# Patient Record
Sex: Female | Born: 1955 | Race: Black or African American | Hispanic: No | Marital: Single | Smoking: Never smoker
Health system: Southern US, Community
[De-identification: ages and names within clinical notes are randomized; demographics above are authoritative.]

## PROBLEM LIST (undated history)

## (undated) DIAGNOSIS — I219 Acute myocardial infarction, unspecified: Secondary | ICD-10-CM

## (undated) DIAGNOSIS — J8410 Pulmonary fibrosis, unspecified: Secondary | ICD-10-CM

## (undated) DIAGNOSIS — E785 Hyperlipidemia, unspecified: Secondary | ICD-10-CM

## (undated) DIAGNOSIS — E119 Type 2 diabetes mellitus without complications: Secondary | ICD-10-CM

## (undated) DIAGNOSIS — I272 Pulmonary hypertension, unspecified: Secondary | ICD-10-CM

## (undated) DIAGNOSIS — I2510 Atherosclerotic heart disease of native coronary artery without angina pectoris: Secondary | ICD-10-CM

## (undated) DIAGNOSIS — E669 Obesity, unspecified: Secondary | ICD-10-CM

## (undated) DIAGNOSIS — M3320 Polymyositis, organ involvement unspecified: Secondary | ICD-10-CM

## (undated) DIAGNOSIS — J449 Chronic obstructive pulmonary disease, unspecified: Secondary | ICD-10-CM

## (undated) DIAGNOSIS — I509 Heart failure, unspecified: Secondary | ICD-10-CM

## (undated) DIAGNOSIS — I10 Essential (primary) hypertension: Secondary | ICD-10-CM

## (undated) HISTORY — PX: ABDOMINAL SURGERY: SHX537

## (undated) HISTORY — PX: TRACHEOSTOMY: SUR1362

---

## 2016-06-09 DIAGNOSIS — I219 Acute myocardial infarction, unspecified: Secondary | ICD-10-CM

## 2016-06-09 HISTORY — DX: Acute myocardial infarction, unspecified: I21.9

## 2016-12-19 ENCOUNTER — Encounter: Payer: Self-pay | Admitting: Emergency Medicine

## 2016-12-19 ENCOUNTER — Emergency Department: Payer: Medicare HMO

## 2016-12-19 ENCOUNTER — Emergency Department
Admission: EM | Admit: 2016-12-19 | Discharge: 2016-12-19 | Disposition: A | Discharge status: Home / Self Care | Payer: Medicare HMO | Attending: Emergency Medicine | Admitting: Emergency Medicine

## 2016-12-19 DIAGNOSIS — R0600 Dyspnea, unspecified: Secondary | ICD-10-CM | POA: Insufficient documentation

## 2016-12-19 DIAGNOSIS — I509 Heart failure, unspecified: Secondary | ICD-10-CM | POA: Diagnosis not present

## 2016-12-19 DIAGNOSIS — E119 Type 2 diabetes mellitus without complications: Secondary | ICD-10-CM | POA: Diagnosis not present

## 2016-12-19 DIAGNOSIS — J449 Chronic obstructive pulmonary disease, unspecified: Secondary | ICD-10-CM | POA: Diagnosis not present

## 2016-12-19 DIAGNOSIS — R0602 Shortness of breath: Secondary | ICD-10-CM | POA: Diagnosis present

## 2016-12-19 DIAGNOSIS — I110 Hypertensive heart disease with heart failure: Secondary | ICD-10-CM | POA: Insufficient documentation

## 2016-12-19 HISTORY — DX: Heart failure, unspecified: I50.9

## 2016-12-19 HISTORY — DX: Acute myocardial infarction, unspecified: I21.9

## 2016-12-19 HISTORY — DX: Type 2 diabetes mellitus without complications: E11.9

## 2016-12-19 HISTORY — DX: Essential (primary) hypertension: I10

## 2016-12-19 HISTORY — DX: Chronic obstructive pulmonary disease, unspecified: J44.9

## 2016-12-19 LAB — COMPREHENSIVE METABOLIC PANEL
ALBUMIN: 3.5 g/dL (ref 3.5–5.0)
ALT: 112 U/L — ABNORMAL HIGH (ref 14–54)
AST: 163 U/L — ABNORMAL HIGH (ref 15–41)
Alkaline Phosphatase: 73 U/L (ref 38–126)
Anion gap: 10 (ref 5–15)
BUN: 19 mg/dL (ref 6–20)
CO2: 23 mmol/L (ref 22–32)
Calcium: 9.1 mg/dL (ref 8.9–10.3)
Chloride: 108 mmol/L (ref 101–111)
Creatinine, Ser: 1.16 mg/dL — ABNORMAL HIGH (ref 0.44–1.00)
GFR calc Af Amer: 59 mL/min — ABNORMAL LOW (ref >60)
GFR calc non Af Amer: 50 mL/min — ABNORMAL LOW (ref >60)
GLUCOSE: 131 mg/dL — AB (ref 65–99)
POTASSIUM: 3.2 mmol/L — AB (ref 3.5–5.1)
SODIUM: 141 mmol/L (ref 135–145)
Total Bilirubin: 0.8 mg/dL (ref 0.3–1.2)
Total Protein: 7.1 g/dL (ref 6.5–8.1)

## 2016-12-19 LAB — URINALYSIS, COMPLETE (UACMP) WITH MICROSCOPIC
BILIRUBIN URINE: NEGATIVE
Glucose, UA: NEGATIVE mg/dL
Hgb urine dipstick: NEGATIVE
KETONES UR: NEGATIVE mg/dL
Leukocytes, UA: NEGATIVE
Nitrite: NEGATIVE
Protein, ur: 30 mg/dL — AB
Specific Gravity, Urine: 1.019 (ref 1.005–1.030)
pH: 6 (ref 5.0–8.0)

## 2016-12-19 LAB — TROPONIN I: Troponin I: <0.03 ng/mL (ref <0.03)

## 2016-12-19 LAB — CBC WITH DIFFERENTIAL/PLATELET
BASOS ABS: 0.1 10*3/uL (ref 0–0.1)
Basophils Relative: 1 %
EOS ABS: 0.3 10*3/uL (ref 0–0.7)
Eosinophils Relative: 2 %
HCT: 32 % — ABNORMAL LOW (ref 35.0–47.0)
HEMOGLOBIN: 10.4 g/dL — AB (ref 12.0–16.0)
LYMPHS ABS: 0.7 10*3/uL — AB (ref 1.0–3.6)
LYMPHS PCT: 5 %
MCH: 27.8 pg (ref 26.0–34.0)
MCHC: 32.7 g/dL (ref 32.0–36.0)
MCV: 85 fL (ref 80.0–100.0)
Monocytes Absolute: 0.6 10*3/uL (ref 0.2–0.9)
Monocytes Relative: 4 %
Neutro Abs: 12.8 10*3/uL — ABNORMAL HIGH (ref 1.4–6.5)
Neutrophils Relative %: 88 %
Platelets: 414 10*3/uL (ref 150–440)
RBC: 3.76 MIL/uL — AB (ref 3.80–5.20)
RDW: 19.5 % — ABNORMAL HIGH (ref 11.5–14.5)
WBC: 14.5 10*3/uL — ABNORMAL HIGH (ref 3.6–11.0)

## 2016-12-19 LAB — BRAIN NATRIURETIC PEPTIDE: B Natriuretic Peptide: 65 pg/mL (ref 0.0–100.0)

## 2016-12-19 LAB — FIBRIN DERIVATIVES D-DIMER (ARMC ONLY): FIBRIN DERIVATIVES D-DIMER (ARMC): 1318.79 — AB (ref 0.00–499.00)

## 2016-12-19 MED ORDER — IOPAMIDOL (ISOVUE-370) INJECTION 76%
75.0000 mL | Freq: Once | INTRAVENOUS | Status: AC | PRN
  Administered 2016-12-19: 75 mL via INTRAVENOUS

## 2016-12-19 MED ORDER — IPRATROPIUM-ALBUTEROL 0.5-2.5 (3) MG/3ML IN SOLN
3.0000 mL | Freq: Once | RESPIRATORY_TRACT | Status: AC
  Administered 2016-12-19: 3 mL via RESPIRATORY_TRACT

## 2016-12-19 MED ORDER — IPRATROPIUM-ALBUTEROL 0.5-2.5 (3) MG/3ML IN SOLN
RESPIRATORY_TRACT | Status: AC
  Administered 2016-12-19: 3 mL via RESPIRATORY_TRACT
  Filled 2016-12-19: qty 3

## 2016-12-19 MED ORDER — PREDNISONE 50 MG PO TABS: 50.0000 mg | ORAL_TABLET | Freq: Every day | ORAL | 0 refills | Status: DC

## 2016-12-19 NOTE — ED Triage Notes (Addendum)
Pt to ED via EMS from home, pt is on 3L [place] chronically for COPD, pt on 4L at 88% currently. Pt has productive cough and noted weakness. Pt A&OX4, MD at bedside. Pt from [place] visiting family.

## 2016-12-19 NOTE — ED Provider Notes (Signed)
I was asked to follow-up on patient's CT scan, no PE question of interstitial lung disease, patient has outpatient pulmonary follow-up arranged. She reports she feels significantly better and wants to go home. I offered admission but she says she feels better than she has in a while, she would prefer to go home and if she feels worse she states she will come right back. I feel this is reasonable, family also agrees with this plan   Jene Every, MD 12/19/16 1820

## 2016-12-19 NOTE — ED Provider Notes (Signed)
[facility] Emergency Department Provider Note   ____________________________________________   First MD Initiated Contact with Patient 12/19/16 1420     (approximate)  I have reviewed the triage vital signs and the nursing notes.   HISTORY  Chief Complaint Shortness of Breath    HPI Elizabeth Montes is a 61 y.o. female patient reports she is on 3  liters of oxygen normally for COPD. She came up from [place] to visit family yesterday and became short of breath today. She has a productive cough and feels weak. Here in the emergency room her O2 sats are 88% on 4 L. She feels weak and ill. Patient is coughing a much sure if there is anything coming up though. Patient is very tired and isn't speaking very well or loudly  Past Medical History:  Diagnosis Date  . CHF (congestive heart failure) (HCC)   . COPD (chronic obstructive pulmonary disease) (HCC)   . Diabetes mellitus without complication (HCC)   . Heart attack (HCC)   . Hypertension     There are no active problems to display for this patient.   Past Surgical History:  Procedure Laterality Date  . ABDOMINAL SURGERY    . TRACHEOSTOMY      Prior to Admission medications   Not on File    Allergies Patient has no known allergies.  No family history on file.  Social History Social History  Substance Use Topics  . Smoking status: Never Smoker  . Smokeless tobacco: Never Used  . Alcohol use No    Review of Systems  Constitutional: No fever/chills Eyes: No visual changes. ENT: No sore throat. Cardiovascular: Denies chest pain. Respiratory:  shortness of breath. Gastrointestinal: No abdominal pain.  No nausea, no vomiting.  No diarrhea.  No constipation. Genitourinary: Negative for dysuria. Musculoskeletal: Negative for back pain. Skin: Negative for rash. Neurological: Negative for headaches, focal weakness or numbness.   ____________________________________________   PHYSICAL  EXAM:  VITAL SIGNS: ED Triage Vitals  Enc Vitals Group     BP 12/19/16 1415 116/61     Pulse Rate 12/19/16 1415 94     Resp 12/19/16 1415 (!) 26     Temp 12/19/16 1416 98.3 F (36.8 C)     Temp Source 12/19/16 1416 Oral     SpO2 12/19/16 1415 90 %     Weight --      Height --      Head Circumference --      Peak Flow --      Pain Score --      Pain Loc --      Pain Edu? --      Excl. in GC? --     Constitutional: Alert and oriented.Looks tired and ill distress. Eyes: Conjunctivae are normal.  Head: Atraumatic. Nose: No congestion/rhinnorhea. Mouth/Throat: Mucous membranes are moist.  Oropharynx non-erythematous. Neck: No stridor. Cardiovascular: Normal rate, regular rhythm. Grossly normal heart sounds.  Good peripheral circulation. Respiratory: Somewhat increased and more rapid respiratory effort.  No retractions. Lungs CTAB. Gastrointestinal: Soft and nontender. No distention. No abdominal bruits. No CVA tenderness. Musculoskeletal: No lower extremity tenderness nor edema.  No joint effusions. Neurologic:  Normal speech and language. No gross focal neurologic deficits are appreciated. No gait instability. Skin:  Skin is warm, dry and intact. No rash noted.   ____________________________________________   LABS (all labs ordered are listed, but only abnormal results are displayed)  Labs Reviewed  COMPREHENSIVE METABOLIC PANEL - Abnormal; Notable  for the following:       Result Value   Potassium 3.2 (*)    Glucose, Bld 131 (*)    Creatinine, Ser 1.16 (*)    AST 163 (*)    ALT 112 (*)    GFR calc non Af Amer 50 (*)    GFR calc Af Amer 59 (*)    All other components within normal limits  CBC WITH DIFFERENTIAL/PLATELET - Abnormal; Notable for the following:    WBC 14.5 (*)    RBC 3.76 (*)    Hemoglobin 10.4 (*)    HCT 32.0 (*)    RDW 19.5 (*)    Neutro Abs 12.8 (*)    Lymphs Abs 0.7 (*)    All other components within normal limits  FIBRIN DERIVATIVES D-DIMER  ([facility] ONLY) - Abnormal; Notable for the following:    Fibrin derivatives D-dimer ([facility]) 1,318.79 (*)    All other components within normal limits  TROPONIN I  BRAIN NATRIURETIC PEPTIDE  URINALYSIS, COMPLETE (UACMP) WITH MICROSCOPIC   ____________________________________________  EKG  EKG read and interpreted by me shows normal sinus rhythm rate of 95 normal axis nonspecific ST-T wave changes ____________________________________________  RADIOLOGY  IMPRESSION: Scarring or atelectasis in the bases.  No acute abnormalities.   Electronically Signed   By: Gerome Sam III M.D   On: 12/19/2016 14:40 ____________________________________________   PROCEDURES  Procedure(s) performed:   Procedures  Critical Care performed:   ____________________________________________   INITIAL IMPRESSION / ASSESSMENT AND PLAN / ED COURSE  Pertinent labs & imaging results that were available during my care of the patient were reviewed by me and considered in my medical decision making (see chart for details).   Dr. Cyril Loosen will follow-up the patient's CT scan and dispose appropriati     ____________________________________________   FINAL CLINICAL IMPRESSION(S) / ED DIAGNOSES  Final diagnoses:  Dyspnea  Dyspnea, unspecified type      NEW MEDICATIONS STARTED DURING THIS VISIT:  New Prescriptions   No medications on file     Note:  This document was prepared using Dragon voice recognition software and may include unintentional dictation errors.    Arnaldo Natal, MD 12/19/16 [phone]

## 2016-12-19 NOTE — ED Notes (Signed)
Pt taken to car in w/c

## 2017-07-15 ENCOUNTER — Other Ambulatory Visit: Payer: Self-pay

## 2017-07-15 ENCOUNTER — Emergency Department (HOSPITAL_COMMUNITY): Payer: Medicare HMO

## 2017-07-15 ENCOUNTER — Inpatient Hospital Stay (HOSPITAL_COMMUNITY)
Admission: EM | Admit: 2017-07-15 | Discharge: 2017-07-18 | DRG: 291 | Disposition: A | Discharge status: Home / Self Care | Payer: Medicare HMO | Attending: Internal Medicine | Admitting: Internal Medicine

## 2017-07-15 ENCOUNTER — Encounter (HOSPITAL_COMMUNITY): Payer: Self-pay | Admitting: *Deleted

## 2017-07-15 DIAGNOSIS — Z794 Long term (current) use of insulin: Secondary | ICD-10-CM

## 2017-07-15 DIAGNOSIS — I252 Old myocardial infarction: Secondary | ICD-10-CM

## 2017-07-15 DIAGNOSIS — N182 Chronic kidney disease, stage 2 (mild): Secondary | ICD-10-CM

## 2017-07-15 DIAGNOSIS — R0602 Shortness of breath: Secondary | ICD-10-CM

## 2017-07-15 DIAGNOSIS — N179 Acute kidney failure, unspecified: Secondary | ICD-10-CM | POA: Diagnosis present

## 2017-07-15 DIAGNOSIS — N183 Chronic kidney disease, stage 3 (moderate): Secondary | ICD-10-CM | POA: Diagnosis present

## 2017-07-15 DIAGNOSIS — N189 Chronic kidney disease, unspecified: Secondary | ICD-10-CM

## 2017-07-15 DIAGNOSIS — D649 Anemia, unspecified: Secondary | ICD-10-CM | POA: Diagnosis present

## 2017-07-15 DIAGNOSIS — R739 Hyperglycemia, unspecified: Secondary | ICD-10-CM

## 2017-07-15 DIAGNOSIS — R7989 Other specified abnormal findings of blood chemistry: Secondary | ICD-10-CM

## 2017-07-15 DIAGNOSIS — I130 Hypertensive heart and chronic kidney disease with heart failure and stage 1 through stage 4 chronic kidney disease, or unspecified chronic kidney disease: Principal | ICD-10-CM | POA: Diagnosis present

## 2017-07-15 DIAGNOSIS — Z9981 Dependence on supplemental oxygen: Secondary | ICD-10-CM

## 2017-07-15 DIAGNOSIS — E1165 Type 2 diabetes mellitus with hyperglycemia: Secondary | ICD-10-CM | POA: Diagnosis present

## 2017-07-15 DIAGNOSIS — J449 Chronic obstructive pulmonary disease, unspecified: Secondary | ICD-10-CM | POA: Diagnosis present

## 2017-07-15 DIAGNOSIS — Z79899 Other long term (current) drug therapy: Secondary | ICD-10-CM

## 2017-07-15 DIAGNOSIS — R0902 Hypoxemia: Secondary | ICD-10-CM

## 2017-07-15 DIAGNOSIS — E1122 Type 2 diabetes mellitus with diabetic chronic kidney disease: Secondary | ICD-10-CM | POA: Diagnosis present

## 2017-07-15 DIAGNOSIS — Z888 Allergy status to other drugs, medicaments and biological substances status: Secondary | ICD-10-CM

## 2017-07-15 DIAGNOSIS — IMO0002 Reserved for concepts with insufficient information to code with codable children: Secondary | ICD-10-CM | POA: Diagnosis present

## 2017-07-15 DIAGNOSIS — D72829 Elevated white blood cell count, unspecified: Secondary | ICD-10-CM | POA: Diagnosis present

## 2017-07-15 DIAGNOSIS — I509 Heart failure, unspecified: Secondary | ICD-10-CM | POA: Diagnosis present

## 2017-07-15 DIAGNOSIS — I10 Essential (primary) hypertension: Secondary | ICD-10-CM | POA: Diagnosis present

## 2017-07-15 DIAGNOSIS — Z7952 Long term (current) use of systemic steroids: Secondary | ICD-10-CM

## 2017-07-15 DIAGNOSIS — M3320 Polymyositis, organ involvement unspecified: Secondary | ICD-10-CM | POA: Diagnosis present

## 2017-07-15 DIAGNOSIS — N289 Disorder of kidney and ureter, unspecified: Secondary | ICD-10-CM

## 2017-07-15 DIAGNOSIS — J9621 Acute and chronic respiratory failure with hypoxia: Secondary | ICD-10-CM | POA: Diagnosis present

## 2017-07-15 DIAGNOSIS — Z7982 Long term (current) use of aspirin: Secondary | ICD-10-CM

## 2017-07-15 MED ORDER — FUROSEMIDE 10 MG/ML IJ SOLN
80.0000 mg | Freq: Once | INTRAMUSCULAR | Status: AC
  Administered 2017-07-16: 80 mg via INTRAVENOUS
  Filled 2017-07-15: qty 8

## 2017-07-15 NOTE — ED Provider Notes (Signed)
[facility]ENC[facility] Provider Note   CSN: [id number] Arrival date & time: 07/15/17  2228     History   Chief Complaint Chief Complaint  Patient presents with  . Shortness of Breath    HPI Elizabeth Montes is a 61 y.o. female.  The history is provided by the patient.  She has history of diabetes, CHF, COPD, hypertension, prior MI.  She is visiting from [place] and states that this evening, her oxygen tank stopped working and she developed some shortness of breath.  She has been having a nonproductive cough which is not changed.  For the last several days, she has been having some chest tightness which she cannot clearly related to exertion.  Family member with her states that her breathing seemed fine until the oxygen stopped working [employer].  EMS reported severe hypoxia with O2 saturation of 65% which needed a nonrebreather to come up to normal.  EMS did give her a nebulizer treatment with albuterol and ipratropium and patient states she does feel little bit better.  Past Medical History:  Diagnosis Date  . CHF (congestive heart failure) (HCC)   . COPD (chronic obstructive pulmonary disease) (HCC)   . Diabetes mellitus without complication (HCC)   . Heart attack (HCC)   . Hypertension     There are no active problems to display for this patient.   Past Surgical History:  Procedure Laterality Date  . ABDOMINAL SURGERY    . TRACHEOSTOMY      OB History    No data available       Home Medications    Prior to Admission medications   Medication Sig Start Date End Date Taking? Authorizing Provider  predniSONE (DELTASONE) 50 MG tablet Take 1 tablet (50 mg total) by mouth daily with breakfast. 12/19/16   Jene EveryKinner, Robert, MD    Family History No family history on file.  Social History Social History   Tobacco Use  . Smoking status: Never Smoker  . Smokeless tobacco: Never Used  Substance Use Topics  . Alcohol use: No  . Drug use: No      Allergies   Patient has no known allergies.   Review of Systems Review of Systems  All other systems reviewed and are negative.    Physical Exam Updated Vital Signs BP (!) 136/51   Resp (!) 22   SpO2 94%   Physical Exam  Nursing note and vitals reviewed.  61 year old female, resting comfortably and in no acute distress. Vital signs are significant for tachycardia and tachypnea. Oxygen saturation is 94%, which is normal, but dropped down to 90% when placed back on nasal cannula oxygen. Head is normocephalic and atraumatic. PERRLA, EOMI. Oropharynx is clear. Neck is nontender and supple without adenopathy or JVD. Back is nontender and there is no CVA tenderness. Lungs have bibasilar rales without wheezes or rhonchi. Chest is nontender. Heart has regular rate and rhythm without murmur. Abdomen is soft, flat, nontender without masses or hepatosplenomegaly and peristalsis is normoactive. Extremities have 2+ pretibial edema with 1-2+ presacral edema, full range of motion is present. Skin is warm and dry without rash. Neurologic: Mental status is normal, cranial nerves are intact, there are no motor or sensory deficits.  ED Treatments / Results  Labs (all labs ordered are listed, but only abnormal results are displayed) Labs Reviewed  COMPREHENSIVE METABOLIC PANEL - Abnormal; Notable for the following components:      Result Value   Sodium 130 (*)  Chloride 97 (*)    CO2 21 (*)    Glucose, Bld 833 (*)    BUN 39 (*)    Creatinine, Ser 1.68 (*)    Albumin 3.3 (*)    GFR calc non Af Amer 32 (*)    GFR calc Af Amer 37 (*)    All other components within normal limits  BRAIN NATRIURETIC PEPTIDE - Abnormal; Notable for the following components:   B Natriuretic Peptide 138.1 (*)    All other components within normal limits  CBC WITH DIFFERENTIAL/PLATELET - Abnormal; Notable for the following components:   WBC 16.9 (*)    RBC 3.21 (*)    Hemoglobin 10.1 (*)    HCT 30.1  (*)    RDW 21.4 (*)    Platelets 448 (*)    Neutro Abs 12.8 (*)    Monocytes Absolute 1.2 (*)    All other components within normal limits  I-STAT TROPONIN, ED  I-STAT VENOUS BLOOD GAS, ED    EKG  EKG Interpretation  Date/Time:  Thursday July 15 2017 22:42:10 EST Ventricular Rate:  124 PR Interval:    QRS Duration: 93 QT Interval:  353 QTC Calculation: 507 R Axis:   7 Text Interpretation:  Sinus tachycardia Borderline ST depression, lateral leads Borderline prolonged QT interval When compared with ECG of 01/11/1996, HEART RATE has increased QT has lengthened Confirmed by Dione Booze ([id number]) on 07/15/2017 10:57:10 PM       Radiology Dg Chest 2 View  Result Date: 07/15/2017 CLINICAL DATA:  Shortness of breath and cough EXAM: CHEST  2 VIEW COMPARISON:  CTA chest 12/19/2016 FINDINGS: Diffuse interstitial prominence. No pleural effusion or pneumothorax. Bibasilar atelectasis. Normal cardiomediastinal contours. The tip of the left PICC line is at the cavoatrial junction. IMPRESSION: Chronic findings of COPD without focal airspace consolidation. Electronically Signed   By: Deatra Robinson M.D.   On: 07/15/2017 23:48    Procedures Procedures (including critical care time) CRITICAL CARE Performed by: Dione Booze Total critical care time: 35 minutes Critical care time was exclusive of separately billable procedures and treating other patients. Critical care was necessary to treat or prevent imminent or life-threatening deterioration. Critical care was time spent personally by me on the following activities: development of treatment plan with patient and/or surrogate as well as nursing, discussions with consultants, evaluation of patient's response to treatment, examination of patient, obtaining history from patient or surrogate, ordering and performing treatments and interventions, ordering and review of laboratory studies, ordering and review of radiographic studies, pulse oximetry  and re-evaluation of patient's condition.  Medications Ordered in ED Medications - No data to display   Initial Impression / Assessment and Plan / ED Course  I have reviewed the triage vital signs and the nursing notes.  Pertinent labs & imaging results that were available during my care of the patient were reviewed by me and considered in my medical decision making (see chart for details).  Acute dyspnea which appears to be combination of CHF and COPD.  My exam, CHF is predominant, but EMS did report wheezes and she did have some subjective improvement with nebulizer treatment in route.  Old records are reviewed, and she has 1 prior ED visit with similar presentation.  Will check chest x-ray to rule out pneumonia and also will check BNP.  She is given a dose of intravenous furosemide.  Laboratory workup shows severe hyperglycemia with mild metabolic acidosis and no increased anion gap.  Venous blood gas has been  ordered.  She is started on glucose stabilizer.  BNP is mildly elevated.  Because she is already fluid overloaded, IV fluids are not used as part of treatment of hyperglycemia.  With combination of hypoxia, hyperglycemia, and acute kidney injury, it is felt that she needed inpatient management.  Case is discussed with Dr. Antionette Char of [facility], who agrees to admit the patient.  Final Clinical Impressions(s) / ED Diagnoses   Final diagnoses:  Shortness of breath  Hypoxia  Acute on chronic renal insufficiency  Elevated brain natriuretic peptide (BNP) level  Hyperglycemia    ED Discharge Orders    None       Dione Booze, MD 07/16/17 Earle Gell

## 2017-07-15 NOTE — ED Notes (Signed)
IV team at bedside 

## 2017-07-15 NOTE — ED Triage Notes (Signed)
Pt from home c/o sob after walking upstairs. Initial oxygen sats 68% on room air, increased to 72% on [place], then increased to 96% on NRB; R sided lung sounds diminished, L sided lung sounds clear with wheezing. Hx of CHF, currently taking lasix. EMS gave 10mg  albuterol and 1mg  atrovent given enroute. On arrival. Pt ambulated with NRB in placed, pt significantly labored after ambulating with sats 75%.

## 2017-07-15 NOTE — ED Notes (Signed)
EMS attempted IV x3; port to upper L arm

## 2017-07-15 NOTE — ED Notes (Signed)
Pt requesting IV team   

## 2017-07-16 ENCOUNTER — Other Ambulatory Visit: Payer: Self-pay

## 2017-07-16 ENCOUNTER — Encounter (HOSPITAL_COMMUNITY): Payer: Self-pay | Admitting: Family Medicine

## 2017-07-16 ENCOUNTER — Inpatient Hospital Stay (HOSPITAL_COMMUNITY): Payer: Medicare HMO

## 2017-07-16 DIAGNOSIS — I252 Old myocardial infarction: Secondary | ICD-10-CM | POA: Diagnosis not present

## 2017-07-16 DIAGNOSIS — E1100 Type 2 diabetes mellitus with hyperosmolarity without nonketotic hyperglycemic-hyperosmolar coma (NKHHC): Secondary | ICD-10-CM

## 2017-07-16 DIAGNOSIS — J449 Chronic obstructive pulmonary disease, unspecified: Secondary | ICD-10-CM

## 2017-07-16 DIAGNOSIS — R0600 Dyspnea, unspecified: Secondary | ICD-10-CM | POA: Diagnosis not present

## 2017-07-16 DIAGNOSIS — R739 Hyperglycemia, unspecified: Secondary | ICD-10-CM | POA: Diagnosis not present

## 2017-07-16 DIAGNOSIS — D649 Anemia, unspecified: Secondary | ICD-10-CM | POA: Diagnosis present

## 2017-07-16 DIAGNOSIS — M3320 Polymyositis, organ involvement unspecified: Secondary | ICD-10-CM | POA: Diagnosis present

## 2017-07-16 DIAGNOSIS — I10 Essential (primary) hypertension: Secondary | ICD-10-CM | POA: Diagnosis not present

## 2017-07-16 DIAGNOSIS — Z888 Allergy status to other drugs, medicaments and biological substances status: Secondary | ICD-10-CM | POA: Diagnosis not present

## 2017-07-16 DIAGNOSIS — N289 Disorder of kidney and ureter, unspecified: Secondary | ICD-10-CM | POA: Diagnosis not present

## 2017-07-16 DIAGNOSIS — D72829 Elevated white blood cell count, unspecified: Secondary | ICD-10-CM | POA: Diagnosis present

## 2017-07-16 DIAGNOSIS — Z9981 Dependence on supplemental oxygen: Secondary | ICD-10-CM | POA: Diagnosis not present

## 2017-07-16 DIAGNOSIS — R0602 Shortness of breath: Secondary | ICD-10-CM | POA: Diagnosis not present

## 2017-07-16 DIAGNOSIS — N189 Chronic kidney disease, unspecified: Secondary | ICD-10-CM | POA: Diagnosis not present

## 2017-07-16 DIAGNOSIS — Z794 Long term (current) use of insulin: Secondary | ICD-10-CM | POA: Diagnosis not present

## 2017-07-16 DIAGNOSIS — I509 Heart failure, unspecified: Secondary | ICD-10-CM | POA: Diagnosis present

## 2017-07-16 DIAGNOSIS — I5033 Acute on chronic diastolic (congestive) heart failure: Secondary | ICD-10-CM | POA: Diagnosis not present

## 2017-07-16 DIAGNOSIS — Z79899 Other long term (current) drug therapy: Secondary | ICD-10-CM | POA: Diagnosis not present

## 2017-07-16 DIAGNOSIS — R0902 Hypoxemia: Secondary | ICD-10-CM | POA: Diagnosis not present

## 2017-07-16 DIAGNOSIS — N183 Chronic kidney disease, stage 3 (moderate): Secondary | ICD-10-CM | POA: Diagnosis present

## 2017-07-16 DIAGNOSIS — I130 Hypertensive heart and chronic kidney disease with heart failure and stage 1 through stage 4 chronic kidney disease, or unspecified chronic kidney disease: Secondary | ICD-10-CM | POA: Diagnosis present

## 2017-07-16 DIAGNOSIS — E1122 Type 2 diabetes mellitus with diabetic chronic kidney disease: Secondary | ICD-10-CM | POA: Diagnosis present

## 2017-07-16 DIAGNOSIS — N182 Chronic kidney disease, stage 2 (mild): Secondary | ICD-10-CM | POA: Diagnosis not present

## 2017-07-16 DIAGNOSIS — Z7982 Long term (current) use of aspirin: Secondary | ICD-10-CM | POA: Diagnosis not present

## 2017-07-16 DIAGNOSIS — J9621 Acute and chronic respiratory failure with hypoxia: Secondary | ICD-10-CM

## 2017-07-16 DIAGNOSIS — Z7952 Long term (current) use of systemic steroids: Secondary | ICD-10-CM | POA: Diagnosis not present

## 2017-07-16 DIAGNOSIS — IMO0002 Reserved for concepts with insufficient information to code with codable children: Secondary | ICD-10-CM | POA: Diagnosis present

## 2017-07-16 DIAGNOSIS — E1165 Type 2 diabetes mellitus with hyperglycemia: Secondary | ICD-10-CM | POA: Diagnosis present

## 2017-07-16 DIAGNOSIS — N179 Acute kidney failure, unspecified: Secondary | ICD-10-CM | POA: Diagnosis present

## 2017-07-16 DIAGNOSIS — R7989 Other specified abnormal findings of blood chemistry: Secondary | ICD-10-CM | POA: Diagnosis not present

## 2017-07-16 LAB — BASIC METABOLIC PANEL
ANION GAP: 14 (ref 5–15)
ANION GAP: 12 (ref 5–15)
Anion gap: 11 (ref 5–15)
Anion gap: 13 (ref 5–15)
BUN: 40 mg/dL — ABNORMAL HIGH (ref 6–20)
BUN: 35 mg/dL — ABNORMAL HIGH (ref 6–20)
BUN: 35 mg/dL — ABNORMAL HIGH (ref 6–20)
BUN: 34 mg/dL — ABNORMAL HIGH (ref 6–20)
CALCIUM: 9.4 mg/dL (ref 8.9–10.3)
CALCIUM: 9.4 mg/dL (ref 8.9–10.3)
CHLORIDE: 96 mmol/L — AB (ref 101–111)
CHLORIDE: 100 mmol/L — AB (ref 101–111)
CHLORIDE: 103 mmol/L (ref 101–111)
CHLORIDE: 101 mmol/L (ref 101–111)
CO2: 22 mmol/L (ref 22–32)
CO2: 24 mmol/L (ref 22–32)
CO2: 20 mmol/L — ABNORMAL LOW (ref 22–32)
CO2: 23 mmol/L (ref 22–32)
CREATININE: 1.52 mg/dL — AB (ref 0.44–1.00)
CREATININE: 1.29 mg/dL — AB (ref 0.44–1.00)
CREATININE: 1.33 mg/dL — AB (ref 0.44–1.00)
CREATININE: 1.36 mg/dL — AB (ref 0.44–1.00)
Calcium: 9.1 mg/dL (ref 8.9–10.3)
Calcium: 9.1 mg/dL (ref 8.9–10.3)
GFR calc non Af Amer: 36 mL/min — ABNORMAL LOW (ref >60)
GFR calc non Af Amer: 44 mL/min — ABNORMAL LOW (ref >60)
GFR calc non Af Amer: 43 mL/min — ABNORMAL LOW (ref >60)
GFR calc non Af Amer: 42 mL/min — ABNORMAL LOW (ref >60)
GFR, EST AFRICAN AMERICAN: 42 mL/min — AB (ref >60)
GFR, EST AFRICAN AMERICAN: 51 mL/min — AB (ref >60)
GFR, EST AFRICAN AMERICAN: 50 mL/min — AB (ref >60)
GFR, EST AFRICAN AMERICAN: 48 mL/min — AB (ref >60)
Glucose, Bld: 683 mg/dL (ref 65–99)
Glucose, Bld: 337 mg/dL — ABNORMAL HIGH (ref 65–99)
Glucose, Bld: 252 mg/dL — ABNORMAL HIGH (ref 65–99)
Glucose, Bld: 140 mg/dL — ABNORMAL HIGH (ref 65–99)
Potassium: 3.8 mmol/L (ref 3.5–5.1)
Potassium: 3.6 mmol/L (ref 3.5–5.1)
Potassium: 4.8 mmol/L (ref 3.5–5.1)
Potassium: 4.1 mmol/L (ref 3.5–5.1)
SODIUM: 132 mmol/L — AB (ref 135–145)
SODIUM: 135 mmol/L (ref 135–145)
SODIUM: 136 mmol/L (ref 135–145)
SODIUM: 136 mmol/L (ref 135–145)

## 2017-07-16 LAB — CBG MONITORING, ED
GLUCOSE-CAPILLARY: 501 mg/dL — AB (ref 65–99)
GLUCOSE-CAPILLARY: 341 mg/dL — AB (ref 65–99)
GLUCOSE-CAPILLARY: 193 mg/dL — AB (ref 65–99)
GLUCOSE-CAPILLARY: 390 mg/dL — AB (ref 65–99)
GLUCOSE-CAPILLARY: 330 mg/dL — AB (ref 65–99)
Glucose-Capillary: >600 mg/dL (ref 65–99)
Glucose-Capillary: >600 mg/dL (ref 65–99)
Glucose-Capillary: 501 mg/dL (ref 65–99)
Glucose-Capillary: 412 mg/dL — ABNORMAL HIGH (ref 65–99)
Glucose-Capillary: 249 mg/dL — ABNORMAL HIGH (ref 65–99)
Glucose-Capillary: 195 mg/dL — ABNORMAL HIGH (ref 65–99)

## 2017-07-16 LAB — URINALYSIS, ROUTINE W REFLEX MICROSCOPIC
Bilirubin Urine: NEGATIVE
HGB URINE DIPSTICK: NEGATIVE
Ketones, ur: NEGATIVE mg/dL
Leukocytes, UA: NEGATIVE
Nitrite: NEGATIVE
Protein, ur: NEGATIVE mg/dL
SPECIFIC GRAVITY, URINE: 1.01 (ref 1.005–1.030)
pH: 5 (ref 5.0–8.0)

## 2017-07-16 LAB — I-STAT TROPONIN, ED: TROPONIN I, POC: 0.01 ng/mL (ref 0.00–0.08)

## 2017-07-16 LAB — CBC WITH DIFFERENTIAL/PLATELET
BASOS ABS: 0 10*3/uL (ref 0.0–0.1)
Basophils Relative: 0 %
EOS PCT: 0 %
Eosinophils Absolute: 0 10*3/uL (ref 0.0–0.7)
HEMATOCRIT: 30.1 % — AB (ref 36.0–46.0)
HEMOGLOBIN: 10.1 g/dL — AB (ref 12.0–15.0)
LYMPHS ABS: 2.9 10*3/uL (ref 0.7–4.0)
LYMPHS PCT: 17 %
MCH: 31.5 pg (ref 26.0–34.0)
MCHC: 33.6 g/dL (ref 30.0–36.0)
MCV: 93.8 fL (ref 78.0–100.0)
MONOS PCT: 7 %
Monocytes Absolute: 1.2 10*3/uL — ABNORMAL HIGH (ref 0.1–1.0)
NEUTROS ABS: 12.8 10*3/uL — AB (ref 1.7–7.7)
Neutrophils Relative %: 76 %
Platelets: 448 10*3/uL — ABNORMAL HIGH (ref 150–400)
RBC: 3.21 MIL/uL — ABNORMAL LOW (ref 3.87–5.11)
RDW: 21.4 % — AB (ref 11.5–15.5)
WBC: 16.9 10*3/uL — ABNORMAL HIGH (ref 4.0–10.5)

## 2017-07-16 LAB — COMPREHENSIVE METABOLIC PANEL
ALBUMIN: 3.3 g/dL — AB (ref 3.5–5.0)
ALT: 45 U/L (ref 14–54)
AST: 38 U/L (ref 15–41)
Alkaline Phosphatase: 69 U/L (ref 38–126)
Anion gap: 12 (ref 5–15)
BUN: 39 mg/dL — AB (ref 6–20)
CHLORIDE: 97 mmol/L — AB (ref 101–111)
CO2: 21 mmol/L — AB (ref 22–32)
CREATININE: 1.68 mg/dL — AB (ref 0.44–1.00)
Calcium: 9.2 mg/dL (ref 8.9–10.3)
GFR calc Af Amer: 37 mL/min — ABNORMAL LOW (ref >60)
GFR calc non Af Amer: 32 mL/min — ABNORMAL LOW (ref >60)
GLUCOSE: 833 mg/dL — AB (ref 65–99)
POTASSIUM: 4 mmol/L (ref 3.5–5.1)
SODIUM: 130 mmol/L — AB (ref 135–145)
Total Bilirubin: 0.8 mg/dL (ref 0.3–1.2)
Total Protein: 6.8 g/dL (ref 6.5–8.1)

## 2017-07-16 LAB — I-STAT VENOUS BLOOD GAS, ED
Bicarbonate: 27.1 mmol/L (ref 20.0–28.0)
O2 SAT: 78 %
PCO2 VEN: 52.5 mmHg (ref 44.0–60.0)
PO2 VEN: 47 mmHg — AB (ref 32.0–45.0)
TCO2: 29 mmol/L (ref 22–32)
pH, Ven: 7.321 (ref 7.250–7.430)

## 2017-07-16 LAB — BRAIN NATRIURETIC PEPTIDE: B Natriuretic Peptide: 138.1 pg/mL — ABNORMAL HIGH (ref 0.0–100.0)

## 2017-07-16 LAB — GLUCOSE, CAPILLARY
GLUCOSE-CAPILLARY: 142 mg/dL — AB (ref 65–99)
GLUCOSE-CAPILLARY: 270 mg/dL — AB (ref 65–99)
Glucose-Capillary: 115 mg/dL — ABNORMAL HIGH (ref 65–99)
Glucose-Capillary: 123 mg/dL — ABNORMAL HIGH (ref 65–99)
Glucose-Capillary: 219 mg/dL — ABNORMAL HIGH (ref 65–99)
Glucose-Capillary: 232 mg/dL — ABNORMAL HIGH (ref 65–99)
Glucose-Capillary: 291 mg/dL — ABNORMAL HIGH (ref 65–99)

## 2017-07-16 LAB — ECHOCARDIOGRAM COMPLETE
Height: 63 in
WEIGHTICAEL: 2928 [oz_av]

## 2017-07-16 LAB — URINALYSIS, MICROSCOPIC (REFLEX): RBC / HPF: NONE SEEN RBC/hpf (ref 0–5)

## 2017-07-16 LAB — HIV ANTIBODY (ROUTINE TESTING W REFLEX): HIV Screen 4th Generation wRfx: NONREACTIVE

## 2017-07-16 LAB — MRSA PCR SCREENING: MRSA by PCR: POSITIVE — AB

## 2017-07-16 LAB — MAGNESIUM: MAGNESIUM: 2 mg/dL (ref 1.7–2.4)

## 2017-07-16 MED ORDER — METOPROLOL TARTRATE 25 MG PO TABS
25.0000 mg | ORAL_TABLET | Freq: Two times a day (BID) | ORAL | Status: DC
  Administered 2017-07-16 – 2017-07-18 (×5): 25 mg via ORAL
  Filled 2017-07-16 (×5): qty 1

## 2017-07-16 MED ORDER — SODIUM CHLORIDE 0.9% FLUSH: 10.0000 mL | INTRAVENOUS | Status: DC | PRN

## 2017-07-16 MED ORDER — ONDANSETRON HCL 4 MG/2ML IJ SOLN: 4.0000 mg | Freq: Four times a day (QID) | INTRAMUSCULAR | Status: DC | PRN

## 2017-07-16 MED ORDER — SODIUM CHLORIDE 0.9 % IV SOLN
INTRAVENOUS | Status: DC
  Administered 2017-07-16: 5.4 [IU]/h via INTRAVENOUS
  Administered 2017-07-16: 4.4 [IU]/h via INTRAVENOUS
  Administered 2017-07-16: 2.8 [IU]/h via INTRAVENOUS
  Filled 2017-07-16 (×3): qty 1

## 2017-07-16 MED ORDER — FUROSEMIDE 10 MG/ML IJ SOLN
80.0000 mg | Freq: Two times a day (BID) | INTRAMUSCULAR | Status: DC
  Administered 2017-07-16 – 2017-07-17 (×3): 80 mg via INTRAVENOUS
  Filled 2017-07-16 (×3): qty 8

## 2017-07-16 MED ORDER — IPRATROPIUM-ALBUTEROL 0.5-2.5 (3) MG/3ML IN SOLN
3.0000 mL | Freq: Four times a day (QID) | RESPIRATORY_TRACT | Status: DC
  Administered 2017-07-16 – 2017-07-17 (×5): 3 mL via RESPIRATORY_TRACT
  Filled 2017-07-16 (×5): qty 3

## 2017-07-16 MED ORDER — POTASSIUM CHLORIDE 10 MEQ/100ML IV SOLN
10.0000 meq | INTRAVENOUS | Status: AC
  Administered 2017-07-16 (×2): 10 meq via INTRAVENOUS
  Filled 2017-07-16 (×2): qty 100

## 2017-07-16 MED ORDER — SODIUM CHLORIDE 0.9 % IV SOLN
INTRAVENOUS | Status: DC
  Filled 2017-07-16: qty 1

## 2017-07-16 MED ORDER — AZATHIOPRINE 50 MG PO TABS
25.0000 mg | ORAL_TABLET | Freq: Every day | ORAL | Status: DC
  Administered 2017-07-17 – 2017-07-18 (×2): 25 mg via ORAL
  Filled 2017-07-16 (×2): qty 1

## 2017-07-16 MED ORDER — OXYCODONE-ACETAMINOPHEN 5-325 MG PO TABS
1.0000 | ORAL_TABLET | ORAL | Status: DC | PRN
  Administered 2017-07-16 – 2017-07-18 (×4): 1 via ORAL
  Filled 2017-07-16 (×4): qty 1

## 2017-07-16 MED ORDER — MUPIROCIN 2 % EX OINT
1.0000 "application " | TOPICAL_OINTMENT | Freq: Two times a day (BID) | CUTANEOUS | Status: DC
  Administered 2017-07-16 – 2017-07-18 (×4): 1 via NASAL
  Filled 2017-07-16: qty 22

## 2017-07-16 MED ORDER — SODIUM CHLORIDE 0.9% FLUSH
3.0000 mL | Freq: Two times a day (BID) | INTRAVENOUS | Status: DC
  Administered 2017-07-16: 3 mL via INTRAVENOUS
  Administered 2017-07-17: 09:00:00 via INTRAVENOUS
  Administered 2017-07-17 – 2017-07-18 (×2): 3 mL via INTRAVENOUS

## 2017-07-16 MED ORDER — TAMSULOSIN HCL 0.4 MG PO CAPS
0.4000 mg | ORAL_CAPSULE | Freq: Every day | ORAL | Status: DC
  Administered 2017-07-16 – 2017-07-17 (×2): 0.4 mg via ORAL
  Filled 2017-07-16 (×2): qty 1

## 2017-07-16 MED ORDER — ATORVASTATIN CALCIUM 80 MG PO TABS
80.0000 mg | ORAL_TABLET | Freq: Every day | ORAL | Status: DC
  Administered 2017-07-16 – 2017-07-18 (×3): 80 mg via ORAL
  Filled 2017-07-16 (×5): qty 1

## 2017-07-16 MED ORDER — FENOFIBRATE 160 MG PO TABS
160.0000 mg | ORAL_TABLET | Freq: Every day | ORAL | Status: DC
  Filled 2017-07-16 (×2): qty 1

## 2017-07-16 MED ORDER — AMLODIPINE BESYLATE 10 MG PO TABS
10.0000 mg | ORAL_TABLET | Freq: Every day | ORAL | Status: DC
  Administered 2017-07-16 – 2017-07-18 (×3): 10 mg via ORAL
  Filled 2017-07-16: qty 2
  Filled 2017-07-16 (×2): qty 1

## 2017-07-16 MED ORDER — CHLORHEXIDINE GLUCONATE CLOTH 2 % EX PADS: 6.0000 | MEDICATED_PAD | Freq: Every day | CUTANEOUS | Status: DC

## 2017-07-16 MED ORDER — ACETAMINOPHEN 325 MG PO TABS: 650.0000 mg | ORAL_TABLET | ORAL | Status: DC | PRN

## 2017-07-16 MED ORDER — ASPIRIN EC 81 MG PO TBEC
81.0000 mg | DELAYED_RELEASE_TABLET | Freq: Every day | ORAL | Status: DC
  Administered 2017-07-16 – 2017-07-18 (×3): 81 mg via ORAL
  Filled 2017-07-16 (×3): qty 1

## 2017-07-16 MED ORDER — PREDNISONE 20 MG PO TABS
40.0000 mg | ORAL_TABLET | Freq: Every day | ORAL | Status: DC
  Administered 2017-07-16 – 2017-07-17 (×2): 40 mg via ORAL
  Filled 2017-07-16 (×2): qty 2

## 2017-07-16 MED ORDER — FENOFIBRATE 160 MG PO TABS
160.0000 mg | ORAL_TABLET | Freq: Every day | ORAL | Status: DC
  Administered 2017-07-17 – 2017-07-18 (×2): 160 mg via ORAL
  Filled 2017-07-16 (×2): qty 1

## 2017-07-16 MED ORDER — SODIUM CHLORIDE 0.9 % IV SOLN
INTRAVENOUS | Status: DC
  Administered 2017-07-16: 05:00:00 via INTRAVENOUS

## 2017-07-16 MED ORDER — SODIUM CHLORIDE 0.9 % IV SOLN: 250.0000 mL | INTRAVENOUS | Status: DC | PRN

## 2017-07-16 MED ORDER — SODIUM CHLORIDE 0.9% FLUSH: 3.0000 mL | INTRAVENOUS | Status: DC | PRN

## 2017-07-16 MED ORDER — METOCLOPRAMIDE HCL 5 MG PO TABS
5.0000 mg | ORAL_TABLET | Freq: Three times a day (TID) | ORAL | Status: DC
  Administered 2017-07-16 – 2017-07-18 (×8): 5 mg via ORAL
  Filled 2017-07-16 (×8): qty 1

## 2017-07-16 MED ORDER — DEXTROSE-NACL 5-0.45 % IV SOLN
INTRAVENOUS | Status: DC
  Administered 2017-07-16: 12:00:00 via INTRAVENOUS

## 2017-07-16 MED ORDER — AZATHIOPRINE 50 MG PO TABS
25.0000 mg | ORAL_TABLET | Freq: Every day | ORAL | Status: DC
  Filled 2017-07-16 (×2): qty 1

## 2017-07-16 NOTE — ED Notes (Signed)
Contact daughter: Karen Kays @ [phone] with any concerns.

## 2017-07-16 NOTE — ED Notes (Signed)
Heart Healthy/Carb Modified diet breakfast tray ordered @ 0727.

## 2017-07-16 NOTE — Progress Notes (Signed)
  Echocardiogram 2D Echocardiogram has been performed.  Elizabeth Montes T Elizabeth Montes 07/16/2017, 1:25 PM

## 2017-07-16 NOTE — ED Notes (Signed)
Pt's CBG 193.  Informed Crystal, RN.

## 2017-07-16 NOTE — H&P (Signed)
History and Physical    Elizabeth ChannelLinda F Mood XLK:[id number] DOB: 04/10/1956 DOA: 07/15/2017  PCP: Gabriel RainwaterWendi Lovenvirth, MD [place], [place])   Patient coming from: Home  Chief Complaint: SOB, hypoxia   HPI: Elizabeth Montes is a 61 y.o. 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  [place] from [place] she was admitted to the hospital 2 weeks ago for acute on chronic hypoxic respiratory failure suspected secondary to exacerbation and COPD.  She reports that she was well and was walking up some stairs when she became acutely dyspneic EMS was called, found the patient to be saturating 68%.  This improved to 96% with nonrebreather and she was treated with DuoNeb in route to the hospital.  Patient reports that she was doing well until her oxygen tank stopped working.  She requires 5 L/min of supplemental oxygen around-the-clock at home.  Denies any significant chest pain, increase in her chronic cough, or increased sputum production.  Denies any fevers or chills.  Denies lower extremity swelling or tenderness.  ED Course: Upon arrival to the ED, patient is found to be saturating 90% on nonrebreather, tachypneic with 27 breaths/min, tachycardic in the 120s, and with stable blood pressure.  EKG features a sinus tachycardia with rate 124 and QTc interval of 507 ms.  Chest x-ray is notable for chronic changes of COPD without focal consolidation.  Chemistry panel reveals a glucose of 833, sodium 130, bicarbonate 21, and creatinine 1.68, up from 1.15 two weeks ago.  CBC is notable for leukocytosis to 16,900 and a normocytic anemia with hemoglobin of 10.1, down from 13.5 earlier this month.  Troponin is within the normal limits and BNP is slightly elevated 238.  Patient was started on insulin infusion and treated with 80 mg IV Lasix in  the she remains slightly tachypneic and tachycardic, but seems to be improving and blood pressure remained stable.  She will be admitted to the stepdown unit for ongoing evaluation and management of acute on chronic hypoxic respiratory failure, suspected secondary to acute on chronic CHF, as well as insulin dependent diabetes with HHS.  Review of Systems:  All other systems reviewed and apart from HPI, are negative.  Past Medical History:  Diagnosis Date  . CHF (congestive heart failure) (HCC)   . COPD (chronic obstructive pulmonary disease) (HCC)   . Diabetes mellitus without complication (HCC)   . Heart attack (HCC)   . Hypertension     Past Surgical History:  Procedure Laterality Date  . ABDOMINAL SURGERY    . TRACHEOSTOMY       reports that  has never smoked. she has never used smokeless tobacco. She reports that she does not drink alcohol or use drugs.  Allergies  Allergen Reactions  . Lovenox [Enoxaparin Sodium] Other (See Comments)    "made me bleed"    History reviewed. No pertinent family history.   Prior to Admission medications   Medication Sig Start Date End Date Taking? Authorizing Provider  amLODipine (NORVASC) 10 MG tablet Take 10 mg by mouth daily.   Yes [provider]  aspirin EC 81 MG tablet Take 81 mg by mouth daily.   Yes [provider]  atorvastatin (LIPITOR) 80 MG tablet Take 80 mg by mouth daily.   Yes [provider]  azaTHIOprine (IMURAN) 50 MG tablet Take 25 mg by mouth daily.  Yes [provider]  fenofibrate 160 MG tablet Take 160 mg by mouth daily.   Yes [provider]  furosemide (LASIX) 80 MG tablet Take 80 mg by mouth daily.   Yes [provider]  insulin aspart (NOVOLOG FLEXPEN) 100 UNIT/ML FlexPen Inject 25 Units into the skin 3 (three) times daily with meals.   Yes [provider]  Insulin Glargine (TOUJEO SOLOSTAR) 300 UNIT/ML SOPN Inject 30 Units into the skin every morning.    Yes [provider]  metoCLOPramide (REGLAN) 10 MG tablet Take 5 mg by mouth 3 (three) times daily before meals.   Yes [provider]  metoprolol tartrate (LOPRESSOR) 25 MG tablet Take 25 mg by mouth 2 (two) times daily.   Yes [provider]  oxyCODONE-acetaminophen (PERCOCET/ROXICET) 5-325 MG tablet Take 1 tablet by mouth every 4 (four) hours as needed for severe pain.   Yes [provider]  predniSONE (DELTASONE) 50 MG tablet Take 1 tablet (50 mg total) by mouth daily with breakfast. Patient taking differently: Take 40 mg by mouth daily with breakfast.  12/19/16  Yes Jene Every, MD  tamsulosin (FLOMAX) 0.4 MG CAPS capsule Take 0.4 mg by mouth daily after supper.   Yes [provider]  tiotropium (SPIRIVA HANDIHALER) 18 MCG inhalation capsule Place 18 mcg into inhaler and inhale daily.   Yes [provider]    Physical Exam: Vitals:   07/16/17 0000 07/16/17 0015 07/16/17 0030 07/16/17 0100  BP: (!) 134/49 (!) 118/49 (!) 136/57 124/60  Pulse: (!) 112 (!) 114 (!) 109 (!) 109  Resp: (!) 28 19 (!) 21 (!) 27  SpO2: 100% 94% 100% 100%      Constitutional: Mild tachypnea, dyspnea with speech, calm  Eyes: PERTLA, lids and conjunctivae normal ENMT: Mucous membranes are moist. Posterior pharynx clear of any exudate or lesions.   Neck: normal, supple, no masses, no thyromegaly Respiratory: Breath sounds diminished bilaterally with fine rales on right > left. Mild dyspnea with speech. No accessory muscle use.  Cardiovascular: Rate ~110 and regular. No extremity edema. JVP 9 mmH2O. Abdomen: No distension, no tenderness, no masses palpated. Bowel sounds normal.  Musculoskeletal: no clubbing / cyanosis. No joint deformity upper and lower extremities.  Skin: no significant rashes, lesions, ulcers. Warm, dry, well-perfused. Neurologic: CN 2-12 grossly intact. Sensation intact. Strength 5/5 in all 4 limbs.  Psychiatric: Alert and oriented x  3. Calm, cooperative.     Labs on Admission: I have personally reviewed following labs and imaging studies  CBC: Recent Labs  Lab 07/16/17 0020  WBC 16.9*  NEUTROABS 12.8*  HGB 10.1*  HCT 30.1*  MCV 93.8  PLT 448*   Basic Metabolic Panel: Recent Labs  Lab 07/16/17 0020  NA 130*  K 4.0  CL 97*  CO2 21*  GLUCOSE 833*  BUN 39*  CREATININE 1.68*  CALCIUM 9.2   GFR: CrCl cannot be calculated (Unknown ideal weight.). Liver Function Tests: Recent Labs  Lab 07/16/17 0020  AST 38  ALT 45  ALKPHOS 69  BILITOT 0.8  PROT 6.8  ALBUMIN 3.3*   No results for input(s): LIPASE, AMYLASE in the last 168 hours. No results for input(s): AMMONIA in the last 168 hours. Coagulation Profile: No results for input(s): INR, PROTIME in the last 168 hours. Cardiac Enzymes: No results for input(s): CKTOTAL, CKMB, CKMBINDEX, TROPONINI in the last 168 hours. BNP (last 3 results) No results for input(s): PROBNP in the last 8760 hours. HbA1C: No results for  input(s): HGBA1C in the last 72 hours. CBG: No results for input(s): GLUCAP in the last 168 hours. Lipid Profile: No results for input(s): CHOL, HDL, LDLCALC, TRIG, CHOLHDL, LDLDIRECT in the last 72 hours. Thyroid Function Tests: No results for input(s): TSH, T4TOTAL, FREET4, T3FREE, THYROIDAB in the last 72 hours. Anemia Panel: No results for input(s): VITAMINB12, FOLATE, FERRITIN, TIBC, IRON, RETICCTPCT in the last 72 hours. Urine analysis:    Component Value Date/Time   COLORURINE YELLOW (A) 12/19/2016 1722   APPEARANCEUR HAZY (A) 12/19/2016 1722   LABSPEC 1.019 12/19/2016 1722   PHURINE 6.0 12/19/2016 1722   GLUCOSEU NEGATIVE 12/19/2016 1722   HGBUR NEGATIVE 12/19/2016 1722   BILIRUBINUR NEGATIVE 12/19/2016 1722   KETONESUR NEGATIVE 12/19/2016 1722   PROTEINUR 30 (A) 12/19/2016 1722   NITRITE NEGATIVE 12/19/2016 1722   LEUKOCYTESUR NEGATIVE 12/19/2016 1722   Sepsis Labs: @LABRCNTIP (procalcitonin:4,lacticidven:4) )No  results found for this or any previous visit (from the past 240 hour(s)).   Radiological Exams on Admission: Dg Chest 2 View  Result Date: 07/15/2017 CLINICAL DATA:  Shortness of breath and cough EXAM: CHEST  2 VIEW COMPARISON:  CTA chest 12/19/2016 FINDINGS: Diffuse interstitial prominence. No pleural effusion or pneumothorax. Bibasilar atelectasis. Normal cardiomediastinal contours. The tip of the left PICC line is at the cavoatrial junction. IMPRESSION: Chronic findings of COPD without focal airspace consolidation. Electronically Signed   By: Deatra Robinson M.D.   On: 07/15/2017 23:48    EKG: Independently reviewed. Sinus tachycardia (rate 124), QTc 507 ms.   Assessment/Plan  1. Acute on chronic CHF; acute on chronic hypoxic respiratory failure  - Pt presents with acute distress, acute on chronic hypoxic respiratory failure  - No focal consolidation on CXR, no significant peripheral edema, but JVP elevated and rales appreciated  - She was treated in ED with Lasix 80 mg IV and has begun diuresing  - Continue cardiac monitoring, SLIV, fluid-restrict diet, follow daily wts and I/O's, continue diuresis with Lasix 80 mg IV q12h, continue beta-blocker, monitor renal fxn and lytes   2. COPD  - No wheezing on admission; pt denies increased cough or sputum production   - Continue supplemental O2, nebs    3. Uncontrolled IDDM; HHS  - A1c was 10.7% earlier this month  - Managed at home with Toujeo 30 units qAM, Novolog 25 units TID  - Serum glucose is 833 on admission with normal AG and no urine ketones; no coma  - Continue insulin infusion with frequent CBG's and serial chem panels; hold IVF given acute on chronic respiratory failure suspected secondary to CHF    4. Polymyositis  - Under treatment with azathioprine and prednisone, will continue    5. 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 Hyponatremia - Serum sodium is 130 on admission in setting of marked hyperglycemia  - Anticipate resolution with glycemic-control   - Following serial chem panels   7. Normocytic anemia  - Hgb is 10.1 on admission, down from 13.5 earlier this month  - Denies melena or hematochezia; likely an element of hemodilution in setting of acute CHF  - Repeat CBC in am     DVT prophylaxis: sq heparin  Code Status: Full  Family Communication: Discussed with patient Disposition Plan: Admit to SDU Consults called: None Admission status:  Inpatient    Briscoe Deutscher, MD [facility] Pager [phone]  If 7PM-7AM, please contact night-coverage [email or web address] Password [email or web address]  07/16/2017, 2:27 AM

## 2017-07-16 NOTE — Progress Notes (Signed)
  PROGRESS NOTE    MADDEX FICHTNER  Elizabeth Montes DOB: 1956/04/04 DOA: 07/15/2017 PCP: Patient, No Pcp Per   Chief Complaint  Patient presents with  . Shortness of Breath    Brief Narrative:  HPI on 07/16/2017 by Dr. Marcial Pacas Opyd Elizabeth Montes is a 61 y.o. 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  Patient is visiting from [place] where she was admitted to the hospital 2 weeks ago for acute on chronic hypoxic respiratory failure suspected secondary to exacerbation and COPD.  She reports that she was well and was walking up some stairs when she became acutely dyspneic EMS was called, found the patient to be saturating 68%.  This improved to 96% with nonrebreather and she was treated with DuoNeb in route to the hospital.  Patient reports that she was doing well until her oxygen tank stopped working.  She requires 5 L/min of supplemental oxygen around-the-clock at home.  Denies any significant chest pain, increase in her chronic cough, or increased sputum production.  Denies any fevers or chills.  Denies lower extremity swelling or tenderness.  Assessment & Plan   Admitted earlier today by Dr. Marcial Pacas Opyd. See H&P for details.  Acute on chronic hypoxic respiratory failure -Likely secondary to heart failure exacerbation -Patient uses 3 L of home oxygen -Patient presented with acute distress, and found to have oxygen saturation of 68%. She was then placed on a nonrebreather given a DuoNeb treatment in route to the hospital, O2 saturations been improved. -Chest x-ray unremarkable for infection. COPD findings of COPD.   Acute Heart failure exacerbation -unknown if this systolic or diastolic -no previous echo -BNP 138 -patient admits to drinking more fluid lately as she has felt very thirsty -Echocardiogram pending    -Continue IV Lasix -Monitor intake and output, daily weights  Hyperglycemia, uncontrolled diabetes mellitus, type II -Patient presented with a serum glucose of 833 on admission. Urine negative for ketones, normal anion gap. -Patient placed on insulin drip -Continue to monitor CBGs -Hold Tuojeo, and novolog (home medications)  Leukocytosis -Patient afebrile, chest x-ray and UA unremarkable for infection -Suspect secondary to the above versus prednisone -Continue to monitor CBC  COPD -No wheezing on exam -Continue supplemental oxygen and nebulizer treatments  Polymyositis -Continue azathioprine, prednisone  Acute kidney injury superimposed on chronic kidney disease, stage III -Creatinine earlier this month 1.15, currently 1.68 admission -Currently down to 1.29 -Monitor BMP closely as patient will be diuresing  Hyponatremia -Likely pseudohyponatremia due to hyperglycemia -Continue to monitor BMP  Normocytic anemia -Hemoglobin 10.1, suspect possibly hemodilution on this setting of acute CHF -Continue monitor CBC -Patient denies melena or hematochezia  Essential hypertension -Continue amlodipine, Lasix, metoprolol  DVT Prophylaxis  heparin  Code Status: Full  Family Communication: None at bedside  Disposition Plan: Admitted. Pending improvement. Suspect home within 1-2 days  Consultants None  Procedures  Echocardiorgram  Antibiotics   Anti-infectives (From admission, onward)   None      LOS: 0 days   Time Spent in minutes   30 minutes  Nevaeh Casillas D.O. on 07/16/2017 at 1:37 PM  Between 7am to 7pm - Pager - [phone]  After 7pm go to [email or web address] - password [email or web address]  And look for the night coverage person covering for me after hours  [facility] Office  [phone]

## 2017-07-16 NOTE — ED Notes (Signed)
Admitting provider at bedside.

## 2017-07-16 NOTE — ED Notes (Signed)
Heart healthy carb modified lunch tray ordered at 1225

## 2017-07-17 DIAGNOSIS — R0602 Shortness of breath: Secondary | ICD-10-CM

## 2017-07-17 DIAGNOSIS — R7989 Other specified abnormal findings of blood chemistry: Secondary | ICD-10-CM

## 2017-07-17 DIAGNOSIS — N289 Disorder of kidney and ureter, unspecified: Secondary | ICD-10-CM

## 2017-07-17 DIAGNOSIS — R739 Hyperglycemia, unspecified: Secondary | ICD-10-CM

## 2017-07-17 DIAGNOSIS — R0902 Hypoxemia: Secondary | ICD-10-CM

## 2017-07-17 DIAGNOSIS — N189 Chronic kidney disease, unspecified: Secondary | ICD-10-CM

## 2017-07-17 LAB — CBC
HEMATOCRIT: 31.4 % — AB (ref 36.0–46.0)
HEMOGLOBIN: 10.3 g/dL — AB (ref 12.0–15.0)
MCH: 30.6 pg (ref 26.0–34.0)
MCHC: 32.8 g/dL (ref 30.0–36.0)
MCV: 93.2 fL (ref 78.0–100.0)
Platelets: 357 10*3/uL (ref 150–400)
RBC: 3.37 MIL/uL — AB (ref 3.87–5.11)
RDW: 21.3 % — ABNORMAL HIGH (ref 11.5–15.5)
WBC: 16.3 10*3/uL — AB (ref 4.0–10.5)

## 2017-07-17 LAB — BASIC METABOLIC PANEL
ANION GAP: 11 (ref 5–15)
ANION GAP: 12 (ref 5–15)
BUN: 31 mg/dL — ABNORMAL HIGH (ref 6–20)
BUN: 36 mg/dL — ABNORMAL HIGH (ref 6–20)
CALCIUM: 8.4 mg/dL — AB (ref 8.9–10.3)
CHLORIDE: 95 mmol/L — AB (ref 101–111)
CO2: 26 mmol/L (ref 22–32)
CO2: 23 mmol/L (ref 22–32)
CREATININE: 1.41 mg/dL — AB (ref 0.44–1.00)
Calcium: 8.2 mg/dL — ABNORMAL LOW (ref 8.9–10.3)
Chloride: 91 mmol/L — ABNORMAL LOW (ref 101–111)
Creatinine, Ser: 1.27 mg/dL — ABNORMAL HIGH (ref 0.44–1.00)
GFR calc non Af Amer: 45 mL/min — ABNORMAL LOW (ref >60)
GFR, EST AFRICAN AMERICAN: 52 mL/min — AB (ref >60)
GFR, EST AFRICAN AMERICAN: 46 mL/min — AB (ref >60)
GFR, EST NON AFRICAN AMERICAN: 40 mL/min — AB (ref >60)
GLUCOSE: 583 mg/dL — AB (ref 65–99)
Glucose, Bld: 272 mg/dL — ABNORMAL HIGH (ref 65–99)
Potassium: 3.4 mmol/L — ABNORMAL LOW (ref 3.5–5.1)
Potassium: 4.9 mmol/L (ref 3.5–5.1)
Sodium: 132 mmol/L — ABNORMAL LOW (ref 135–145)
Sodium: 126 mmol/L — ABNORMAL LOW (ref 135–145)

## 2017-07-17 LAB — GLUCOSE, CAPILLARY
GLUCOSE-CAPILLARY: 156 mg/dL — AB (ref 65–99)
GLUCOSE-CAPILLARY: 114 mg/dL — AB (ref 65–99)
GLUCOSE-CAPILLARY: 209 mg/dL — AB (ref 65–99)
GLUCOSE-CAPILLARY: 576 mg/dL — AB (ref 65–99)
GLUCOSE-CAPILLARY: 551 mg/dL — AB (ref 65–99)
Glucose-Capillary: 158 mg/dL — ABNORMAL HIGH (ref 65–99)
Glucose-Capillary: 275 mg/dL — ABNORMAL HIGH (ref 65–99)
Glucose-Capillary: 323 mg/dL — ABNORMAL HIGH (ref 65–99)
Glucose-Capillary: 573 mg/dL (ref 65–99)
Glucose-Capillary: 58 mg/dL — ABNORMAL LOW (ref 65–99)

## 2017-07-17 MED ORDER — INSULIN ASPART 100 UNIT/ML ~~LOC~~ SOLN
26.0000 [IU] | Freq: Once | SUBCUTANEOUS | Status: AC
  Administered 2017-07-17: 26 [IU] via SUBCUTANEOUS

## 2017-07-17 MED ORDER — FUROSEMIDE 80 MG PO TABS
80.0000 mg | ORAL_TABLET | Freq: Every day | ORAL | Status: DC
  Administered 2017-07-18: 80 mg via ORAL
  Filled 2017-07-17: qty 1

## 2017-07-17 MED ORDER — IPRATROPIUM-ALBUTEROL 0.5-2.5 (3) MG/3ML IN SOLN
3.0000 mL | Freq: Two times a day (BID) | RESPIRATORY_TRACT | Status: DC
  Administered 2017-07-17 – 2017-07-18 (×2): 3 mL via RESPIRATORY_TRACT
  Filled 2017-07-17 (×2): qty 3

## 2017-07-17 MED ORDER — INSULIN GLARGINE 100 UNIT/ML ~~LOC~~ SOLN
20.0000 [IU] | Freq: Every day | SUBCUTANEOUS | Status: DC
  Administered 2017-07-17 – 2017-07-18 (×2): 20 [IU] via SUBCUTANEOUS
  Filled 2017-07-17 (×3): qty 0.2

## 2017-07-17 MED ORDER — INSULIN ASPART 100 UNIT/ML ~~LOC~~ SOLN
0.0000 [IU] | SUBCUTANEOUS | Status: DC
  Administered 2017-07-17: 7 [IU] via SUBCUTANEOUS
  Administered 2017-07-17: 20 [IU] via SUBCUTANEOUS
  Administered 2017-07-17: 15 [IU] via SUBCUTANEOUS
  Administered 2017-07-18: 11 [IU] via SUBCUTANEOUS
  Administered 2017-07-18: 3 [IU] via SUBCUTANEOUS
  Administered 2017-07-18: 20 [IU] via SUBCUTANEOUS

## 2017-07-17 MED ORDER — DEXTROSE 50 % IV SOLN
INTRAVENOUS | Status: AC
  Administered 2017-07-17: 17 mL
  Filled 2017-07-17: qty 50

## 2017-07-17 NOTE — Plan of Care (Signed)
  Progressing Health Behavior/Discharge Planning: Ability to manage health-related needs will improve 07/17/2017 2357 - Progressing by Ruel Favors, RN Clinical Measurements: Ability to maintain clinical measurements within normal limits will improve 07/17/2017 2357 - Progressing by Ruel Favors, RN Will remain free from infection 07/17/2017 2357 - Progressing by Ruel Favors, RN Diagnostic test results will improve 07/17/2017 2357 - Progressing by Ruel Favors, RN Respiratory complications will improve 07/17/2017 2357 - Progressing by Ruel Favors, RN Cardiovascular complication will be avoided 07/17/2017 2357 - Progressing by Ruel Favors, RN Activity: Risk for activity intolerance will decrease 07/17/2017 2357 - Progressing by Ruel Favors, RN Nutrition: Adequate nutrition will be maintained 07/17/2017 2357 - Progressing by Ruel Favors, RN Elimination: Will not experience complications related to bowel motility 07/17/2017 2357 - Progressing by Ruel Favors, RN Will not experience complications related to urinary retention 07/17/2017 2357 - Progressing by Ruel Favors, RN Pain Managment: General experience of comfort will improve 07/17/2017 2357 - Progressing by Ruel Favors, RN Safety: Ability to remain free from injury will improve 07/17/2017 2357 - Progressing by Ruel Favors, RN Skin Integrity: Risk for impaired skin integrity will decrease 07/17/2017 2357 - Progressing by Ruel Favors, RN

## 2017-07-17 NOTE — Progress Notes (Signed)
PROGRESS NOTE    Elizabeth Montes  OPF:[id number] DOB: 1956-02-13 DOA: 07/15/2017 PCP: Patient, No Pcp Per   Chief Complaint  Patient presents with  . 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 Patient was recently admitted in [place] 2 weeks ago for COPD exacerbation. Currently admitted for respiratory failure as well as CHF exacerbation, also found to have hyperglycemia.  Assessment & Plan  Acute on chronic hypoxic respiratory failure -Likely secondary to heart failure exacerbation -Patient uses 3 L of home oxygen -Patient presented with acute distress, and found to have oxygen saturation of 68%. She was then placed on a nonrebreather given a DuoNeb treatment in route to the hospital, O2 saturations been improved. -Chest x-ray unremarkable for infection. COPD findings of COPD.   Possible Acute Heart failure exacerbation -unknown if this systolic or diastolic -no previous echo -BNP 138 -patient admits to drinking more fluid lately as she has felt very thirsty -Echocardiogram EF of 55-60%, left ventricular diastolic function parameters were normal. No regional wall motion abnormalities. -Will transition off of IV Lasix and placed on oral, will continue to monitor for additional 0.4 hours -Monitor intake and output, daily weights  Hyperglycemia, uncontrolled diabetes mellitus, type II -Patient presented with a serum glucose of 833 on admission. Urine negative for ketones, normal anion gap. -Patient placed on insulin drip, however now transition to resistance sliding scale. Patient did have 1 hypoglycemic event overnight. -Continue to monitor CBGs -Hold Tuojeo, and novolog (home medications)  Leukocytosis -Patient afebrile, chest x-ray and UA unremarkable for infection -Suspect secondary to the above versus  prednisone -Continue to monitor CBC  COPD -No wheezing on exam -Continue supplemental oxygen and nebulizer treatments  Polymyositis -Continue azathioprine, prednisone  Acute kidney injury superimposed on chronic kidney disease, stage III -Creatinine earlier this month 1.15, currently 1.68 admission -Currently down to 1.27 -Monitor BMP closely as patient will be diuresing  Hyponatremia -Likely pseudohyponatremia due to hyperglycemia -Continue to monitor BMP  Normocytic anemia -Hemoglobin 10.3, suspect possibly hemodilution on this setting of acute CHF -Continue monitor CBC -Patient denies melena or hematochezia  Essential hypertension -Continue amlodipine, Lasix, metoprolol  DVT Prophylaxis  heparin  Code Status: Full  Family Communication: None at bedside  Disposition Plan: Admitted. Pending improvement. Suspect home within 1-2 days  Consultants None  Procedures  Echocardiorgram  Antibiotics   Anti-infectives (From admission, onward)   None      Subjective:   Elizabeth Montes seen and examined today.  Patient feels her breathing has improved. Denies current chest pain, bone pain, nausea vomiting, diarrhea or constipation, dizziness or headache.   Objective:   Vitals:   07/17/17 0510 07/17/17 0700 07/17/17 0832 07/17/17 1300  BP:  (!) 112/58 131/68 126/66  Pulse: 64 72 99 98  Resp: 15 18  19   Temp: (!) 97.4 F (36.3 C) 98 F (36.7 C)  98 F (36.7 C)  TempSrc: Oral Oral  Oral  SpO2:  98%  98%  Weight:      Height:        Intake/Output Summary (Last 24 hours) at 07/17/2017 1320 Last data filed at 07/17/2017 1300 Gross per 24 hour  Intake 1231.49 ml  Output 950 ml  Net 281.49 ml   Filed Weights   07/16/17 0328 07/17/17 0424  Weight: 83 kg (183 lb) 84.1 kg (185 lb 6.4 oz)  Exam  General: Well developed, well nourished, NAD, appears stated age  HEENT: NCAT, mucous membranes moist.   Cardiovascular: S1 S2 auscultated, no rubs,  murmurs or gallops. Regular rate and rhythm.  Respiratory: Diminished breath sounds   Abdomen: Soft, obese, nontender, nondistended, + bowel sounds  Extremities: warm dry without cyanosis clubbing or edema  Neuro: AAOx3, nonfocal  Psych: Normal affect and demeanor with intact judgement and insight   Data Reviewed: I have personally reviewed following labs and imaging studies  CBC: Recent Labs  Lab 07/16/17 0020 07/17/17 0728  WBC 16.9* 16.3*  NEUTROABS 12.8*  --   HGB 10.1* 10.3*  HCT 30.1* 31.4*  MCV 93.8 93.2  PLT 448* 357   Basic Metabolic Panel: Recent Labs  Lab 07/16/17 0444 07/16/17 0848 07/16/17 1636 07/16/17 1842 07/17/17 1030  NA 132* 135 136 136 132*  K 3.8 3.6 4.8 4.1 3.4*  CL 96* 100* 103 101 95*  CO2 22 24 20* 23 26  GLUCOSE 683* 337* 252* 140* 272*  BUN 40* 35* 35* 34* 31*  CREATININE 1.52* 1.29* 1.33* 1.36* 1.27*  CALCIUM 9.1 9.1 9.4 9.4 8.2*  MG 2.0  --   --   --   --    GFR: Estimated Creatinine Clearance: 49 mL/min (A) (by C-G formula based on SCr of 1.27 mg/dL (H)). Liver Function Tests: Recent Labs  Lab 07/16/17 0020  AST 38  ALT 45  ALKPHOS 69  BILITOT 0.8  PROT 6.8  ALBUMIN 3.3*   No results for input(s): LIPASE, AMYLASE in the last 168 hours. No results for input(s): AMMONIA in the last 168 hours. Coagulation Profile: No results for input(s): INR, PROTIME in the last 168 hours. Cardiac Enzymes: No results for input(s): CKTOTAL, CKMB, CKMBINDEX, TROPONINI in the last 168 hours. BNP (last 3 results) No results for input(s): PROBNP in the last 8760 hours. HbA1C: No results for input(s): HGBA1C in the last 72 hours. CBG: Recent Labs  Lab 07/17/17 0203 07/17/17 0307 07/17/17 0328 07/17/17 0640 07/17/17 1146  GLUCAP 114* 58* 158* 209* 323*   Lipid Profile: No results for input(s): CHOL, HDL, LDLCALC, TRIG, CHOLHDL, LDLDIRECT in the last 72 hours. Thyroid Function Tests: No results for input(s): TSH, T4TOTAL, FREET4,  T3FREE, THYROIDAB in the last 72 hours. Anemia Panel: No results for input(s): VITAMINB12, FOLATE, FERRITIN, TIBC, IRON, RETICCTPCT in the last 72 hours. Urine analysis:    Component Value Date/Time   COLORURINE YELLOW 07/16/2017 0526   APPEARANCEUR CLEAR 07/16/2017 0526   LABSPEC 1.010 07/16/2017 0526   PHURINE 5.0 07/16/2017 0526   GLUCOSEU >=500 (A) 07/16/2017 0526   HGBUR NEGATIVE 07/16/2017 0526   BILIRUBINUR NEGATIVE 07/16/2017 0526   KETONESUR NEGATIVE 07/16/2017 0526   PROTEINUR NEGATIVE 07/16/2017 0526   NITRITE NEGATIVE 07/16/2017 0526   LEUKOCYTESUR NEGATIVE 07/16/2017 0526   Sepsis Labs: @LABRCNTIP (procalcitonin:4,lacticidven:4)  ) Recent Results (from the past 240 hour(s))  MRSA PCR Screening     Status: Abnormal   Collection Time: 07/16/17  5:03 PM  Result Value Ref Range Status   MRSA by PCR POSITIVE (A) NEGATIVE Final    Comment:        The GeneXpert MRSA Assay (FDA approved for NASAL specimens only), is one component of a comprehensive MRSA colonization surveillance program. It is not intended to diagnose MRSA infection nor to guide or monitor treatment for MRSA infections. RESULT CALLED TO, READ BACK BY AND VERIFIED WITH: Maryln Manuel RN, AT [phone] 07/16/17 BY D. Leighton Roach  Radiology Studies: Dg Chest 2 View  Result Date: 07/15/2017 CLINICAL DATA:  Shortness of breath and cough EXAM: CHEST  2 VIEW COMPARISON:  CTA chest 12/19/2016 FINDINGS: Diffuse interstitial prominence. No pleural effusion or pneumothorax. Bibasilar atelectasis. Normal cardiomediastinal contours. The tip of the left PICC line is at the cavoatrial junction. IMPRESSION: Chronic findings of COPD without focal airspace consolidation. Electronically Signed   By: Deatra RobinsonKevin  Herman M.D.   On: 07/15/2017 23:48     Scheduled Meds: . amLODipine  10 mg Oral Daily  . aspirin EC  81 mg Oral Daily  . atorvastatin  80 mg Oral Daily  . azaTHIOprine  25 mg Oral Daily  . Chlorhexidine Gluconate  Cloth  6 each Topical Q0600  . fenofibrate  160 mg Oral Daily  . furosemide  80 mg Intravenous BID  . insulin aspart  0-20 Units Subcutaneous Q4H  . ipratropium-albuterol  3 mL Nebulization BID  . metoCLOPramide  5 mg Oral TID AC  . metoprolol tartrate  25 mg Oral BID  . mupirocin ointment  1 application Nasal BID  . predniSONE  40 mg Oral Q breakfast  . sodium chloride flush  3 mL Intravenous Q12H  . tamsulosin  0.4 mg Oral QPC supper   Continuous Infusions: . sodium chloride    . sodium chloride 10 mL/hr at 07/16/17 0441     LOS: 1 day   Time Spent in minutes   30 minutes  Charlotte Brafford D.O. on 07/17/2017 at 1:20 PM  Between 7am to 7pm - Pager - [phone]  After 7pm go to [email or web address] - password [email or web address]  And look for the night coverage person covering for me after hours  [facility] Office  [phone]

## 2017-07-18 DIAGNOSIS — I5033 Acute on chronic diastolic (congestive) heart failure: Secondary | ICD-10-CM

## 2017-07-18 LAB — GLUCOSE, CAPILLARY
GLUCOSE-CAPILLARY: 83 mg/dL (ref 65–99)
GLUCOSE-CAPILLARY: 364 mg/dL — AB (ref 65–99)
Glucose-Capillary: 193 mg/dL — ABNORMAL HIGH (ref 65–99)

## 2017-07-18 MED ORDER — PREDNISONE 20 MG PO TABS
30.0000 mg | ORAL_TABLET | Freq: Every day | ORAL | Status: DC
  Administered 2017-07-18: 09:00:00 30 mg via ORAL
  Filled 2017-07-18: qty 1

## 2017-07-18 NOTE — Progress Notes (Signed)
Pt ambulated in hallway with O2@3L /min [place] and O2 sats monitored.  Oxygen saturations decreased to 86-88% on O2@ 3L/min .  Pt was also winded with ambulation.  After sitting on side of bed for a few minutes.  Pt recovered oxygen sats to above 94%.

## 2017-07-18 NOTE — Progress Notes (Signed)
Discharged via WC to POV for home with family by NT.  No s/s of distress at this time.  Respirations even and unlabored.  Family accompanied patient.  Home O2 on pt.

## 2017-07-18 NOTE — Progress Notes (Signed)
Discharge instructions given and understanding verbalized.  Denies questions.  Respirations even and unlabored.  Pt has portable O2@bedside .

## 2017-07-18 NOTE — Discharge Instructions (Signed)
Blood Glucose Monitoring, Adult °Monitoring your blood sugar (glucose) helps you manage your diabetes. It also helps you and your health care provider determine how well your diabetes management plan is working. Blood glucose monitoring involves checking your blood glucose as often as directed, and keeping a record (log) of your results over time. °Why should I monitor my blood glucose? °Checking your blood glucose regularly can: °· Help you understand how food, exercise, illnesses, and medicines affect your blood glucose. °· Let you know what your blood glucose is at any time. You can quickly tell if you are having low blood glucose (hypoglycemia) or high blood glucose (hyperglycemia). °· Help you and your health care provider adjust your medicines as needed. ° °When should I check my blood glucose? °Follow instructions from your health care provider about how often to check your blood glucose. This may depend on: °· The type of diabetes you have. °· How well-controlled your diabetes is. °· Medicines you are taking. ° °If you have type 1 diabetes: °· Check your blood glucose at least 2 times a day. °· Also check your blood glucose: °? Before every insulin injection. °? Before and after exercise. °? Between meals. °? 2 hours after a meal. °? Occasionally between 2:00 a.m. and 3:00 a.m., as directed. °? Before potentially dangerous tasks, like driving or using heavy machinery. °? At bedtime. °· You may need to check your blood glucose more often, up to 6-10 times a day: °? If you use an insulin pump. °? If you need multiple daily injections (MDI). °? If your diabetes is not well-controlled. °? If you are ill. °? If you have a history of severe hypoglycemia. °? If you have a history of not knowing when your blood glucose is getting low (hypoglycemia unawareness). °If you have type 2 diabetes: °· If you take insulin or other diabetes medicines, check your blood glucose at least 2 times a day. °· If you are on intensive  insulin therapy, check your blood glucose at least 4 times a day. Occasionally, you may also need to check between 2:00 a.m. and 3:00 a.m., as directed. °· Also check your blood glucose: °? Before and after exercise. °? Before potentially dangerous tasks, like driving or using heavy machinery. °· You may need to check your blood glucose more often if: °? Your medicine is being adjusted. °? Your diabetes is not well-controlled. °? You are ill. °What is a blood glucose log? °· A blood glucose log is a record of your blood glucose readings. It helps you and your health care provider: °? Look for patterns in your blood glucose over time. °? Adjust your diabetes management plan as needed. °· Every time you check your blood glucose, write down your result and notes about things that may be affecting your blood glucose, such as your diet and exercise for the day. °· Most glucose meters store a record of glucose readings in the meter. Some meters allow you to download your records to a computer. °How do I check my blood glucose? °Follow these steps to get accurate readings of your blood glucose: °Supplies needed ° °· Blood glucose meter. °· Test strips for your meter. Each meter has its own strips. You must use the strips that come with your meter. °· A needle to prick your finger (lancet). Do not use lancets more than once. °· A device that holds the lancet (lancing device). °· A journal or log book to write down your results. °Procedure °·   Wash your hands with soap and water.  Prick the side of your finger (not the tip) with the lancet. Use a different finger each time.  Gently rub the finger until a small drop of blood appears.  Follow instructions that come with your meter for inserting the test strip, applying blood to the strip, and using your blood glucose meter.  Write down your result and any notes. Alternative testing sites  Some meters allow you to use areas of your body other than your finger  (alternative sites) to test your blood.  If you think you may have hypoglycemia, or if you have hypoglycemia unawareness, do not use alternative sites. Use your finger instead.  Alternative sites may not be as accurate as the fingers, because blood flow is slower in these areas. This means that the result you get may be delayed, and it may be different from the result that you would get from your finger.  The most common alternative sites are: ? Forearm. ? Thigh. ? Palm of the hand. Additional tips  Always keep your supplies with you.  If you have questions or need help, all blood glucose meters have a 24-hour hotline number that you can call. You may also contact your health care provider.  After you use a few boxes of test strips, adjust (calibrate) your blood glucose meter by following instructions that came with your meter. This information is not intended to replace advice given to you by your health care provider. Make sure you discuss any questions you have with your health care provider. Document Released: 07/16/2003 Document Revised: 01/31/2016 Document Reviewed: 12/23/2015 Elsevier Interactive Patient Education  2017 [employer]   Hyperglycemia Hyperglycemia is when the sugar (glucose) level in your blood is too high. It may not cause symptoms. If you do have symptoms, they may include warning signs, such as:  Feeling more thirsty than normal.  Hunger.  Feeling tired.  Needing to pee (urinate) more than normal.  Blurry eyesight (vision).  You may get other symptoms as it gets worse, such as:  Dry mouth.  Not being hungry (loss of appetite).  Fruity-smelling breath.  Weakness.  Weight gain or loss that is not planned. Weight loss may be fast.  A tingling or numb feeling in your hands or feet.  Headache.  Skin that does not bounce back quickly when it is lightly pinched and released (poor skin turgor).  Pain in your belly (abdomen).  Cuts or bruises  that heal slowly.  High blood sugar can happen to people who do or do not have diabetes. High blood sugar can happen slowly or quickly, and it can be an emergency. Follow these instructions at home: General instructions  Take over-the-counter and prescription medicines only as told by your doctor.  Do not use products that contain nicotine or tobacco, such as cigarettes and e-cigarettes. If you need help quitting, ask your doctor.  Limit alcohol intake to no more than 1 drink per day for nonpregnant women and 2 drinks per day for men. One drink equals 12 oz of beer, 5 oz of wine, or 1 oz of hard liquor.  Manage stress. If you need help with this, ask your doctor.  Keep all follow-up visits as told by your doctor. This is important. Eating and drinking  Stay at a healthy weight.  Exercise regularly, as told by your doctor.  Drink enough fluid, especially when you: ? Exercise. ? Get sick. ? Are in hot temperatures.  Eat healthy  foods, such as: ? Low-fat (lean) proteins. ? Complex carbs (complex carbohydrates), such as whole wheat bread or brown rice. ? Fresh fruits and vegetables. ? Low-fat dairy products. ? Healthy fats.  Drink enough fluid to keep your pee (urine) clear or pale yellow. If you have diabetes:  Make sure you know the symptoms of hyperglycemia.  Follow your diabetes management plan, as told by your doctor. Make sure you: ? Take insulin and medicines as told. ? Follow your exercise plan. ? Follow your meal plan. Eat on time. Do not skip meals. ? Check your blood sugar as often as told. Make sure to check before and after exercise. If you exercise longer or in a different way than you normally do, check your blood sugar more often. ? Follow your sick day plan whenever you cannot eat or drink normally. Make this plan ahead of time with your doctor.  Share your diabetes management plan with people in your workplace, school, and household.  Check your urine for  ketones when you are ill and as told by your doctor.  Carry a card or wear jewelry that says that you have diabetes. Contact a doctor if:  Your blood sugar level is higher than 240 mg/dL (69.7 mmol/L) for 2 days in a row.  You have problems keeping your blood sugar in your target range.  High blood sugar happens often for you. Get help right away if:  You have trouble breathing.  You have a change in how you think, feel, or act (mental status).  You feel sick to your stomach (nauseous), and that feeling does not go away.  You cannot stop throwing up (vomiting). These symptoms may be an emergency. Do not wait to see if the symptoms will go away. Get medical help right away. Call your local emergency services (911 in the [place] Do not drive yourself to the hospital. Summary  Hyperglycemia is when the sugar (glucose) level in your blood is too high.  High blood sugar can happen to people who do or do not have diabetes.  Make sure you drink enough fluids, eat healthy foods, and exercise regularly.  Contact your doctor if you have problems keeping your blood sugar in your target range. This information is not intended to replace advice given to you by your health care provider. Make sure you discuss any questions you have with your health care provider. Document Released: 05/10/2009 Document Revised: 03/30/2016 Document Reviewed: 03/30/2016 Elsevier Interactive Patient Education  2017 [employer].

## 2017-07-18 NOTE — Care Management Note (Signed)
Case Management Note  Patient Details  Name: Elizabeth Montes MRN: [id number] Date of Birth: 06-Aug-1955  Subjective/Objective:          Pt from home in [place] with family.  Pt here visiting family and plans to return to [place] in 1 week.  Pt has DME O2 in [place] that she has brought with here to [facility].  Pt states she has scales and follows diet.  She states she has been thristy lately and has exceeded fluid restrictions, but she will try to do better now that her blood sugar is under control.  Pt states she has PMD and no barriers to care or getting medications.  She states she has all equipment she needs.           Action/Plan: No CM needs at this time.  Pt plans to return to [place].  Expected Discharge Date:  07/18/17               Expected Discharge Plan:  Home/Self Care  In-House Referral:  NA  Discharge planning Services  CM Consult  Post Acute Care Choice:  NA Choice offered to:     DME Arranged:    DME Agency:     HH Arranged:    HH Agency:     Status of Service:  Completed, signed off  If discussed at [employer] of Stay Meetings, dates discussed:    Additional Comments:  Verdene Lennert, RN 07/18/2017, 1:59 PM

## 2017-07-20 NOTE — Discharge Summary (Signed)
[facility] Discharge Summary   Patient: Elizabeth Montes:[id number]   PCP: Patient, No Pcp Per DOB: 1956-01-22   Date of admission: 07/15/2017   Date of discharge: 07/18/2017    Discharge Diagnoses:  Principal Problem:   CHF, acute on chronic [facility]) Active Problems:   Uncontrolled type 2 diabetes mellitus with hyperosmolar nonketotic hyperglycemia (HCC)   Acute on chronic respiratory failure with hypoxia (HCC)   COPD (chronic obstructive pulmonary disease) (HCC)   Hypertension   Polymyositis (HCC)   Acute renal failure superimposed on stage 2 chronic kidney disease (HCC)   Admitted From: home Disposition:  home  Recommendations for Outpatient Follow-up:  1. Please follow up with PCP in 1 week   Follow-up Information    PCP. Schedule an appointment as soon as possible for a visit in 1 week(s).          Diet recommendation: CARDIAC DIET  Activity: The patient is advised to gradually reintroduce usual activities.  Discharge Condition: good  Code Status: full code  History of present illness: As per the H and P dictated on admission, "Elizabeth Montes is a 61 y.o. 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  Patient is visiting from [place] where she was admitted to the hospital 2 weeks ago for acute on chronic hypoxic respiratory failure suspected secondary to exacerbation and COPD.  She reports that she was well and was walking up some stairs when she became acutely dyspneic EMS was called, found the patient to be saturating 68%.  This improved to 96% with nonrebreather and she was treated with DuoNeb in route to the hospital.  Patient reports that she was doing well until her oxygen tank stopped working.  She requires 5 L/min of supplemental oxygen around-the-clock at home.  Denies any significant  chest pain, increase in her chronic cough, or increased sputum production.  Denies any fevers or chills.  Denies lower extremity swelling or tenderness.  ED Course: Upon arrival to the ED, patient is found to be saturating 90% on nonrebreather, tachypneic with 27 breaths/min, tachycardic in the 120s, and with stable blood pressure.  EKG features a sinus tachycardia with rate 124 and QTc interval of 507 ms.  Chest x-ray is notable for chronic changes of COPD without focal consolidation.  Chemistry panel reveals a glucose of 833, sodium 130, bicarbonate 21, and creatinine 1.68, up from 1.15 two weeks ago.  CBC is notable for leukocytosis to 16,900 and a normocytic anemia with hemoglobin of 10.1, down from 13.5 earlier this month.  Troponin is within the normal limits and BNP is slightly elevated 238.  Patient was started on insulin infusion and treated with 80 mg IV Lasix in the she remains slightly tachypneic and tachycardic, but seems to be improving and blood pressure remained stable.  She will be admitted to the stepdown unit for ongoing evaluation and management of acute on chronic hypoxic respiratory failure, suspected secondary to acute on chronic CHF, as well as insulin dependent diabetes with [facility]."  Hospital Course:  Summary of her active problems in the hospital is as following. Acute on chronic hypoxic respiratory failure -Likely secondary to heart failure exacerbation -Patient uses 3 L of home oxygen -Patient presented with acute distress, and found to have oxygen saturation of 68%. She was then placed on a nonrebreather given a DuoNeb treatment in route to the  hospital, O2 saturations been improved. -Chest x-ray unremarkable for infection. COPD findings of COPD.   Acute Heart failure exacerbation -unknown if this systolic or diastolic -no previous echo -BNP 138 -patient admits to drinking more fluid lately as she has felt very thirsty -Echocardiogram EF of 55-60%, left ventricular  diastolic function parameters were normal. No regional wall motion abnormalities. -resume home meds.   Hyperglycemia, uncontrolled diabetesmellitus, type II -Patient presented with a serum glucose of 833 on admission.Urine negative for ketones, normal anion gap. -Patient placed on insulin drip,  - hyperglycemia is likely due to less dose of insulin, resuming home dose on discharge.   Leukocytosis -Patient afebrile, chest x-ray and UA unremarkable for infection -Suspect secondary to the above versus prednisone -Continue to monitor CBC  COPD -No wheezing on exam -Continue supplemental oxygen and nebulizer treatments  Polymyositis -Continue azathioprine,prednisone  Acute kidney injury superimposed on chronic kidney disease, stageIII -Creatinine earlier this month 1.15, currently 1.68 admission -Currently down to 1.27  Hyponatremia -pseudohyponatremia due to hyperglycemia  Normocytic anemia -Hemoglobin 10.3, suspect possibly hemodilution on this setting of acute CHF -Continue monitor CBC -Patient denies melena or hematochezia  Essential hypertension -Continue amlodipine, Lasix, metoprolol  All other chronic medical condition were stable during the hospitalization.  Patient was ambulatory without any assistance. On the day of the discharge the patient's vitals were stable, and no other acute medical condition were reported by patient. the patient was felt safe to be discharge at home with family.  Procedures and Results:  Echocardiogram    Consultations:  none  DISCHARGE MEDICATION: Allergies as of 07/18/2017      Reactions   Lovenox [enoxaparin Sodium] Other (See Comments)   "made me bleed"      Medication List    TAKE these medications   amLODipine 10 MG tablet Commonly known as:  NORVASC Take 10 mg by mouth daily.   aspirin EC 81 MG tablet Take 81 mg by mouth daily.   atorvastatin 80 MG tablet Commonly known as:  LIPITOR Take 80 mg by mouth  daily.   azaTHIOprine 50 MG tablet Commonly known as:  IMURAN Take 25 mg by mouth daily.   fenofibrate 160 MG tablet Take 160 mg by mouth daily.   furosemide 80 MG tablet Commonly known as:  LASIX Take 80 mg by mouth daily.   metoCLOPramide 10 MG tablet Commonly known as:  REGLAN Take 5 mg by mouth 3 (three) times daily before meals.   metoprolol tartrate 25 MG tablet Commonly known as:  LOPRESSOR Take 25 mg by mouth 2 (two) times daily.   NOVOLOG FLEXPEN 100 UNIT/ML FlexPen Generic drug:  insulin aspart Inject 25 Units into the skin 3 (three) times daily with meals.   oxyCODONE-acetaminophen 5-325 MG tablet Commonly known as:  PERCOCET/ROXICET Take 1 tablet by mouth every 4 (four) hours as needed for severe pain.   predniSONE 50 MG tablet Commonly known as:  DELTASONE Take 1 tablet (50 mg total) by mouth daily with breakfast. What changed:  how much to take   SPIRIVA HANDIHALER 18 MCG inhalation capsule Generic drug:  tiotropium Place 18 mcg into inhaler and inhale daily.   tamsulosin 0.4 MG Caps capsule Commonly known as:  FLOMAX Take 0.4 mg by mouth daily after supper.   TOUJEO SOLOSTAR 300 UNIT/ML Sopn Generic drug:  Insulin Glargine Inject 30 Units into the skin every morning.      Allergies  Allergen Reactions  . Lovenox [Enoxaparin Sodium] Other (See Comments)    "  made me bleed"   Discharge Instructions    Diet - low sodium heart healthy   Complete by:  As directed    Discharge instructions   Complete by:  As directed    It is important that you read following instructions as well as go over your medication list with RN to help you understand your care after this hospitalization.  Discharge Instructions: Please follow-up with PCP in one week  Please request your primary care physician to go over all Hospital Tests and Procedure/Radiological results at the follow up,  Please get all Hospital records sent to your PCP by signing hospital release  before you go home.   Do not take more than prescribed Pain, Sleep and Anxiety Medications. You were cared for by a hospitalist during your hospital stay. If you have any questions about your discharge medications or the care you received while you were in the hospital after you are discharged, you can call the unit and ask to speak with the hospitalist on call if the hospitalist that took care of you is not available.  Once you are discharged, your primary care physician will handle any further medical issues. Please note that NO REFILLS for any discharge medications will be authorized once you are discharged, as it is imperative that you return to your primary care physician (or establish a relationship with a primary care physician if you do not have one) for your aftercare needs so that they can reassess your need for medications and monitor your lab values. You Must read complete instructions/literature along with all the possible adverse reactions/side effects for all the Medicines you take and that have been prescribed to you. Take any new Medicines after you have completely understood and accept all the possible adverse reactions/side effects. Wear Seat belts while driving. If you have smoked or chewed Tobacco in the last 2 yrs please stop smoking and/or stop any Recreational drug use.   Increase activity slowly   Complete by:  As directed      Discharge Exam: Filed Weights   07/16/17 0328 07/17/17 0424 07/18/17 0417  Weight: 83 kg (183 lb) 84.1 kg (185 lb 6.4 oz) 84.4 kg (186 lb 1.6 oz)   Vitals:   07/18/17 0854 07/18/17 1351  BP: 129/82 (!) 121/59  Pulse: 97 92  Resp: 18 19  Temp: 98.3 F (36.8 C) 98.4 F (36.9 C)  SpO2: 98%    General: Appear in no distress, no Rash; Oral Mucosa moist. Cardiovascular: S1 and S2 Present, no Murmur, no JVD Respiratory: Bilateral Air entry present and Clear to Auscultation, no Crackles, no wheezes Abdomen: Bowel Sound present, Soft and no  tenderness Extremities: no Pedal edema, no calf tenderness Neurology: Grossly no focal neuro deficit.  The results of significant diagnostics from this hospitalization (including imaging, microbiology, ancillary and laboratory) are listed below for reference.    Significant Diagnostic Studies: Dg Chest 2 View  Result Date: 07/15/2017 CLINICAL DATA:  Shortness of breath and cough EXAM: CHEST  2 VIEW COMPARISON:  CTA chest 12/19/2016 FINDINGS: Diffuse interstitial prominence. No pleural effusion or pneumothorax. Bibasilar atelectasis. Normal cardiomediastinal contours. The tip of the left PICC line is at the cavoatrial junction. IMPRESSION: Chronic findings of COPD without focal airspace consolidation. Electronically Signed   By: Deatra Robinson M.D.   On: 07/15/2017 23:48    Microbiology: Recent Results (from the past 240 hour(s))  MRSA PCR Screening     Status: Abnormal   Collection Time: 07/16/17  5:03  PM  Result Value Ref Range Status   MRSA by PCR POSITIVE (A) NEGATIVE Final    Comment:        The GeneXpert MRSA Assay (FDA approved for NASAL specimens only), is one component of a comprehensive MRSA colonization surveillance program. It is not intended to diagnose MRSA infection nor to guide or monitor treatment for MRSA infections. RESULT CALLED TO, READ BACK BY AND VERIFIED WITH: Maryln Manuel. JOHNSON RN, AT [phone] 07/16/17 BY D. VANHOOK      Labs: CBC: Recent Labs  Lab 07/16/17 0020 07/17/17 0728  WBC 16.9* 16.3*  NEUTROABS 12.8*  --   HGB 10.1* 10.3*  HCT 30.1* 31.4*  MCV 93.8 93.2  PLT 448* 357   Basic Metabolic Panel: Recent Labs  Lab 07/16/17 0444 07/16/17 0848 07/16/17 1636 07/16/17 1842 07/17/17 1030 07/17/17 1700  NA 132* 135 136 136 132* 126*  K 3.8 3.6 4.8 4.1 3.4* 4.9  CL 96* 100* 103 101 95* 91*  CO2 22 24 20* 23 26 23   GLUCOSE 683* 337* 252* 140* 272* 583*  BUN 40* 35* 35* 34* 31* 36*  CREATININE 1.52* 1.29* 1.33* 1.36* 1.27* 1.41*  CALCIUM 9.1 9.1 9.4  9.4 8.2* 8.4*  MG 2.0  --   --   --   --   --    Liver Function Tests: Recent Labs  Lab 07/16/17 0020  AST 38  ALT 45  ALKPHOS 69  BILITOT 0.8  PROT 6.8  ALBUMIN 3.3*   No results for input(s): LIPASE, AMYLASE in the last 168 hours. No results for input(s): AMMONIA in the last 168 hours. Cardiac Enzymes: No results for input(s): CKTOTAL, CKMB, CKMBINDEX, TROPONINI in the last 168 hours. BNP (last 3 results) Recent Labs    12/19/16 1421 07/16/17 0020  BNP 65.0 138.1*   CBG: Recent Labs  Lab 07/17/17 2025 07/18/17 0000 07/18/17 0415 07/18/17 0731 07/18/17 1128  GLUCAP 551* 275* 83 193* 364*   Time spent: 35 minutes  Signed:  Lynden OxfordPranav Patel  [facility] 07/18/2017, 3:03 PM

## 2017-08-01 ENCOUNTER — Emergency Department (HOSPITAL_COMMUNITY): Payer: Medicare HMO

## 2017-08-01 ENCOUNTER — Encounter (HOSPITAL_COMMUNITY): Payer: Self-pay | Admitting: Emergency Medicine

## 2017-08-01 ENCOUNTER — Inpatient Hospital Stay (HOSPITAL_COMMUNITY)
Admission: EM | Admit: 2017-08-01 | Discharge: 2017-08-03 | DRG: 177 | Disposition: A | Discharge status: Home / Self Care | Payer: Medicare HMO | Attending: Family Medicine | Admitting: Family Medicine

## 2017-08-01 ENCOUNTER — Other Ambulatory Visit: Payer: Self-pay

## 2017-08-01 DIAGNOSIS — Z79899 Other long term (current) drug therapy: Secondary | ICD-10-CM | POA: Diagnosis not present

## 2017-08-01 DIAGNOSIS — E1122 Type 2 diabetes mellitus with diabetic chronic kidney disease: Secondary | ICD-10-CM | POA: Diagnosis present

## 2017-08-01 DIAGNOSIS — I130 Hypertensive heart and chronic kidney disease with heart failure and stage 1 through stage 4 chronic kidney disease, or unspecified chronic kidney disease: Secondary | ICD-10-CM | POA: Diagnosis present

## 2017-08-01 DIAGNOSIS — E669 Obesity, unspecified: Secondary | ICD-10-CM | POA: Diagnosis present

## 2017-08-01 DIAGNOSIS — J9601 Acute respiratory failure with hypoxia: Secondary | ICD-10-CM | POA: Diagnosis present

## 2017-08-01 DIAGNOSIS — Z79891 Long term (current) use of opiate analgesic: Secondary | ICD-10-CM

## 2017-08-01 DIAGNOSIS — I5033 Acute on chronic diastolic (congestive) heart failure: Secondary | ICD-10-CM

## 2017-08-01 DIAGNOSIS — N182 Chronic kidney disease, stage 2 (mild): Secondary | ICD-10-CM | POA: Diagnosis present

## 2017-08-01 DIAGNOSIS — I509 Heart failure, unspecified: Secondary | ICD-10-CM | POA: Diagnosis present

## 2017-08-01 DIAGNOSIS — J449 Chronic obstructive pulmonary disease, unspecified: Secondary | ICD-10-CM | POA: Diagnosis present

## 2017-08-01 DIAGNOSIS — M3320 Polymyositis, organ involvement unspecified: Secondary | ICD-10-CM | POA: Diagnosis present

## 2017-08-01 DIAGNOSIS — F17210 Nicotine dependence, cigarettes, uncomplicated: Secondary | ICD-10-CM | POA: Diagnosis present

## 2017-08-01 DIAGNOSIS — I252 Old myocardial infarction: Secondary | ICD-10-CM | POA: Diagnosis not present

## 2017-08-01 DIAGNOSIS — Z7952 Long term (current) use of systemic steroids: Secondary | ICD-10-CM | POA: Diagnosis not present

## 2017-08-01 DIAGNOSIS — J440 Chronic obstructive pulmonary disease with acute lower respiratory infection: Secondary | ICD-10-CM | POA: Diagnosis not present

## 2017-08-01 DIAGNOSIS — Z9981 Dependence on supplemental oxygen: Secondary | ICD-10-CM

## 2017-08-01 DIAGNOSIS — J8410 Pulmonary fibrosis, unspecified: Secondary | ICD-10-CM | POA: Diagnosis present

## 2017-08-01 DIAGNOSIS — E876 Hypokalemia: Secondary | ICD-10-CM | POA: Diagnosis present

## 2017-08-01 DIAGNOSIS — Y95 Nosocomial condition: Secondary | ICD-10-CM | POA: Diagnosis present

## 2017-08-01 DIAGNOSIS — J189 Pneumonia, unspecified organism: Secondary | ICD-10-CM | POA: Diagnosis present

## 2017-08-01 DIAGNOSIS — Z6834 Body mass index (BMI) 34.0-34.9, adult: Secondary | ICD-10-CM | POA: Diagnosis not present

## 2017-08-01 DIAGNOSIS — Z7982 Long term (current) use of aspirin: Secondary | ICD-10-CM

## 2017-08-01 DIAGNOSIS — Z794 Long term (current) use of insulin: Secondary | ICD-10-CM | POA: Diagnosis not present

## 2017-08-01 DIAGNOSIS — J690 Pneumonitis due to inhalation of food and vomit: Principal | ICD-10-CM | POA: Diagnosis present

## 2017-08-01 DIAGNOSIS — E119 Type 2 diabetes mellitus without complications: Secondary | ICD-10-CM

## 2017-08-01 DIAGNOSIS — Z888 Allergy status to other drugs, medicaments and biological substances status: Secondary | ICD-10-CM

## 2017-08-01 LAB — I-STAT VENOUS BLOOD GAS, ED
Acid-Base Excess: 1 mmol/L (ref 0.0–2.0)
Bicarbonate: 25.7 mmol/L (ref 20.0–28.0)
O2 SAT: 40 %
PH VEN: 7.434 — AB (ref 7.250–7.430)
TCO2: 27 mmol/L (ref 22–32)
pCO2, Ven: 38.2 mmHg — ABNORMAL LOW (ref 44.0–60.0)
pO2, Ven: 22 mmHg — CL (ref 32.0–45.0)

## 2017-08-01 LAB — CBC WITH DIFFERENTIAL/PLATELET
BASOS ABS: 0 10*3/uL (ref 0.0–0.1)
Basophils Relative: 0 %
EOS ABS: 0 10*3/uL (ref 0.0–0.7)
Eosinophils Relative: 0 %
HCT: 37.8 % (ref 36.0–46.0)
Hemoglobin: 12.1 g/dL (ref 12.0–15.0)
LYMPHS PCT: 8 %
Lymphs Abs: 1.8 10*3/uL (ref 0.7–4.0)
MCH: 31.1 pg (ref 26.0–34.0)
MCHC: 32 g/dL (ref 30.0–36.0)
MCV: 97.2 fL (ref 78.0–100.0)
Monocytes Absolute: 1.8 10*3/uL — ABNORMAL HIGH (ref 0.1–1.0)
Monocytes Relative: 8 %
NEUTROS PCT: 84 %
Neutro Abs: 19.2 10*3/uL — ABNORMAL HIGH (ref 1.7–7.7)
PLATELETS: 434 10*3/uL — AB (ref 150–400)
RBC: 3.89 MIL/uL (ref 3.87–5.11)
RDW: 18.3 % — ABNORMAL HIGH (ref 11.5–15.5)
WBC: 22.8 10*3/uL — AB (ref 4.0–10.5)

## 2017-08-01 LAB — COMPREHENSIVE METABOLIC PANEL
ALBUMIN: 3.2 g/dL — AB (ref 3.5–5.0)
ALT: 36 U/L (ref 14–54)
AST: 45 U/L — AB (ref 15–41)
Alkaline Phosphatase: 72 U/L (ref 38–126)
Anion gap: 16 — ABNORMAL HIGH (ref 5–15)
BUN: 15 mg/dL (ref 6–20)
CHLORIDE: 104 mmol/L (ref 101–111)
CO2: 19 mmol/L — AB (ref 22–32)
Calcium: 8.6 mg/dL — ABNORMAL LOW (ref 8.9–10.3)
Creatinine, Ser: 1.01 mg/dL — ABNORMAL HIGH (ref 0.44–1.00)
GFR calc non Af Amer: 60 mL/min — ABNORMAL LOW (ref >60)
GLUCOSE: 164 mg/dL — AB (ref 65–99)
Potassium: 3.4 mmol/L — ABNORMAL LOW (ref 3.5–5.1)
SODIUM: 139 mmol/L (ref 135–145)
Total Bilirubin: 1.4 mg/dL — ABNORMAL HIGH (ref 0.3–1.2)
Total Protein: 6.5 g/dL (ref 6.5–8.1)

## 2017-08-01 LAB — STREP PNEUMONIAE URINARY ANTIGEN: STREP PNEUMO URINARY ANTIGEN: NEGATIVE

## 2017-08-01 LAB — I-STAT TROPONIN, ED: Troponin i, poc: 0.01 ng/mL (ref 0.00–0.08)

## 2017-08-01 LAB — CBG MONITORING, ED
GLUCOSE-CAPILLARY: 189 mg/dL — AB (ref 65–99)
Glucose-Capillary: 155 mg/dL — ABNORMAL HIGH (ref 65–99)
Glucose-Capillary: 133 mg/dL — ABNORMAL HIGH (ref 65–99)

## 2017-08-01 LAB — GLUCOSE, CAPILLARY: Glucose-Capillary: 470 mg/dL — ABNORMAL HIGH (ref 65–99)

## 2017-08-01 LAB — LACTIC ACID, PLASMA: LACTIC ACID, VENOUS: 1.5 mmol/L (ref 0.5–1.9)

## 2017-08-01 LAB — MRSA PCR SCREENING: MRSA by PCR: NEGATIVE

## 2017-08-01 LAB — BRAIN NATRIURETIC PEPTIDE: B Natriuretic Peptide: 232.6 pg/mL — ABNORMAL HIGH (ref 0.0–100.0)

## 2017-08-01 MED ORDER — DEXTROSE 5 % IV SOLN
2.0000 g | Freq: Once | INTRAVENOUS | Status: AC
  Administered 2017-08-01: 2 g via INTRAVENOUS
  Filled 2017-08-01: qty 2

## 2017-08-01 MED ORDER — AMLODIPINE BESYLATE 10 MG PO TABS
10.0000 mg | ORAL_TABLET | Freq: Every day | ORAL | Status: DC
  Administered 2017-08-01 – 2017-08-03 (×3): 10 mg via ORAL
  Filled 2017-08-01: qty 2
  Filled 2017-08-01 (×2): qty 1

## 2017-08-01 MED ORDER — SODIUM CHLORIDE 0.9% FLUSH
3.0000 mL | Freq: Two times a day (BID) | INTRAVENOUS | Status: DC
  Administered 2017-08-01 – 2017-08-02 (×2): 3 mL via INTRAVENOUS

## 2017-08-01 MED ORDER — INSULIN GLARGINE 100 UNIT/ML ~~LOC~~ SOLN
25.0000 [IU] | Freq: Every morning | SUBCUTANEOUS | Status: DC
  Administered 2017-08-02 – 2017-08-03 (×2): 25 [IU] via SUBCUTANEOUS
  Filled 2017-08-01 (×2): qty 0.25

## 2017-08-01 MED ORDER — INSULIN ASPART 100 UNIT/ML ~~LOC~~ SOLN
20.0000 [IU] | Freq: Three times a day (TID) | SUBCUTANEOUS | Status: DC
  Administered 2017-08-02 – 2017-08-03 (×4): 20 [IU] via SUBCUTANEOUS

## 2017-08-01 MED ORDER — ALBUTEROL SULFATE (2.5 MG/3ML) 0.083% IN NEBU: 2.5000 mg | INHALATION_SOLUTION | RESPIRATORY_TRACT | Status: DC | PRN

## 2017-08-01 MED ORDER — ASPIRIN EC 81 MG PO TBEC
81.0000 mg | DELAYED_RELEASE_TABLET | Freq: Every day | ORAL | Status: DC
  Administered 2017-08-01 – 2017-08-03 (×3): 81 mg via ORAL
  Filled 2017-08-01 (×3): qty 1

## 2017-08-01 MED ORDER — INSULIN ASPART 100 UNIT/ML FLEXPEN: 20.0000 [IU] | PEN_INJECTOR | Freq: Three times a day (TID) | SUBCUTANEOUS | Status: DC

## 2017-08-01 MED ORDER — DEXTROSE 5 % IV SOLN
1.0000 g | Freq: Three times a day (TID) | INTRAVENOUS | Status: DC
  Filled 2017-08-01: qty 1

## 2017-08-01 MED ORDER — POTASSIUM CHLORIDE CRYS ER 20 MEQ PO TBCR
20.0000 meq | EXTENDED_RELEASE_TABLET | Freq: Every day | ORAL | Status: DC
  Administered 2017-08-01 – 2017-08-03 (×3): 20 meq via ORAL
  Filled 2017-08-01 (×3): qty 1

## 2017-08-01 MED ORDER — INSULIN ASPART 100 UNIT/ML ~~LOC~~ SOLN
24.0000 [IU] | Freq: Once | SUBCUTANEOUS | Status: AC
  Administered 2017-08-01: 24 [IU] via SUBCUTANEOUS

## 2017-08-01 MED ORDER — FUROSEMIDE 10 MG/ML IJ SOLN
80.0000 mg | Freq: Once | INTRAMUSCULAR | Status: AC
  Administered 2017-08-01: 80 mg via INTRAVENOUS
  Filled 2017-08-01: qty 8

## 2017-08-01 MED ORDER — LIRAGLUTIDE 18 MG/3ML ~~LOC~~ SOPN
1.2000 mg | PEN_INJECTOR | Freq: Every day | SUBCUTANEOUS | Status: DC
  Administered 2017-08-02: 0.6 mg via SUBCUTANEOUS
  Filled 2017-08-01 (×2): qty 3

## 2017-08-01 MED ORDER — FUROSEMIDE 80 MG PO TABS
80.0000 mg | ORAL_TABLET | Freq: Every day | ORAL | Status: DC
  Administered 2017-08-01 – 2017-08-03 (×3): 80 mg via ORAL
  Filled 2017-08-01: qty 4
  Filled 2017-08-01 (×2): qty 1

## 2017-08-01 MED ORDER — METHYLPREDNISOLONE SODIUM SUCC 40 MG IJ SOLR
40.0000 mg | Freq: Four times a day (QID) | INTRAMUSCULAR | Status: DC
  Administered 2017-08-01 – 2017-08-02 (×4): 40 mg via INTRAVENOUS
  Filled 2017-08-01 (×4): qty 1

## 2017-08-01 MED ORDER — IOPAMIDOL (ISOVUE-370) INJECTION 76%
INTRAVENOUS | Status: AC
  Administered 2017-08-01: 100 mL
  Filled 2017-08-01: qty 100

## 2017-08-01 MED ORDER — SODIUM CHLORIDE 0.9% FLUSH: 3.0000 mL | INTRAVENOUS | Status: DC | PRN

## 2017-08-01 MED ORDER — BISACODYL 5 MG PO TBEC: 5.0000 mg | DELAYED_RELEASE_TABLET | Freq: Every day | ORAL | Status: DC | PRN

## 2017-08-01 MED ORDER — VANCOMYCIN HCL IN DEXTROSE 750-5 MG/150ML-% IV SOLN
750.0000 mg | Freq: Two times a day (BID) | INTRAVENOUS | Status: DC
  Administered 2017-08-01 – 2017-08-02 (×2): 750 mg via INTRAVENOUS
  Filled 2017-08-01 (×2): qty 150

## 2017-08-01 MED ORDER — METOPROLOL TARTRATE 25 MG PO TABS
25.0000 mg | ORAL_TABLET | Freq: Two times a day (BID) | ORAL | Status: DC
  Administered 2017-08-01 – 2017-08-03 (×5): 25 mg via ORAL
  Filled 2017-08-01 (×5): qty 1

## 2017-08-01 MED ORDER — IPRATROPIUM-ALBUTEROL 0.5-2.5 (3) MG/3ML IN SOLN
3.0000 mL | Freq: Four times a day (QID) | RESPIRATORY_TRACT | Status: DC
  Administered 2017-08-01 (×3): 3 mL via RESPIRATORY_TRACT
  Filled 2017-08-01 (×3): qty 3

## 2017-08-01 MED ORDER — VANCOMYCIN HCL 10 G IV SOLR
1500.0000 mg | Freq: Once | INTRAVENOUS | Status: AC
  Administered 2017-08-01: 1500 mg via INTRAVENOUS
  Filled 2017-08-01: qty 1500

## 2017-08-01 MED ORDER — ATORVASTATIN CALCIUM 80 MG PO TABS
80.0000 mg | ORAL_TABLET | Freq: Every evening | ORAL | Status: DC
  Administered 2017-08-01 – 2017-08-02 (×2): 80 mg via ORAL
  Filled 2017-08-01 (×2): qty 1

## 2017-08-01 MED ORDER — POTASSIUM CHLORIDE CRYS ER 20 MEQ PO TBCR
40.0000 meq | EXTENDED_RELEASE_TABLET | Freq: Once | ORAL | Status: AC
  Administered 2017-08-01: 40 meq via ORAL
  Filled 2017-08-01: qty 2

## 2017-08-01 MED ORDER — INSULIN ASPART 100 UNIT/ML ~~LOC~~ SOLN
0.0000 [IU] | Freq: Three times a day (TID) | SUBCUTANEOUS | Status: DC
  Administered 2017-08-01: 4 [IU] via SUBCUTANEOUS
  Administered 2017-08-02: 7 [IU] via SUBCUTANEOUS
  Administered 2017-08-02: 20 [IU] via SUBCUTANEOUS
  Administered 2017-08-02: 7 [IU] via SUBCUTANEOUS
  Administered 2017-08-03: 4 [IU] via SUBCUTANEOUS
  Filled 2017-08-01: qty 1

## 2017-08-01 MED ORDER — OXYCODONE-ACETAMINOPHEN 5-325 MG PO TABS: 1.0000 | ORAL_TABLET | ORAL | Status: DC | PRN

## 2017-08-01 MED ORDER — ORAL CARE MOUTH RINSE
15.0000 mL | Freq: Two times a day (BID) | OROMUCOSAL | Status: DC
  Administered 2017-08-01 – 2017-08-02 (×2): 15 mL via OROMUCOSAL

## 2017-08-01 MED ORDER — SODIUM CHLORIDE 0.9 % IV SOLN
250.0000 mL | INTRAVENOUS | Status: DC | PRN
  Administered 2017-08-01: 250 mL via INTRAVENOUS

## 2017-08-01 MED ORDER — INSULIN GLARGINE 300 UNIT/ML ~~LOC~~ SOPN: 25.0000 [IU] | PEN_INJECTOR | SUBCUTANEOUS | Status: DC

## 2017-08-01 MED ORDER — TAMSULOSIN HCL 0.4 MG PO CAPS
0.4000 mg | ORAL_CAPSULE | Freq: Every day | ORAL | Status: DC
  Administered 2017-08-01 – 2017-08-02 (×2): 0.4 mg via ORAL
  Filled 2017-08-01 (×2): qty 1

## 2017-08-01 MED ORDER — SODIUM CHLORIDE 0.9% FLUSH: 10.0000 mL | INTRAVENOUS | Status: DC | PRN

## 2017-08-01 MED ORDER — PIPERACILLIN-TAZOBACTAM 3.375 G IVPB
3.3750 g | Freq: Three times a day (TID) | INTRAVENOUS | Status: DC
  Administered 2017-08-01 – 2017-08-02 (×3): 3.375 g via INTRAVENOUS
  Filled 2017-08-01 (×4): qty 50

## 2017-08-01 MED ORDER — AZATHIOPRINE 50 MG PO TABS
25.0000 mg | ORAL_TABLET | Freq: Every day | ORAL | Status: DC
  Administered 2017-08-01 – 2017-08-03 (×3): 25 mg via ORAL
  Filled 2017-08-01 (×3): qty 1

## 2017-08-01 MED ORDER — METOCLOPRAMIDE HCL 5 MG PO TABS
5.0000 mg | ORAL_TABLET | Freq: Three times a day (TID) | ORAL | Status: DC
  Administered 2017-08-01 – 2017-08-03 (×6): 5 mg via ORAL
  Filled 2017-08-01 (×6): qty 1

## 2017-08-01 NOTE — Progress Notes (Signed)
Patient continues on 5L [place]. No distress noted at this time.

## 2017-08-01 NOTE — ED Notes (Signed)
ORDERED DIET TRAY FOR PT  

## 2017-08-01 NOTE — Progress Notes (Signed)
Pt arrived to 4e from [facility] ED. Vitals obtained. Telemetry box applied and CCMD notified. Pt oriented to room and staff. Pt denies needs at this time. Will continue current plan of care.  Berdine Dance BSN, RN

## 2017-08-01 NOTE — ED Notes (Signed)
Called CT, pt ready for transport to CT.

## 2017-08-01 NOTE — Progress Notes (Signed)
RT attempted ABG twice and got Venous both times. Blood was run as a Venous Blood Gas. Patient currently 99% on 50%FIO2.

## 2017-08-01 NOTE — Progress Notes (Addendum)
Pharmacy Antibiotic Note  Elizabeth Montes is a 62 y.o. female admitted on 08/01/2017 with pneumonia. Pharmacy has been consulted for cefepime and vancomycin dosing.  SCr 1.01, CrCl ~77ml/min. Weighs ~85 kg  Plan: Give cefepime 2g IV x 1, then start cefepime 1g IV Q8h Give vancomycin 1.5g IV x 1, then start vancomycin 750mg  IV Q12h Monitor clinical picture, renal function, VT prn F/U C&S, abx deescalation / LOT  ADDENDUM: Stop cefepime and start Zosyn 3.375 gm IV q8h (4 hour infusion) Continue vancomycin     No data recorded.  Recent Labs  Lab 08/01/17 0400 08/01/17 0704  WBC 22.8*  --   CREATININE 1.01*  --   LATICACIDVEN  --  1.5    CrCl cannot be calculated (Unknown ideal weight.).    Allergies  Allergen Reactions  . Lovenox [Enoxaparin Sodium] Other (See Comments)    "made me bleed"    Thank you for allowing pharmacy to be a part of this patient's care.  Armandina Stammer 08/01/2017 8:26 AM

## 2017-08-01 NOTE — ED Notes (Signed)
Patient taken off Bi-PAP, tolerating 6L [name] well at this time with SpO2 maintaining above 92%.

## 2017-08-01 NOTE — ED Provider Notes (Signed)
[facility] EMERGENCY DEPARTMENT Provider Note   CSN: [id number] Arrival date & time: 08/01/17  0330     History   Chief Complaint Chief Complaint  Patient presents with  . Shortness of Breath  . Emesis    HPI Elizabeth Montes is a 62 y.o. female.  Patient brought to the emergency department by EMS for evaluation of shortness of breath.  She comes from home.  Patient has a history of CHF and COPD.  She is on nasal cannula oxygen at 3 L continuously normally.  She started having shortness of breath tonight after smoking a cigarette.  She reports that she has not smoked in 2-1/2 years.  She denies any chest pain.  She has not had any significant coughing.  There has not been any fever.  She has noticed increased swelling of her legs.      Past Medical History:  Diagnosis Date  . CHF (congestive heart failure) (HCC)   . COPD (chronic obstructive pulmonary disease) (HCC)   . Diabetes mellitus without complication (HCC)   . Heart attack (HCC)   . Hypertension     Patient Active Problem List   Diagnosis Date Noted  . Acute respiratory failure with hypoxia (HCC) 08/01/2017  . Uncontrolled type 2 diabetes mellitus with hyperosmolar nonketotic hyperglycemia (HCC) 07/16/2017  . CHF, acute on chronic (HCC) 07/16/2017  . Acute on chronic respiratory failure with hypoxia (HCC) 07/16/2017  . COPD (chronic obstructive pulmonary disease) (HCC) 07/16/2017  . Hypertension 07/16/2017  . 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 Acute renal failure superimposed on stage 2 chronic kidney disease (HCC) 07/16/2017    Past Surgical History:  Procedure Laterality Date  . ABDOMINAL SURGERY    . TRACHEOSTOMY      OB History    No data available       Home Medications    Prior to Admission medications   Medication Sig Start Date End Date Taking? Authorizing Provider  amLODipine (NORVASC) 10 MG tablet Take 10 mg by mouth daily.    [provider]  aspirin EC 81 MG  tablet Take 81 mg by mouth daily.    [provider]  atorvastatin (LIPITOR) 80 MG tablet Take 80 mg by mouth daily.    [provider]  azaTHIOprine (IMURAN) 50 MG tablet Take 25 mg by mouth daily.    [provider]  fenofibrate 160 MG tablet Take 160 mg by mouth daily.    [provider]  furosemide (LASIX) 80 MG tablet Take 80 mg by mouth daily.    [provider]  insulin aspart (NOVOLOG FLEXPEN) 100 UNIT/ML FlexPen Inject 25 Units into the skin 3 (three) times daily with meals.    [provider]  Insulin Glargine (TOUJEO SOLOSTAR) 300 UNIT/ML SOPN Inject 30 Units into the skin every morning.    [provider]  metoCLOPramide (REGLAN) 10 MG tablet Take 5 mg by mouth 3 (three) times daily before meals.    [provider]  metoprolol tartrate (LOPRESSOR) 25 MG tablet Take 25 mg by mouth 2 (two) times daily.    [provider]  oxyCODONE-acetaminophen (PERCOCET/ROXICET) 5-325 MG tablet Take 1 tablet by mouth every 4 (four) hours as needed for severe pain.    [provider]  predniSONE (DELTASONE) 50 MG tablet Take 1 tablet (50 mg total) by mouth daily with breakfast. Patient taking differently: Take 40 mg by mouth daily with breakfast.  12/19/16   Jene Every, MD  tamsulosin (FLOMAX) 0.4 MG CAPS capsule Take 0.4 mg by mouth daily after supper.    [provider]  tiotropium (SPIRIVA HANDIHALER) 18 MCG inhalation capsule Place 18 mcg into inhaler and inhale daily.    [provider]    Family History No family history on file.  Social History Social History   Tobacco Use  . Smoking status: Never Smoker  . Smokeless tobacco: Never Used  Substance Use Topics  . Alcohol use: No  . Drug use: No     Allergies   Lovenox [enoxaparin sodium]   Review of Systems Review of Systems  Respiratory: Positive for shortness of breath.   Cardiovascular: Positive for leg swelling.    All other systems reviewed and are negative.    Physical Exam Updated Vital Signs BP 127/66   Pulse (!) 102   Resp (!) 32   SpO2 99%   Physical Exam  Constitutional: She is oriented to person, place, and time. She appears well-developed and well-nourished. No distress.  HENT:  Head: Normocephalic and atraumatic.  Right Ear: Hearing normal.  Left Ear: Hearing normal.  Nose: Nose normal.  Mouth/Throat: Oropharynx is clear and moist and mucous membranes are normal.  Eyes: Conjunctivae and EOM are normal. Pupils are equal, round, and reactive to light.  Neck: Normal range of motion. Neck supple.  Cardiovascular: Regular rhythm, S1 normal and S2 normal. Exam reveals no gallop and no friction rub.  No murmur heard. Pulmonary/Chest: Accessory muscle usage present. She has decreased breath sounds. She has rales. She exhibits no tenderness.  Abdominal: Soft. Normal appearance and bowel sounds are normal. There is no hepatosplenomegaly. There is no tenderness. There is no rebound, no guarding, no tenderness at McBurney's point and negative Murphy's sign. No hernia.  Musculoskeletal: Normal range of motion.  Neurological: She is alert and oriented to person, place, and time. She has normal strength. No cranial nerve deficit or sensory deficit. Coordination normal. GCS eye subscore is 4. GCS verbal subscore is 5. GCS motor subscore is 6.  Skin: Skin is warm, dry and intact. No rash noted. No cyanosis.  Psychiatric: She has a normal mood and affect. Her speech is normal and behavior is normal. Thought content normal.  Nursing note and vitals reviewed.    ED Treatments / Results  Labs (all labs ordered are listed, but only abnormal results are displayed) Labs Reviewed  CBC WITH DIFFERENTIAL/PLATELET - Abnormal; Notable for the following components:      Result Value   WBC 22.8 (*)    RDW 18.3 (*)    Platelets 434 (*)    Neutro Abs 19.2 (*)    Monocytes Absolute 1.8 (*)    All other  components within normal limits  COMPREHENSIVE METABOLIC PANEL - Abnormal; Notable for the following components:   Potassium 3.4 (*)    CO2 19 (*)    Glucose, Bld 164 (*)    Creatinine, Ser 1.01 (*)    Calcium 8.6 (*)    Albumin 3.2 (*)    AST 45 (*)    Total Bilirubin 1.4 (*)    GFR calc non Af Amer 60 (*)    Anion gap 16 (*)    All other components within normal limits  CBG MONITORING, ED - Abnormal; Notable for the following components:   Glucose-Capillary 155 (*)    All other components within normal limits  I-STAT VENOUS BLOOD GAS, ED - Abnormal; Notable for the following components:   pH, Ven 7.434 (*)  pCO2, Ven 38.2 (*)    pO2, Ven 22.0 (*)    All other components within normal limits  CULTURE, BLOOD (ROUTINE X 2)  CULTURE, BLOOD (ROUTINE X 2)  LACTIC ACID, PLASMA  BRAIN NATRIURETIC PEPTIDE  I-STAT TROPONIN, ED  I-STAT ARTERIAL BLOOD GAS, ED    EKG  EKG Interpretation  Date/Time:  Sunday August 01 2017 04:20:57 EST Ventricular Rate:  119 PR Interval:    QRS Duration: 88 QT Interval:  373 QTC Calculation: 525 R Axis:   1 Text Interpretation:  Sinus tachycardia Probable left atrial enlargement Minimal ST depression, lateral leads Prolonged QT interval No significant change since last tracing Confirmed by Gilda Crease [phone]) on 08/01/2017 4:27:30 AM       Radiology Ct Angio Chest Pe W Or Wo Contrast  Result Date: 08/01/2017 CLINICAL DATA:  62 year old female with acute shortness of breath today. EXAM: CT ANGIOGRAPHY CHEST WITH CONTRAST TECHNIQUE: Multidetector CT imaging of the chest was performed using the standard protocol during bolus administration of intravenous contrast. Multiplanar CT image reconstructions and MIPs were obtained to evaluate the vascular anatomy. CONTRAST:  ISOVUE-370 IOPAMIDOL (ISOVUE-370) INJECTION 76% COMPARISON:  08/01/2017 chest radiograph, 12/19/2016 chest CT and prior studies FINDINGS: Cardiovascular: This is a  technically satisfactory study for evaluation of pulmonary emboli. No pulmonary emboli are identified. Cardiomegaly and coronary artery atherosclerotic calcifications again noted. Thoracic aortic atherosclerotic calcifications and plaque noted without aneurysm. No pericardial effusion. Mediastinum/Nodes: Mediastinal and bilateral hilar lymph nodes are unchanged given technique. A 8 mm right peritracheal lymph node (image 34, series 5) and a 1.5 cm right hilar node (image 52, series 5) are again identified. No mediastinal mass or other mediastinal abnormality. Lungs/Pleura: Mild central ground-glass opacities and increased peripheral and tears DISH stool opacities may represent edema. Bilateral lower lobe ground-glass opacities are identified as well as right lower lobe airspace disease/consolidation. Septal/ peripheral interstitial prominence within the mid and lower lungs are again noted. No pleural effusion or pneumothorax. No discrete mass identified. Upper Abdomen: No acute abnormality. Musculoskeletal: No acute abnormality or suspicious bony lesion. Cervical spine fusion hardware again noted. Review of the MIP images confirms the above findings. IMPRESSION: 1. No evidence of pulmonary emboli or thoracic aortic aneurysm. 2. Central ground-glass opacities and increased interstitial opacities bilaterally, nonspecific but may represent edema. 3. More focal right lower lobe airspace disease/consolidation which may represent pneumonia. 4. Findings suspicious for interstitial lung disease again noted. 5. Cardiomegaly and coronary artery disease 6. Aortic Atherosclerosis (ICD10-I70.0) and Emphysema (ICD10-J43.9). Electronically Signed   By: Harmon Pier M.D.   On: 08/01/2017 08:14   Dg Chest Port 1 View  Result Date: 08/01/2017 CLINICAL DATA:  62 y/o F; shortness of breath. History of COPD and congestive heart failure. EXAM: PORTABLE CHEST 1 VIEW COMPARISON:  07/15/2017 chest radiograph FINDINGS: Stable normal  cardiac silhouette given projection and technique. Left PICC line tip projects over upper SVC. Aortic atherosclerosis with calcification. COPD. Coarse reticular markings in the lung bases. No consolidation. No pleural effusion or pneumothorax. No acute osseous abnormality is evident. Anterior cervical discectomy and fusion hardware. IMPRESSION: Increase coarse reticular markings in lung bases may represent progression of suspected interstitial lung disease on prior CT. Possible infiltrate in left lung base. Electronically Signed   By: Mitzi Hansen M.D.   On: 08/01/2017 04:14    Procedures Procedures (including critical care time)  Medications Ordered in ED Medications  furosemide (LASIX) injection 80 mg (not administered)  ceFEPIme (MAXIPIME) 2 g in  dextrose 5 % 50 mL IVPB (not administered)  vancomycin (VANCOCIN) 1,500 mg in sodium chloride 0.9 % 500 mL IVPB (not administered)  iopamidol (ISOVUE-370) 76 % injection (100 mLs  Contrast Given 08/01/17 0729)     Initial Impression / Assessment and Plan / ED Course  I have reviewed the triage vital signs and the nursing notes.  Pertinent labs & imaging results that were available during my care of the patient were reviewed by me and considered in my medical decision making (see chart for details).     Patient presents to the emergency department for evaluation of difficulty breathing.  She is brought to the hospital by ambulance from home.  Patient reports progressively worsening difficulty breathing through the night.  She has history of COPD and CHF.  Patient was very hypoxic at arrival, could not maintain her oxygen saturations on nasal cannula O2.  She was placed on BiPAP and had significant improvement with her oxygenation.  She has done well on the BiPAP.  Chest x-ray did show some possible interstitial edema as well as potential infiltrate at the left base.  The x-ray, however, did not look abnormal enough to explain her amount  of hypoxia.  She therefore underwent CT angiography.  This shows a larger extent of edema as well as pneumonia.  Patient initiated on Lasix, cefepime, vancomycin and will be admitted to the hospital.  CRITICAL CARE Performed by: Gilda Crease.   Total critical care time: 30 minutes  Critical care time was exclusive of separately billable procedures and treating other patients.  Critical care was necessary to treat or prevent imminent or life-threatening deterioration.  Critical care was time spent personally by me on the following activities: development of treatment plan with patient and/or surrogate as well as nursing, discussions with consultants, evaluation of patient's response to treatment, examination of patient, obtaining history from patient or surrogate, ordering and performing treatments and interventions, ordering and review of laboratory studies, ordering and review of radiographic studies, pulse oximetry and re-evaluation of patient's condition.   Final Clinical Impressions(s) / ED Diagnoses   Final diagnoses:  Acute on chronic congestive heart failure, unspecified heart failure type (HCC)  Healthcare-associated pneumonia  Acute respiratory failure with hypoxia [facility])    ED Discharge Orders    None       Gilda Crease, MD 08/01/17 [phone]

## 2017-08-01 NOTE — ED Notes (Signed)
Called Lab, they are currently running the BMP

## 2017-08-01 NOTE — ED Notes (Addendum)
Family informed RN that 25 units of Nova-log was given just prior to EMS arrival.

## 2017-08-01 NOTE — H&P (Signed)
History and Physical    TONY FRISCIA ZOX:[id number] DOB: 10/31/1955 DOA: 08/01/2017  PCP: Patient, No Pcp Per Patient coming from: Home  Chief Complaint: Shortness of breath  HPI: RIONNA FELTES is a 62 y.o. 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 Subsequent to that she developed shortness of breath and respiratory distress and presented to the emergency room.  Patient states that she had done well since discharge after treatment for CHF 2 weeks ago and had returned to baseline. Her baseline is primarily sitting in her chair and walking to the bathroom. She has her meals brought to her by her daughter and granddaughter with whom she lives.  Patient denies fevers but does admit to feeling chilly for the past day. She notes she has had a cough which she has been treating with Robitussin with good effect. Patient admits that she is not compliant with her COPD medications and uses her Symbicort and her other medications as needed. She has not used them recently. Patient notes she stopped smoking 2 years ago and has not had a cigarette since.  ED Course: Patient was brought to the emergency room on a nonrebreather mask and attempts to wean down were unsuccessful. Patient was placed on BiPAP and has tolerated this well. CT scan is notable for central ground glass opacities with some increased interstitial opacities which may represent edema. She also has a focal right lower lobe airspace disease consistent with pneumonia.  Review of Systems: As per HPI otherwise all other systems reviewed and are negative  Ambulatory Status: The patient is minimally ambulatory at home, is able to ambulate to the bathroom on 3 L of oxygen.  Past Medical History:  Diagnosis Date  . CHF (congestive  heart failure) (HCC)   . COPD (chronic obstructive pulmonary disease) (HCC)   . Diabetes mellitus without complication (HCC)   . Heart attack (HCC)   . Hypertension     Past Surgical History:  Procedure Laterality Date  . ABDOMINAL SURGERY    . TRACHEOSTOMY      Social History   Socioeconomic History  . Marital status: Single    Spouse name: Not on file  . Number of children: Not on file  . Years of education: Not on file  . Highest education level: Not on file  Social Needs  . Financial resource strain: Not on file  . Food insecurity - worry: Not on file  . Food insecurity - inability: Not on file  . Transportation needs - medical: Not on file  . Transportation needs - non-medical: Not on file  Occupational History  . Not on file  Tobacco Use  . Smoking status: Never Smoker  . Smokeless tobacco: Never Used  Substance and Sexual Activity  . Alcohol use: No  . Drug use: No  . Sexual activity: Not on file  Other Topics Concern  . Not on file  Social History Narrative  . Not on file    Allergies  Allergen Reactions  . Lovenox [Enoxaparin Sodium] Other (See Comments)    "made me bleed"    Prior to Admission medications   Medication Sig Start Date End Date Taking? Authorizing Provider  amLODipine (NORVASC) 10 MG tablet Take 10 mg by mouth daily.    [provider]  aspirin EC  81 MG tablet Take 81 mg by mouth daily.    [provider]  atorvastatin (LIPITOR) 80 MG tablet Take 80 mg by mouth daily.    [provider]  azaTHIOprine (IMURAN) 50 MG tablet Take 25 mg by mouth daily.    [provider]  fenofibrate 160 MG tablet Take 160 mg by mouth daily.    [provider]  furosemide (LASIX) 80 MG tablet Take 80 mg by mouth daily.    [provider]  insulin aspart (NOVOLOG FLEXPEN) 100 UNIT/ML FlexPen Inject 25 Units into the skin 3 (three) times daily with meals.    [provider]  Insulin Glargine  (TOUJEO SOLOSTAR) 300 UNIT/ML SOPN Inject 30 Units into the skin every morning.    [provider]  metoCLOPramide (REGLAN) 10 MG tablet Take 5 mg by mouth 3 (three) times daily before meals.    [provider]  metoprolol tartrate (LOPRESSOR) 25 MG tablet Take 25 mg by mouth 2 (two) times daily.    [provider]  oxyCODONE-acetaminophen (PERCOCET/ROXICET) 5-325 MG tablet Take 1 tablet by mouth every 4 (four) hours as needed for severe pain.    [provider]  predniSONE (DELTASONE) 50 MG tablet Take 1 tablet (50 mg total) by mouth daily with breakfast. Patient taking differently: Take 40 mg by mouth daily with breakfast.  12/19/16   Jene Every, MD  tamsulosin (FLOMAX) 0.4 MG CAPS capsule Take 0.4 mg by mouth daily after supper.    [provider]  tiotropium (SPIRIVA HANDIHALER) 18 MCG inhalation capsule Place 18 mcg into inhaler and inhale daily.    [provider]    Physical Exam: Vitals:   08/01/17 0700 08/01/17 0730 08/01/17 0735 08/01/17 0800  BP: 113/63 (!) 123/59  127/66  Pulse: (!) 107 (!) 102 (!) 102 (!) 102  Resp: (!) 25 (!) 31 (!) 30 (!) 32  TempSrc:      SpO2: 99% 100% 100% 99%     General: Cushingoid patient with BiPAP in place lying flat on back breathing at 36 times a minute nonlabored. Eyes:  PERRL, EOMI, normal lids, iris Neck: Thick, supple Cardiovascular: RR, no m/r/g. Trace to 1+ LE edema.  Respiratory: Mechanical breath sounds from BiPAP  Abdomen: Obese, rounded and protuberant, soft, ntnd, NABS Skin: no rash or induration seen on limited exam Psychiatric: Sleepy but grossly normal mood and affect, speech fluent and appropriate, AOx3 Neurologic: Patient quite sleepy however arousable to loud voice and touch. She is alert and oriented after she is woken. She admits to being very tired. She is moving all extremities. No focal abnormalities noted.   Labs on Admission: I have personally reviewed following  labs and imaging studies  CBC: Recent Labs  Lab 08/01/17 0400  WBC 22.8*  NEUTROABS 19.2*  HGB 12.1  HCT 37.8  MCV 97.2  PLT 434*   Basic Metabolic Panel: Recent Labs  Lab 08/01/17 0400  NA 139  K 3.4*  CL 104  CO2 19*  GLUCOSE 164*  BUN 15  CREATININE 1.01*  CALCIUM 8.6*   GFR: CrCl cannot be calculated (Unknown ideal weight.). Liver Function Tests: Recent Labs  Lab 08/01/17 0400  AST 45*  ALT 36  ALKPHOS 72  BILITOT 1.4*  PROT 6.5  ALBUMIN 3.2*   No results for input(s): LIPASE, AMYLASE in the last 168 hours. No results for input(s): AMMONIA in the last 168 hours. Coagulation Profile: No results for input(s): INR, PROTIME in the last  168 hours. Cardiac Enzymes: No results for input(s): CKTOTAL, CKMB, CKMBINDEX, TROPONINI in the last 168 hours. BNP (last 3 results) No results for input(s): PROBNP in the last 8760 hours. HbA1C: No results for input(s): HGBA1C in the last 72 hours. CBG: Recent Labs  Lab 08/01/17 0355  GLUCAP 155*   Lipid Profile: No results for input(s): CHOL, HDL, LDLCALC, TRIG, CHOLHDL, LDLDIRECT in the last 72 hours. Thyroid Function Tests: No results for input(s): TSH, T4TOTAL, FREET4, T3FREE, THYROIDAB in the last 72 hours. Anemia Panel: No results for input(s): VITAMINB12, FOLATE, FERRITIN, TIBC, IRON, RETICCTPCT in the last 72 hours. Urine analysis:    Component Value Date/Time   COLORURINE YELLOW 07/16/2017 0526   APPEARANCEUR CLEAR 07/16/2017 0526   LABSPEC 1.010 07/16/2017 0526   PHURINE 5.0 07/16/2017 0526   GLUCOSEU >=500 (A) 07/16/2017 0526   HGBUR NEGATIVE 07/16/2017 0526   BILIRUBINUR NEGATIVE 07/16/2017 0526   KETONESUR NEGATIVE 07/16/2017 0526   PROTEINUR NEGATIVE 07/16/2017 0526   NITRITE NEGATIVE 07/16/2017 0526   LEUKOCYTESUR NEGATIVE 07/16/2017 0526    Creatinine Clearance: CrCl cannot be calculated (Unknown ideal weight.).  Sepsis Labs: @LABRCNTIP (procalcitonin:4,lacticidven:4) )No results found  for this or any previous visit (from the past 240 hour(s)).   Radiological Exams on Admission: Ct Angio Chest Pe W Or Wo Contrast  Result Date: 08/01/2017 CLINICAL DATA:  62 year old female with acute shortness of breath today. EXAM: CT ANGIOGRAPHY CHEST WITH CONTRAST TECHNIQUE: Multidetector CT imaging of the chest was performed using the standard protocol during bolus administration of intravenous contrast. Multiplanar CT image reconstructions and MIPs were obtained to evaluate the vascular anatomy. CONTRAST:  ISOVUE-370 IOPAMIDOL (ISOVUE-370) INJECTION 76% COMPARISON:  08/01/2017 chest radiograph, 12/19/2016 chest CT and prior studies FINDINGS: Cardiovascular: This is a technically satisfactory study for evaluation of pulmonary emboli. No pulmonary emboli are identified. Cardiomegaly and coronary artery atherosclerotic calcifications again noted. Thoracic aortic atherosclerotic calcifications and plaque noted without aneurysm. No pericardial effusion. Mediastinum/Nodes: Mediastinal and bilateral hilar lymph nodes are unchanged given technique. A 8 mm right peritracheal lymph node (image 34, series 5) and a 1.5 cm right hilar node (image 52, series 5) are again identified. No mediastinal mass or other mediastinal abnormality. Lungs/Pleura: Mild central ground-glass opacities and increased peripheral and tears DISH stool opacities may represent edema. Bilateral lower lobe ground-glass opacities are identified as well as right lower lobe airspace disease/consolidation. Septal/ peripheral interstitial prominence within the mid and lower lungs are again noted. No pleural effusion or pneumothorax. No discrete mass identified. Upper Abdomen: No acute abnormality. Musculoskeletal: No acute abnormality or suspicious bony lesion. Cervical spine fusion hardware again noted. Review of the MIP images confirms the above findings. IMPRESSION: 1. No evidence of pulmonary emboli or thoracic aortic aneurysm. 2. Central  ground-glass opacities and increased interstitial opacities bilaterally, nonspecific but may represent edema. 3. More focal right lower lobe airspace disease/consolidation which may represent pneumonia. 4. Findings suspicious for interstitial lung disease again noted. 5. Cardiomegaly and coronary artery disease 6. Aortic Atherosclerosis (ICD10-I70.0) and Emphysema (ICD10-J43.9). Electronically Signed   By: Harmon Pier M.D.   On: 08/01/2017 08:14   Dg Chest Port 1 View  Result Date: 08/01/2017 CLINICAL DATA:  62 y/o F; shortness of breath. History of COPD and congestive heart failure. EXAM: PORTABLE CHEST 1 VIEW COMPARISON:  07/15/2017 chest radiograph FINDINGS: Stable normal cardiac silhouette given projection and technique. Left PICC line tip projects over upper SVC. Aortic atherosclerosis with calcification. COPD. Coarse reticular markings in the lung bases. No  consolidation. No pleural effusion or pneumothorax. No acute osseous abnormality is evident. Anterior cervical discectomy and fusion hardware. IMPRESSION: Increase coarse reticular markings in lung bases may represent progression of suspected interstitial lung disease on prior CT. Possible infiltrate in left lung base. Electronically Signed   By: Mitzi Hansen M.D.   On: 08/01/2017 04:14    EKG: Independently reviewed. Sinus rhythm at 120. Q in III and F. Biphasic P wave in V1. Large voltage V2 V3. No acute ST-T wave changes.  Assessment/Plan Principal Problem:   Acute respiratory failure with hypoxia (HCC) Active Problems:   CHF, acute on chronic (HCC)   COPD (chronic obstructive pulmonary disease) (HCC)   HAP (hospital-acquired pneumonia)   Diabetes mellitus (HCC)    ACUTE RESPIRATORY FAILURE Patient has extremely poor respiratory reserve from both obstructive and restrictive pulmonary disease and now has HCHP complicated by some level of pulmonary edema.  She has known interstitial pulmonary fibrosis, is O2 dependent, is  steroid dependent although it's unclear to me if the steroids are for her respiratory status or her polymyositis.She is essentially chair bound although is able to walk to the bathroom with her 3 L of oxygen.   It sounds like she probably had an aspiration event when she choked on her hotdog and this likely has initiated her acute respiratory failure. She does indeed have airspace abnormality on CT consistent with this.  Patient is doing well on BiPAP, will continue.  Patient is steroid dependent at home on Prednisone 20 mg daily so will cover with stress dose steroids: Solu-Medrol 40 mg every 6 hours. Antibiotics as noted below.  HCAP  Patient is immunocompromised on Imuran as well as chronic prednisone She does not appear to be septic at present. Vancomycin and cefepime have been initiated in the emergency room for HCAP.  Will continue vancomycin Will change cefepime to zosyn to cover anaerobes for possible aspiration.  Will need to watch kidney function carefully with a combination of Vanc and Zosyn.  COPD Oxygen and steroid dependent  Albuterol and ipratropium nebulizers every 6 hours standing Albuterol every 2 when necessary Solumedrol 40 every 6h as noted above.   CHF Patient has known CHF with some evidence of edema on CT scan today. Will treat with Lasix 80 mg IV 1 now IV Lasix can be continued as warranted, she is usually on Lasix 80 mg by mouth daily which has been continued.  HYPOKALEMIA K Dur 40 mg by mouth 1 now Continue home doses of K-Dur 20 Miller grams by mouth daily  Recheck and replete as needed  IDDM Heart healthy car modified diet Continue glargine per home doses 20 units in the morning Continue aspart 20 units every before meals per home doses Continue liraglutide Add sliding scale insulin before meals and at bedtime  IPF Has known groundglass opacity on CT Oxygen and BiPAP support as noted above.  POLYMYOSITIS Continue immuran and steroids as noted  above  HTN Continue amlodipine and metoprolol per home doses     DVT prophylaxis: Apparently allergic to Enoxaparen due to bleeding, will provide SCD Code Status: Full  Family Communication: Patient lives with daughter who is aware that she is being admitted to the hospital  Disposition Plan: Home  Consults called: None  Admission status: Inpatient, step down    Pieter Partridge MD [facility]  If 7PM-7AM, please contact night-coverage [email or web address] Password [facility]  08/01/2017, 8:37 AM

## 2017-08-01 NOTE — ED Triage Notes (Signed)
Pt began to experience SOB for 2 hours after smoking 3 cigarrets.  While she is on 3 L O2 at home her initial O2 level upon EMS arrival was 60%, lungs clear for EMS  EMS placed non-rebreather 98%. Pt's family gave 25 units of insulin before EMS arrived, CBG for EMS was 260.  Pt given 4mg  zofran, emesis X2

## 2017-08-01 NOTE — ED Notes (Signed)
Patient sitting up in bed talking on phone with her daughter. Lunch tray provided at bedside. Patient denies nausea, tolerating well.

## 2017-08-01 NOTE — ED Notes (Signed)
RN assess power port.

## 2017-08-01 NOTE — Progress Notes (Signed)
Placed second call to on-call physician, Blount, regarding patient's elevated blood sugar of 470. First text sent ~22:15. Will continue to monitor.

## 2017-08-01 NOTE — Progress Notes (Signed)
Patient insistent on removing BiPAP at this time. Placed patient on 6 Lpm nasal cannula with sats maintaining at 94%. RN made aware. Will continue to monitor.

## 2017-08-01 NOTE — ED Notes (Signed)
Called LAB, new BNP blood has been sent down.

## 2017-08-01 NOTE — ED Notes (Signed)
HW label sent to main lab for cmp, cbc /diff, bnp blood test.

## 2017-08-01 NOTE — ED Notes (Signed)
Pt CBG 133. Nurse was notified.

## 2017-08-01 NOTE — ED Notes (Signed)
Pt's daughter leaving, gave contact information: [place] [phone] and [phone] Narda Amber [phone]

## 2017-08-02 LAB — CBC WITH DIFFERENTIAL/PLATELET
BASOS PCT: 0 %
Basophils Absolute: 0 10*3/uL (ref 0.0–0.1)
EOS PCT: 0 %
Eosinophils Absolute: 0 10*3/uL (ref 0.0–0.7)
HCT: 33.3 % — ABNORMAL LOW (ref 36.0–46.0)
Hemoglobin: 10.8 g/dL — ABNORMAL LOW (ref 12.0–15.0)
LYMPHS ABS: 0.7 10*3/uL (ref 0.7–4.0)
Lymphocytes Relative: 5 %
MCH: 31.8 pg (ref 26.0–34.0)
MCHC: 32.4 g/dL (ref 30.0–36.0)
MCV: 97.9 fL (ref 78.0–100.0)
MONO ABS: 0.3 10*3/uL (ref 0.1–1.0)
Monocytes Relative: 2 %
NEUTROS ABS: 12.6 10*3/uL — AB (ref 1.7–7.7)
Neutrophils Relative %: 93 %
PLATELETS: 441 10*3/uL — AB (ref 150–400)
RBC: 3.4 MIL/uL — ABNORMAL LOW (ref 3.87–5.11)
RDW: 18.3 % — AB (ref 11.5–15.5)
WBC: 13.6 10*3/uL — ABNORMAL HIGH (ref 4.0–10.5)

## 2017-08-02 LAB — GLUCOSE, CAPILLARY
GLUCOSE-CAPILLARY: 291 mg/dL — AB (ref 65–99)
GLUCOSE-CAPILLARY: 235 mg/dL — AB (ref 65–99)
Glucose-Capillary: 454 mg/dL — ABNORMAL HIGH (ref 65–99)
Glucose-Capillary: 373 mg/dL — ABNORMAL HIGH (ref 65–99)
Glucose-Capillary: 249 mg/dL — ABNORMAL HIGH (ref 65–99)
Glucose-Capillary: 402 mg/dL — ABNORMAL HIGH (ref 65–99)
Glucose-Capillary: 252 mg/dL — ABNORMAL HIGH (ref 65–99)

## 2017-08-02 LAB — BASIC METABOLIC PANEL
Anion gap: 12 (ref 5–15)
BUN: 16 mg/dL (ref 6–20)
CALCIUM: 8.4 mg/dL — AB (ref 8.9–10.3)
CHLORIDE: 103 mmol/L (ref 101–111)
CO2: 22 mmol/L (ref 22–32)
CREATININE: 1.18 mg/dL — AB (ref 0.44–1.00)
GFR calc Af Amer: 57 mL/min — ABNORMAL LOW (ref >60)
GFR calc non Af Amer: 49 mL/min — ABNORMAL LOW (ref >60)
GLUCOSE: 317 mg/dL — AB (ref 65–99)
Potassium: 4.2 mmol/L (ref 3.5–5.1)
Sodium: 137 mmol/L (ref 135–145)

## 2017-08-02 LAB — HIV ANTIBODY (ROUTINE TESTING W REFLEX): HIV SCREEN 4TH GENERATION: NONREACTIVE

## 2017-08-02 MED ORDER — LEVOFLOXACIN 500 MG PO TABS
500.0000 mg | ORAL_TABLET | Freq: Every day | ORAL | Status: DC
  Filled 2017-08-02: qty 1

## 2017-08-02 MED ORDER — IPRATROPIUM-ALBUTEROL 0.5-2.5 (3) MG/3ML IN SOLN
3.0000 mL | Freq: Three times a day (TID) | RESPIRATORY_TRACT | Status: DC
  Administered 2017-08-02 (×2): 3 mL via RESPIRATORY_TRACT
  Filled 2017-08-02 (×4): qty 3

## 2017-08-02 MED ORDER — BENZONATATE 100 MG PO CAPS
100.0000 mg | ORAL_CAPSULE | Freq: Three times a day (TID) | ORAL | Status: DC | PRN
  Administered 2017-08-02 – 2017-08-03 (×2): 100 mg via ORAL
  Filled 2017-08-02 (×2): qty 1

## 2017-08-02 MED ORDER — PREDNISONE 20 MG PO TABS
20.0000 mg | ORAL_TABLET | Freq: Every day | ORAL | Status: DC
  Administered 2017-08-03: 20 mg via ORAL
  Filled 2017-08-02: qty 1

## 2017-08-02 MED ORDER — DEXTROSE 5 % IV SOLN
100.0000 mg | Freq: Two times a day (BID) | INTRAVENOUS | Status: DC
Start: 2017-08-02 — End: 2017-08-03
  Administered 2017-08-02 – 2017-08-03 (×2): 100 mg via INTRAVENOUS
  Filled 2017-08-02 (×3): qty 100

## 2017-08-02 NOTE — Progress Notes (Addendum)
Pharmacy Antibiotic Note  Elizabeth Montes is a 62 y.o. female admitted on 08/01/2017 with pneumonia. Pharmacy has been consulted for vanc/zosyn dosing.  On day #2 of antibiotics. WBC trending downward from 22.8 to 13.6. Scr increased slightly to 1.18 (CrCl ~53 mL/min). Cultures are negative so far. EKG showing QTc >500 in setting of tachycardia. Discussed with MD and plan to use doxycyline instead of levofloxacin.  Plan: Start doxycyline 100 mg every 12 hours  F/U C&S, abx deescalation / LOT  Height: 5\' 3"  (160 cm) Weight: 192 lb 10.9 oz (87.4 kg) IBW/kg (Calculated) : 52.4  Temp (24hrs), Avg:97.6 F (36.4 C), Min:97.2 F (36.2 C), Max:98 F (36.7 C)  Recent Labs  Lab 08/01/17 0400 08/01/17 0704 08/02/17 0337  WBC 22.8*  --  13.6*  CREATININE 1.01*  --  1.18*  LATICACIDVEN  --  1.5  --     Estimated Creatinine Clearance: 53.8 mL/min (A) (by C-G formula based on SCr of 1.18 mg/dL (H)).    Allergies  Allergen Reactions  . Lovenox [Enoxaparin Sodium] Other (See Comments)    "made me bleed"    Thank you for allowing pharmacy to be a part of this patient's care.  Girard Cooter, PharmD Clinical Pharmacist  Pager: [phone] Clinical Phone for 08/02/2017 until 3:30pm: [phone] If after 3:30pm, please call main pharmacy at [phone] 08/02/2017 1:51 PM

## 2017-08-02 NOTE — Progress Notes (Signed)
PROGRESS NOTE    SAMANDA BUSKE  ZOX:[id number] DOB: 03-Aug-1955 DOA: 08/01/2017 PCP: Patient, No Pcp Per   Brief Narrative:  62 y.o. 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 Subsequent to that she developed shortness of breath and respiratory distress and presented to the emergency room.   Assessment & Plan:   Principal Problem:   Acute respiratory failure with hypoxia (HCC) - Patient reporting improvement in respiratory condition - We'll place on her oral steroid regimen as there was no wheezing on exam and also de-escalate antibiotics and place on doxycycline. Should her condition continued to improve on this regimen we'll plan on discharging her next a.m. - Most likely secondary to aspiration event  Active Problems:   CHF, acute on chronic (HCC) - Was given IV Lasix in the ED. Patient does not appear fluid overloaded. - Continue prior to admission diuresis regimen    COPD (chronic obstructive pulmonary disease) (HCC) - Does not appear to be COPD exacerbation on exam. Will place back on home oral steroids - Continue maintenance medication    Healthcare-associated pneumonia - I suspect acute respiratory failure secondary to hypoxia most likely secondary to aspiration event. Patient reports nearing baseline    Diabetes mellitus (HCC) - Continue carb modified diet, Lantus, and sliding scale insulin   DVT prophylaxis: SCD order placed pt allergic to enoxaparin Code Status: Full Family Communication: d/c patient directly Disposition Plan: pending improvement in condition   Consultants:   None   Procedures: none   Antimicrobials: doxycycline   Subjective: Pt has no new complaints reported today. States her breathing situation is near  baseline  Objective: Vitals:   08/02/17 0824 08/02/17 0929 08/02/17 1040 08/02/17 1202  BP:   (!) 130/52 (!) 129/53  Pulse:   98 72  Resp:    19  Temp:      TempSrc:    Oral  SpO2: 93% 95%  93%  Weight:      Height:        Intake/Output Summary (Last 24 hours) at 08/02/2017 1601 Last data filed at 08/02/2017 1203 Gross per 24 hour  Intake 465 ml  Output 750 ml  Net -285 ml   Filed Weights   08/01/17 1530 08/02/17 0208  Weight: 87.9 kg (193 lb 12.6 oz) 87.4 kg (192 lb 10.9 oz)    Examination:  General exam: Appears calm and comfortable , no acute distress Respiratory system: Clear to auscultation. Respiratory effort normal. No wheezes Cardiovascular system: S1 & S2 heard, RRR. No JVD, murmurs, rubs, no gallops Gastrointestinal system: Abdomen is nondistended, soft and nontender. No organomegaly or masses felt. Normal bowel sounds heard. Central nervous system: Alert and oriented. No focal neurological deficits. Extremities: Warm, no deformities Skin: No rashes, lesions or ulcers, on limited exam Psychiatry: Mood & affect appropriate.     Data Reviewed: I have personally reviewed following labs and imaging studies  CBC: Recent Labs  Lab 08/01/17 0400 08/02/17 0337  WBC 22.8* 13.6*  NEUTROABS 19.2* 12.6*  HGB 12.1 10.8*  HCT 37.8 33.3*  MCV 97.2 97.9  PLT 434* 441*   Basic Metabolic Panel: Recent Labs  Lab 08/01/17 0400 08/02/17 0337  NA 139 137  K 3.4* 4.2  CL 104 103  CO2 19* 22  GLUCOSE 164* 317*  BUN 15  16  CREATININE 1.01* 1.18*  CALCIUM 8.6* 8.4*   GFR: Estimated Creatinine Clearance: 53.8 mL/min (A) (by C-G formula based on SCr of 1.18 mg/dL (H)). Liver Function Tests: Recent Labs  Lab 08/01/17 0400  AST 45*  ALT 36  ALKPHOS 72  BILITOT 1.4*  PROT 6.5  ALBUMIN 3.2*   No results for input(s): LIPASE, AMYLASE in the last 168 hours. No results for input(s): AMMONIA in the last 168 hours. Coagulation Profile: No results for input(s):  INR, PROTIME in the last 168 hours. Cardiac Enzymes: No results for input(s): CKTOTAL, CKMB, CKMBINDEX, TROPONINI in the last 168 hours. BNP (last 3 results) No results for input(s): PROBNP in the last 8760 hours. HbA1C: No results for input(s): HGBA1C in the last 72 hours. CBG: Recent Labs  Lab 08/01/17 2156 08/02/17 0202 08/02/17 0612 08/02/17 1118 08/02/17 1543  GLUCAP 470* 373* 249* 402* 291*   Lipid Profile: No results for input(s): CHOL, HDL, LDLCALC, TRIG, CHOLHDL, LDLDIRECT in the last 72 hours. Thyroid Function Tests: No results for input(s): TSH, T4TOTAL, FREET4, T3FREE, THYROIDAB in the last 72 hours. Anemia Panel: No results for input(s): VITAMINB12, FOLATE, FERRITIN, TIBC, IRON, RETICCTPCT in the last 72 hours. Sepsis Labs: Recent Labs  Lab 08/01/17 0704  LATICACIDVEN 1.5    Recent Results (from the past 240 hour(s))  Culture, blood (Routine X 2) w Reflex to ID Panel     Status: None (Preliminary result)   Collection Time: 08/01/17  2:30 PM  Result Value Ref Range Status   Specimen Description BLOOD RIGHT ARM  Final   Special Requests IN PEDIATRIC BOTTLE Blood Culture adequate volume  Final   Culture NO GROWTH < 24 HOURS  Final   Report Status PENDING  Incomplete  Culture, blood (Routine X 2) w Reflex to ID Panel     Status: None (Preliminary result)   Collection Time: 08/01/17  2:40 PM  Result Value Ref Range Status   Specimen Description BLOOD RIGHT HAND  Final   Special Requests IN PEDIATRIC BOTTLE Blood Culture adequate volume  Final   Culture NO GROWTH < 24 HOURS  Final   Report Status PENDING  Incomplete  MRSA PCR Screening     Status: None   Collection Time: 08/01/17  3:37 PM  Result Value Ref Range Status   MRSA by PCR NEGATIVE NEGATIVE Final    Comment:        The GeneXpert MRSA Assay (FDA approved for NASAL specimens only), is one component of a comprehensive MRSA colonization surveillance program. It is not intended to diagnose  MRSA infection nor to guide or monitor treatment for MRSA infections.          Radiology Studies: Ct Angio Chest Pe W Or Wo Contrast  Result Date: 08/01/2017 CLINICAL DATA:  62 year old female with acute shortness of breath today. EXAM: CT ANGIOGRAPHY CHEST WITH CONTRAST TECHNIQUE: Multidetector CT imaging of the chest was performed using the standard protocol during bolus administration of intravenous contrast. Multiplanar CT image reconstructions and MIPs were obtained to evaluate the vascular anatomy. CONTRAST:  ISOVUE-370 IOPAMIDOL (ISOVUE-370) INJECTION 76% COMPARISON:  08/01/2017 chest radiograph, 12/19/2016 chest CT and prior studies FINDINGS: Cardiovascular: This is a technically satisfactory study for evaluation of pulmonary emboli. No pulmonary emboli are identified. Cardiomegaly and coronary artery atherosclerotic calcifications again noted. Thoracic aortic atherosclerotic calcifications and plaque noted without aneurysm. No pericardial effusion. Mediastinum/Nodes: Mediastinal and bilateral hilar lymph nodes are unchanged given technique. A 8 mm right peritracheal lymph node (  image 34, series 5) and a 1.5 cm right hilar node (image 52, series 5) are again identified. No mediastinal mass or other mediastinal abnormality. Lungs/Pleura: Mild central ground-glass opacities and increased peripheral and tears DISH stool opacities may represent edema. Bilateral lower lobe ground-glass opacities are identified as well as right lower lobe airspace disease/consolidation. Septal/ peripheral interstitial prominence within the mid and lower lungs are again noted. No pleural effusion or pneumothorax. No discrete mass identified. Upper Abdomen: No acute abnormality. Musculoskeletal: No acute abnormality or suspicious bony lesion. Cervical spine fusion hardware again noted. Review of the MIP images confirms the above findings. IMPRESSION: 1. No evidence of pulmonary emboli or thoracic aortic aneurysm.  2. Central ground-glass opacities and increased interstitial opacities bilaterally, nonspecific but may represent edema. 3. More focal right lower lobe airspace disease/consolidation which may represent pneumonia. 4. Findings suspicious for interstitial lung disease again noted. 5. Cardiomegaly and coronary artery disease 6. Aortic Atherosclerosis (ICD10-I70.0) and Emphysema (ICD10-J43.9). Electronically Signed   By: Harmon Pier M.D.   On: 08/01/2017 08:14   Dg Chest Port 1 View  Result Date: 08/01/2017 CLINICAL DATA:  62 y/o F; shortness of breath. History of COPD and congestive heart failure. EXAM: PORTABLE CHEST 1 VIEW COMPARISON:  07/15/2017 chest radiograph FINDINGS: Stable normal cardiac silhouette given projection and technique. Left PICC line tip projects over upper SVC. Aortic atherosclerosis with calcification. COPD. Coarse reticular markings in the lung bases. No consolidation. No pleural effusion or pneumothorax. No acute osseous abnormality is evident. Anterior cervical discectomy and fusion hardware. IMPRESSION: Increase coarse reticular markings in lung bases may represent progression of suspected interstitial lung disease on prior CT. Possible infiltrate in left lung base. Electronically Signed   By: Mitzi Hansen M.D.   On: 08/01/2017 04:14    Scheduled Meds: . amLODipine  10 mg Oral Daily  . aspirin EC  81 mg Oral Daily  . atorvastatin  80 mg Oral QPM  . azaTHIOprine  25 mg Oral Daily  . furosemide  80 mg Oral Daily  . insulin aspart  0-20 Units Subcutaneous TID WC  . insulin aspart  20 Units Subcutaneous TID WC  . insulin glargine  25 Units Subcutaneous q morning - 10a  . ipratropium-albuterol  3 mL Nebulization TID  . liraglutide  1.2 mg Subcutaneous Daily  . mouth rinse  15 mL Mouth Rinse BID  . metoCLOPramide  5 mg Oral TID AC  . metoprolol tartrate  25 mg Oral BID  . potassium chloride SA  20 mEq Oral Daily  . [START ON 08/03/2017] predniSONE  20 mg Oral Q  breakfast  . sodium chloride flush  3 mL Intravenous Q12H  . tamsulosin  0.4 mg Oral QPC supper   Continuous Infusions: . sodium chloride 250 mL (08/01/17 1400)  . doxycycline (VIBRAMYCIN) IV 100 mg (08/02/17 1544)     LOS: 1 day    Time spent: > 35 minutes  Penny Pia, MD [facility] Pager [phone]  If 7PM-7AM, please contact night-coverage [email or web address] Password [email or web address] 08/02/2017, 4:01 PM

## 2017-08-02 NOTE — Progress Notes (Addendum)
Inpatient Diabetes Program Recommendations  AACE/ADA: New Consensus Statement on Inpatient Glycemic Control (2015)  Target Ranges:  Prepandial:   less than 140 mg/dL      Peak postprandial:   less than 180 mg/dL (1-2 hours)      Critically ill patients:  140 - 180 mg/dL   Lab Results  Component Value Date   GLUCAP 249 (H) 08/02/2017    Review of Glycemic Control Results for Elizabeth Montes, Elizabeth Montes (MRN [id number]) as of 08/02/2017 10:28  Ref. Range 08/01/2017 13:54 08/01/2017 14:57 08/01/2017 21:56 08/02/2017 02:02 08/02/2017 06:12  Glucose-Capillary Latest Ref Range: 65 - 99 mg/dL 937 (H) 902 (H) 409 (H) 373 (H) 249 (H)   Diabetes history: DM2 Outpatient Diabetes medications: Toujeo 25 units qd + Novolog 20 units tid meal coverage Current orders for Inpatient glycemic control: Lantus 25 (to start this am) + Novolog 20 units tid meal coverage +Novolog correction 0-20 units tid  Inpatient Diabetes Program Recommendations:   Noted A1c 10.7 on 07/05/17 in care everywhere. Patient's insulin was recently updated to Toujeo 30 units + Novolog 25 units tid  -Increase Lantus to 30 -Increase Novolog 25 units tid -Add Novolog 0-5 units hs correction  Thank you, Darel Hong E. Aryaa Bunting, RN, MSN, CDE  Diabetes Coordinator Inpatient Glycemic Control Team Team Pager [phone] (8am-5pm) 08/02/2017 10:37 AM

## 2017-08-02 NOTE — Evaluation (Signed)
Clinical/Bedside Swallow Evaluation Patient Details  Name: Elizabeth Montes MRN: [id number] Date of Birth: 1956/01/08  Today's Date: 08/02/2017 Time: SLP Start Time (ACUTE ONLY): 1329 SLP Stop Time (ACUTE ONLY): 1345 SLP Time Calculation (min) (ACUTE ONLY): 16 min  Past Medical History:  Past Medical History:  Diagnosis Date  . CHF (congestive heart failure) (HCC)   . COPD (chronic obstructive pulmonary disease) (HCC)   . Diabetes mellitus without complication (HCC)   . Heart attack (HCC)   . Hypertension    Past Surgical History:  Past Surgical History:  Procedure Laterality Date  . ABDOMINAL SURGERY    . TRACHEOSTOMY     HPI:  Pt is a 62 y.o.femalewho presents after a reported choking incident with subsequent coughing and vomiting. She then developed shortness of breath and respiratory distress requiring BiPAP upon admission. CT Chest is concerning for RLL PNA. PMH includes COPD, CHF, DM, HTN and reflux.   Assessment / Plan / Recommendation Clinical Impression  Pt appears to have s/s of esophageal issues, including: self-report of reflux PTA, description of hotdog getting "stuck" in her chest, and then regurgitation of the hot dog. She admits that she stopped taking her reflux medications several days before admission. Today, she has intermittent, delayed throat clearing with solid foods, felt to be more consistent with suspected esophageal component as her oropharyngeal swallow seems to occur swiftly and without other overt signs of dysphagia. Recommend to continue current diet, but reviewed esophageal and aspiration precautions. Will f/u briefly for tolerance and reinforcement of precautions. SLP Visit Diagnosis: Dysphagia, unspecified (R13.10)    Aspiration Risk  Mild aspiration risk    Diet Recommendation Regular;Thin liquid   Liquid Administration via: Cup;Straw Medication Administration: Whole meds with liquid Supervision: Patient able to self feed;Intermittent supervision  to cue for compensatory strategies Compensations: Small sips/bites;Slow rate;Follow solids with liquid Postural Changes: Seated upright at 90 degrees;Remain upright for at least 30 minutes after po intake    Other  Recommendations Recommended Consults: Consider esophageal assessment(should symptoms persist) Oral Care Recommendations: Oral care BID   Follow up Recommendations None      Frequency and Duration min 1 x/week  1 week       Prognosis        Swallow Study   General HPI: Pt is a 62 y.o.femalewho presents after a reported choking incident with subsequent coughing and vomiting. She then developed shortness of breath and respiratory distress requiring BiPAP upon admission. CT Chest is concerning for RLL PNA. PMH includes COPD, CHF, DM, HTN and reflux. Type of Study: Bedside Swallow Evaluation Previous Swallow Assessment: none in chart Diet Prior to this Study: Regular;Thin liquids Temperature Spikes Noted: No Respiratory Status: Nasal cannula History of Recent Intubation: No Behavior/Cognition: Alert;Cooperative;Pleasant mood Oral Care Completed by SLP: No Oral Cavity - Dentition: Edentulous Vision: Functional for self-feeding Self-Feeding Abilities: Able to feed self Patient Positioning: Upright in bed Baseline Vocal Quality: Normal Volitional Cough: Strong    Oral/Motor/Sensory Function     Ice Chips Ice chips: Not tested   Thin Liquid Thin Liquid: Within functional limits Presentation: Self Fed;Straw    Nectar Thick Nectar Thick Liquid: Not tested   Honey Thick Honey Thick Liquid: Not tested   Puree Puree: Not tested   Solid   GO   Solid: Impaired Presentation: Self Fed Pharyngeal Phase Impairments: Throat Clearing - Delayed        Maxcine Ham 08/02/2017,1:58 PM   Maxcine Ham, M.A. CCC-SLP [phone]

## 2017-08-03 LAB — GLUCOSE, CAPILLARY
Glucose-Capillary: 159 mg/dL — ABNORMAL HIGH (ref 65–99)
Glucose-Capillary: 109 mg/dL — ABNORMAL HIGH (ref 65–99)

## 2017-08-03 MED ORDER — DOXYCYCLINE HYCLATE 100 MG PO TABS
100.0000 mg | ORAL_TABLET | Freq: Two times a day (BID) | ORAL | 0 refills | Status: DC
Start: 2017-08-03 — End: 2017-08-12

## 2017-08-03 MED ORDER — DOXYCYCLINE HYCLATE 100 MG PO TABS: 100.0000 mg | ORAL_TABLET | Freq: Two times a day (BID) | ORAL | Status: DC

## 2017-08-03 NOTE — Care Management Note (Signed)
Case Management Note [name] RN, BSN Unit 4E-Case Manager [phone]  Patient Details  Name: Elizabeth Montes MRN: [id number] Date of Birth: 09/30/1955  Subjective/Objective:    Pt admitted acute resp. Failure ?asp. pna                Action/Plan: PTA pt lived at home with daughter- recently moved here from [place] this past Sat. Per patient. (was here last month visiting but now has moved here)- per conversation with pt she has home 02 that she brought here from [place] and plans to have transferred. Pt needs a PCP and has been given [facility] # to assist in finding a primary care here locally.  Confirmed with pt that she has [facility].   Expected Discharge Date:                  Expected Discharge Plan:  Home/Self Care  In-House Referral:  NA  Discharge planning Services  CM Consult  Post Acute Care Choice:  NA Choice offered to:     DME Arranged:    DME Agency:     HH Arranged:    HH Agency:     Status of Service:  In process, will continue to follow  If discussed at Long Length of Stay Meetings, dates discussed:    Discharge Disposition:   Additional Comments:  Darrold Span, RN 08/03/2017, 12:03 PM

## 2017-08-03 NOTE — Discharge Summary (Signed)
Physician Discharge Summary  Elizabeth Montes ZOX:[id number] DOB: 02-06-1956 DOA: 08/01/2017  PCP: Patient, No Pcp Per  Admit date: 08/01/2017 Discharge date: 08/03/2017  Time spent: > 35 minutes  Recommendations for Outpatient Follow-up:  1. Monitor creatinine   Discharge Diagnoses:  Principal Problem:   Acute respiratory failure with hypoxia (HCC) Active Problems:   CHF, acute on chronic (HCC)   COPD (chronic obstructive pulmonary disease) (HCC)   Healthcare-associated pneumonia   Diabetes mellitus (HCC)   Discharge Condition: stable  Diet recommendation: Heart healthy  Filed Weights   08/01/17 1530 08/02/17 0208 08/03/17 0349  Weight: 87.9 kg (193 lb 12.6 oz) 87.4 kg (192 lb 10.9 oz) 87.5 kg (192 lb 14.4 oz)    History of present illness:   62 y.o. 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   Pt treated for aspiration pna  Hospital Course:   Principal Problem:   Acute respiratory failure with hypoxia (HCC) - Patient reporting improvement in respiratory condition - doing well on home steroid regimen and doxycycline - Most likely secondary to aspiration event  Active Problems:   CHF, acute on chronic (HCC) - Was given IV Lasix in the ED. Patient does not appear fluid overloaded. - Continue prior to admission home medication regimen on d/c    COPD (chronic obstructive pulmonary disease) (HCC) - Does not appear to be COPD exacerbation on exam. Will place back on home oral steroids - Continue maintenance medication    Healthcare-associated pneumonia - I suspect acute respiratory failure secondary to hypoxia most likely secondary to aspiration event. resolved    Diabetes mellitus (HCC) - Continue prior to admission medication  regimen  Procedures:  None  Consultations:  None  Discharge Exam: Vitals:   08/03/17 0349 08/03/17 1240  BP: 126/61 120/61  Pulse: 85 100  Resp: (!) 21   Temp: 98.1 F (36.7 C) 97.9 F (36.6 C)  SpO2: 97% 98%    General: Pt in nad, alert and awake Cardiovascular: rrr, no rubs Respiratory: no increased wob, no wheezes, on supplemental oxygen (she is on this at home)  Discharge Instructions   Discharge Instructions    Call MD for:  severe uncontrolled pain   Complete by:  As directed    Call MD for:  temperature >100.4   Complete by:  As directed    Diet - low sodium heart healthy   Complete by:  As directed    Discharge instructions   Complete by:  As directed    Please ensure that you follow up with your primary care physician. For further evaluation and recommendations.   Increase activity slowly   Complete by:  As directed      Allergies as of 08/03/2017      Reactions   Lovenox [enoxaparin Sodium] Other (See Comments)   "made me bleed"      Medication List    TAKE these medications   amLODipine 10 MG tablet Commonly known as:  NORVASC Take 10 mg by mouth daily.   aspirin EC 81 MG tablet Take 81 mg by mouth daily.   atorvastatin 80 MG tablet Commonly known as:  LIPITOR Take 80 mg by mouth daily.   azaTHIOprine 50 MG tablet Commonly known as:  IMURAN Take 25 mg by mouth daily.   bisacodyl 5  MG EC tablet Commonly known as:  DULCOLAX Take 5 mg by mouth daily as needed for mild constipation.   cyclobenzaprine 5 MG tablet Commonly known as:  FLEXERIL Take 5 mg by mouth 3 (three) times daily as needed for muscle spasms.   doxycycline 100 MG tablet Commonly known as:  VIBRA-TABS Take 1 tablet (100 mg total) by mouth every 12 (twelve) hours.   fenofibrate 160 MG tablet Take 160 mg by mouth daily.   furosemide 80 MG tablet Commonly known as:  LASIX Take 80 mg by mouth daily.   liraglutide 18 MG/3ML Sopn Commonly known as:  VICTOZA Inject  1.2 mg into the skin daily.   metoCLOPramide 10 MG tablet Commonly known as:  REGLAN Take 5 mg by mouth 3 (three) times daily before meals.   metoprolol tartrate 25 MG tablet Commonly known as:  LOPRESSOR Take 25 mg by mouth 2 (two) times daily.   NOVOLOG FLEXPEN 100 UNIT/ML FlexPen Generic drug:  insulin aspart Inject 20 Units into the skin 3 (three) times daily with meals.   oxyCODONE-acetaminophen 5-325 MG tablet Commonly known as:  PERCOCET/ROXICET Take 1 tablet by mouth every 4 (four) hours as needed for severe pain.   potassium chloride SA 20 MEQ tablet Commonly known as:  K-DUR,KLOR-CON Take 20 mEq by mouth daily.   predniSONE 20 MG tablet Commonly known as:  DELTASONE Take 20 mg by mouth daily with breakfast.   SPIRIVA HANDIHALER 18 MCG inhalation capsule Generic drug:  tiotropium Place 18 mcg into inhaler and inhale daily as needed (for wheezing).   SYMBICORT 160-4.5 MCG/ACT inhaler Generic drug:  budesonide-formoterol Inhale 2 puffs into the lungs 2 (two) times daily.   tamsulosin 0.4 MG Caps capsule Commonly known as:  FLOMAX Take 0.4 mg by mouth daily after supper.   TOUJEO SOLOSTAR 300 UNIT/ML Sopn Generic drug:  Insulin Glargine Inject 25 Units into the skin every morning.      Allergies  Allergen Reactions  . Lovenox [Enoxaparin Sodium] Other (See Comments)    "made me bleed"   Follow-up Information    [facility] Follow up.   Why:  For physician referral assistance- call for list of providers accepting new patients Contact information: Please call [phone]           The results of significant diagnostics from this hospitalization (including imaging, microbiology, ancillary and laboratory) are listed below for reference.    Significant Diagnostic Studies: Dg Chest 2 View  Result Date: 07/15/2017 CLINICAL DATA:  Shortness of breath and cough EXAM: CHEST  2 VIEW COMPARISON:  CTA chest 12/19/2016 FINDINGS: Diffuse interstitial  prominence. No pleural effusion or pneumothorax. Bibasilar atelectasis. Normal cardiomediastinal contours. The tip of the left PICC line is at the cavoatrial junction. IMPRESSION: Chronic findings of COPD without focal airspace consolidation. Electronically Signed   By: Deatra Robinson M.D.   On: 07/15/2017 23:48   Ct Angio Chest Pe W Or Wo Contrast  Result Date: 08/01/2017 CLINICAL DATA:  62 year old female with acute shortness of breath today. EXAM: CT ANGIOGRAPHY CHEST WITH CONTRAST TECHNIQUE: Multidetector CT imaging of the chest was performed using the standard protocol during bolus administration of intravenous contrast. Multiplanar CT image reconstructions and MIPs were obtained to evaluate the vascular anatomy. CONTRAST:  ISOVUE-370 IOPAMIDOL (ISOVUE-370) INJECTION 76% COMPARISON:  08/01/2017 chest radiograph, 12/19/2016 chest CT and prior studies FINDINGS: Cardiovascular: This is a technically satisfactory study for evaluation of pulmonary emboli. No pulmonary emboli are identified. Cardiomegaly and coronary artery atherosclerotic  calcifications again noted. Thoracic aortic atherosclerotic calcifications and plaque noted without aneurysm. No pericardial effusion. Mediastinum/Nodes: Mediastinal and bilateral hilar lymph nodes are unchanged given technique. A 8 mm right peritracheal lymph node (image 34, series 5) and a 1.5 cm right hilar node (image 52, series 5) are again identified. No mediastinal mass or other mediastinal abnormality. Lungs/Pleura: Mild central ground-glass opacities and increased peripheral and tears DISH stool opacities may represent edema. Bilateral lower lobe ground-glass opacities are identified as well as right lower lobe airspace disease/consolidation. Septal/ peripheral interstitial prominence within the mid and lower lungs are again noted. No pleural effusion or pneumothorax. No discrete mass identified. Upper Abdomen: No acute abnormality. Musculoskeletal: No acute  abnormality or suspicious bony lesion. Cervical spine fusion hardware again noted. Review of the MIP images confirms the above findings. IMPRESSION: 1. No evidence of pulmonary emboli or thoracic aortic aneurysm. 2. Central ground-glass opacities and increased interstitial opacities bilaterally, nonspecific but may represent edema. 3. More focal right lower lobe airspace disease/consolidation which may represent pneumonia. 4. Findings suspicious for interstitial lung disease again noted. 5. Cardiomegaly and coronary artery disease 6. Aortic Atherosclerosis (ICD10-I70.0) and Emphysema (ICD10-J43.9). Electronically Signed   By: Harmon Pier M.D.   On: 08/01/2017 08:14   Dg Chest Port 1 View  Result Date: 08/01/2017 CLINICAL DATA:  62 y/o F; shortness of breath. History of COPD and congestive heart failure. EXAM: PORTABLE CHEST 1 VIEW COMPARISON:  07/15/2017 chest radiograph FINDINGS: Stable normal cardiac silhouette given projection and technique. Left PICC line tip projects over upper SVC. Aortic atherosclerosis with calcification. COPD. Coarse reticular markings in the lung bases. No consolidation. No pleural effusion or pneumothorax. No acute osseous abnormality is evident. Anterior cervical discectomy and fusion hardware. IMPRESSION: Increase coarse reticular markings in lung bases may represent progression of suspected interstitial lung disease on prior CT. Possible infiltrate in left lung base. Electronically Signed   By: Mitzi Hansen M.D.   On: 08/01/2017 04:14    Microbiology: Recent Results (from the past 240 hour(s))  Culture, blood (Routine X 2) w Reflex to ID Panel     Status: None (Preliminary result)   Collection Time: 08/01/17  2:30 PM  Result Value Ref Range Status   Specimen Description BLOOD RIGHT ARM  Final   Special Requests IN PEDIATRIC BOTTLE Blood Culture adequate volume  Final   Culture NO GROWTH 2 DAYS  Final   Report Status PENDING  Incomplete  Culture, blood  (Routine X 2) w Reflex to ID Panel     Status: None (Preliminary result)   Collection Time: 08/01/17  2:40 PM  Result Value Ref Range Status   Specimen Description BLOOD RIGHT HAND  Final   Special Requests IN PEDIATRIC BOTTLE Blood Culture adequate volume  Final   Culture NO GROWTH 2 DAYS  Final   Report Status PENDING  Incomplete  MRSA PCR Screening     Status: None   Collection Time: 08/01/17  3:37 PM  Result Value Ref Range Status   MRSA by PCR NEGATIVE NEGATIVE Final    Comment:        The GeneXpert MRSA Assay (FDA approved for NASAL specimens only), is one component of a comprehensive MRSA colonization surveillance program. It is not intended to diagnose MRSA infection nor to guide or monitor treatment for MRSA infections.      Labs: Basic Metabolic Panel: Recent Labs  Lab 08/01/17 0400 08/02/17 0337  NA 139 137  K 3.4* 4.2  CL 104 103  CO2 19* 22  GLUCOSE 164* 317*  BUN 15 16  CREATININE 1.01* 1.18*  CALCIUM 8.6* 8.4*   Liver Function Tests: Recent Labs  Lab 08/01/17 0400  AST 45*  ALT 36  ALKPHOS 72  BILITOT 1.4*  PROT 6.5  ALBUMIN 3.2*   No results for input(s): LIPASE, AMYLASE in the last 168 hours. No results for input(s): AMMONIA in the last 168 hours. CBC: Recent Labs  Lab 08/01/17 0400 08/02/17 0337  WBC 22.8* 13.6*  NEUTROABS 19.2* 12.6*  HGB 12.1 10.8*  HCT 37.8 33.3*  MCV 97.2 97.9  PLT 434* 441*   Cardiac Enzymes: No results for input(s): CKTOTAL, CKMB, CKMBINDEX, TROPONINI in the last 168 hours. BNP: BNP (last 3 results) Recent Labs    12/19/16 1421 07/16/17 0020 08/01/17 0614  BNP 65.0 138.1* 232.6*    ProBNP (last 3 results) No results for input(s): PROBNP in the last 8760 hours.  CBG: Recent Labs  Lab 08/02/17 1543 08/02/17 1638 08/02/17 2101 08/03/17 0556 08/03/17 1137  GLUCAP 291* 235* 252* 159* 109*    Signed:  Penny Pia MD.  [facility] 08/03/2017, 1:38 PM

## 2017-08-03 NOTE — Progress Notes (Signed)
Pt is refusing all respiratory treats. She states they make her cough and her throat is sore.  RT changed treatments to PRN

## 2017-08-06 LAB — CULTURE, BLOOD (ROUTINE X 2)
CULTURE: NO GROWTH
Culture: NO GROWTH
SPECIAL REQUESTS: ADEQUATE
Special Requests: ADEQUATE

## 2017-08-10 ENCOUNTER — Encounter (HOSPITAL_COMMUNITY): Payer: Self-pay

## 2017-08-10 DIAGNOSIS — Z79899 Other long term (current) drug therapy: Secondary | ICD-10-CM | POA: Insufficient documentation

## 2017-08-10 DIAGNOSIS — I5032 Chronic diastolic (congestive) heart failure: Secondary | ICD-10-CM | POA: Diagnosis not present

## 2017-08-10 DIAGNOSIS — Z7952 Long term (current) use of systemic steroids: Secondary | ICD-10-CM | POA: Diagnosis not present

## 2017-08-10 DIAGNOSIS — I110 Hypertensive heart disease with heart failure: Secondary | ICD-10-CM | POA: Insufficient documentation

## 2017-08-10 DIAGNOSIS — J9621 Acute and chronic respiratory failure with hypoxia: Secondary | ICD-10-CM | POA: Insufficient documentation

## 2017-08-10 DIAGNOSIS — I2510 Atherosclerotic heart disease of native coronary artery without angina pectoris: Secondary | ICD-10-CM | POA: Diagnosis not present

## 2017-08-10 DIAGNOSIS — E119 Type 2 diabetes mellitus without complications: Secondary | ICD-10-CM | POA: Diagnosis not present

## 2017-08-10 DIAGNOSIS — Z888 Allergy status to other drugs, medicaments and biological substances status: Secondary | ICD-10-CM | POA: Insufficient documentation

## 2017-08-10 DIAGNOSIS — Z9981 Dependence on supplemental oxygen: Secondary | ICD-10-CM | POA: Insufficient documentation

## 2017-08-10 DIAGNOSIS — Z955 Presence of coronary angioplasty implant and graft: Secondary | ICD-10-CM | POA: Diagnosis not present

## 2017-08-10 DIAGNOSIS — J8410 Pulmonary fibrosis, unspecified: Secondary | ICD-10-CM | POA: Diagnosis not present

## 2017-08-10 DIAGNOSIS — Z7982 Long term (current) use of aspirin: Secondary | ICD-10-CM | POA: Diagnosis not present

## 2017-08-10 DIAGNOSIS — J441 Chronic obstructive pulmonary disease with (acute) exacerbation: Secondary | ICD-10-CM | POA: Diagnosis not present

## 2017-08-10 DIAGNOSIS — M3320 Polymyositis, organ involvement unspecified: Secondary | ICD-10-CM | POA: Insufficient documentation

## 2017-08-10 NOTE — ED Triage Notes (Signed)
Pt was recently released from the hospital with pneumonia She completed her antibiotics and now her coughh id back, she's short of breath and her O2sats are low

## 2017-08-11 ENCOUNTER — Observation Stay (HOSPITAL_COMMUNITY)
Admission: EM | Admit: 2017-08-11 | Discharge: 2017-08-12 | Disposition: A | Discharge status: Home / Self Care | Payer: Medicare HMO | Attending: Internal Medicine | Admitting: Internal Medicine

## 2017-08-11 ENCOUNTER — Encounter (HOSPITAL_COMMUNITY): Payer: Self-pay

## 2017-08-11 ENCOUNTER — Emergency Department (HOSPITAL_COMMUNITY): Payer: Medicare HMO

## 2017-08-11 ENCOUNTER — Other Ambulatory Visit: Payer: Self-pay

## 2017-08-11 DIAGNOSIS — I10 Essential (primary) hypertension: Secondary | ICD-10-CM | POA: Diagnosis present

## 2017-08-11 DIAGNOSIS — E119 Type 2 diabetes mellitus without complications: Secondary | ICD-10-CM

## 2017-08-11 DIAGNOSIS — J441 Chronic obstructive pulmonary disease with (acute) exacerbation: Secondary | ICD-10-CM | POA: Diagnosis not present

## 2017-08-11 DIAGNOSIS — E1100 Type 2 diabetes mellitus with hyperosmolarity without nonketotic hyperglycemic-hyperosmolar coma (NKHHC): Secondary | ICD-10-CM

## 2017-08-11 LAB — CBC WITH DIFFERENTIAL/PLATELET
Basophils Absolute: 0 10*3/uL (ref 0.0–0.1)
Basophils Relative: 0 %
EOS ABS: 0.2 10*3/uL (ref 0.0–0.7)
Eosinophils Relative: 1 %
HCT: 32.9 % — ABNORMAL LOW (ref 36.0–46.0)
HEMOGLOBIN: 10.6 g/dL — AB (ref 12.0–15.0)
LYMPHS ABS: 2.9 10*3/uL (ref 0.7–4.0)
LYMPHS PCT: 18 %
MCH: 30.8 pg (ref 26.0–34.0)
MCHC: 32.2 g/dL (ref 30.0–36.0)
MCV: 95.6 fL (ref 78.0–100.0)
MONOS PCT: 10 %
Monocytes Absolute: 1.6 10*3/uL — ABNORMAL HIGH (ref 0.1–1.0)
NEUTROS PCT: 71 %
Neutro Abs: 11.3 10*3/uL — ABNORMAL HIGH (ref 1.7–7.7)
Platelets: 454 10*3/uL — ABNORMAL HIGH (ref 150–400)
RBC: 3.44 MIL/uL — AB (ref 3.87–5.11)
RDW: 17.4 % — ABNORMAL HIGH (ref 11.5–15.5)
WBC: 16.1 10*3/uL — AB (ref 4.0–10.5)

## 2017-08-11 LAB — BASIC METABOLIC PANEL
Anion gap: 7 (ref 5–15)
BUN: 13 mg/dL (ref 6–20)
CHLORIDE: 111 mmol/L (ref 101–111)
CO2: 23 mmol/L (ref 22–32)
CREATININE: 0.76 mg/dL (ref 0.44–1.00)
Calcium: 8.8 mg/dL — ABNORMAL LOW (ref 8.9–10.3)
GFR calc non Af Amer: >60 mL/min (ref >60)
Glucose, Bld: 104 mg/dL — ABNORMAL HIGH (ref 65–99)
POTASSIUM: 3 mmol/L — AB (ref 3.5–5.1)
Sodium: 141 mmol/L (ref 135–145)

## 2017-08-11 LAB — I-STAT TROPONIN, ED: Troponin i, poc: 0.02 ng/mL (ref 0.00–0.08)

## 2017-08-11 LAB — CBG MONITORING, ED
GLUCOSE-CAPILLARY: 382 mg/dL — AB (ref 65–99)
Glucose-Capillary: 393 mg/dL — ABNORMAL HIGH (ref 65–99)
Glucose-Capillary: 374 mg/dL — ABNORMAL HIGH (ref 65–99)

## 2017-08-11 LAB — BRAIN NATRIURETIC PEPTIDE: B NATRIURETIC PEPTIDE 5: 82.5 pg/mL (ref 0.0–100.0)

## 2017-08-11 LAB — GLUCOSE, CAPILLARY: Glucose-Capillary: 161 mg/dL — ABNORMAL HIGH (ref 65–99)

## 2017-08-11 MED ORDER — ASPIRIN EC 81 MG PO TBEC
81.0000 mg | DELAYED_RELEASE_TABLET | Freq: Every day | ORAL | Status: DC
  Administered 2017-08-11 – 2017-08-12 (×2): 81 mg via ORAL
  Filled 2017-08-11 (×2): qty 1

## 2017-08-11 MED ORDER — ALBUTEROL SULFATE (2.5 MG/3ML) 0.083% IN NEBU: 2.5000 mg | INHALATION_SOLUTION | RESPIRATORY_TRACT | Status: DC | PRN

## 2017-08-11 MED ORDER — AMLODIPINE BESYLATE 10 MG PO TABS
10.0000 mg | ORAL_TABLET | Freq: Every day | ORAL | Status: DC
  Administered 2017-08-11 – 2017-08-12 (×2): 10 mg via ORAL
  Filled 2017-08-11: qty 1
  Filled 2017-08-11: qty 2

## 2017-08-11 MED ORDER — TIOTROPIUM BROMIDE MONOHYDRATE 18 MCG IN CAPS: 18.0000 ug | ORAL_CAPSULE | Freq: Every day | RESPIRATORY_TRACT | Status: DC | PRN

## 2017-08-11 MED ORDER — TAMSULOSIN HCL 0.4 MG PO CAPS
0.4000 mg | ORAL_CAPSULE | Freq: Every day | ORAL | Status: DC
  Administered 2017-08-11: 0.4 mg via ORAL
  Filled 2017-08-11: qty 1

## 2017-08-11 MED ORDER — AZATHIOPRINE 50 MG PO TABS
25.0000 mg | ORAL_TABLET | Freq: Every day | ORAL | Status: DC
  Administered 2017-08-11 – 2017-08-12 (×2): 25 mg via ORAL
  Filled 2017-08-11 (×2): qty 1

## 2017-08-11 MED ORDER — LIRAGLUTIDE 18 MG/3ML ~~LOC~~ SOPN
1.2000 mg | PEN_INJECTOR | Freq: Every day | SUBCUTANEOUS | Status: DC
  Administered 2017-08-11 – 2017-08-12 (×2): 1.2 mg via SUBCUTANEOUS

## 2017-08-11 MED ORDER — TIOTROPIUM BROMIDE MONOHYDRATE 18 MCG IN CAPS
18.0000 ug | ORAL_CAPSULE | Freq: Every day | RESPIRATORY_TRACT | Status: DC | PRN
  Filled 2017-08-11: qty 5

## 2017-08-11 MED ORDER — AZITHROMYCIN 500 MG IV SOLR
500.0000 mg | Freq: Once | INTRAVENOUS | Status: AC
  Administered 2017-08-11: 500 mg via INTRAVENOUS
  Filled 2017-08-11: qty 500

## 2017-08-11 MED ORDER — INSULIN GLARGINE 100 UNIT/ML ~~LOC~~ SOLN
25.0000 [IU] | Freq: Every day | SUBCUTANEOUS | Status: DC
  Administered 2017-08-11 – 2017-08-12 (×2): 25 [IU] via SUBCUTANEOUS
  Filled 2017-08-11 (×2): qty 0.25

## 2017-08-11 MED ORDER — METOCLOPRAMIDE HCL 5 MG PO TABS
5.0000 mg | ORAL_TABLET | Freq: Three times a day (TID) | ORAL | Status: DC
  Administered 2017-08-11 – 2017-08-12 (×4): 5 mg via ORAL
  Filled 2017-08-11 (×4): qty 1

## 2017-08-11 MED ORDER — MOMETASONE FURO-FORMOTEROL FUM 200-5 MCG/ACT IN AERO
2.0000 | INHALATION_SPRAY | Freq: Two times a day (BID) | RESPIRATORY_TRACT | Status: DC
  Administered 2017-08-12: 2 via RESPIRATORY_TRACT
  Filled 2017-08-11: qty 8.8

## 2017-08-11 MED ORDER — AZITHROMYCIN 250 MG PO TABS
250.0000 mg | ORAL_TABLET | Freq: Every day | ORAL | Status: DC
  Administered 2017-08-12: 250 mg via ORAL
  Filled 2017-08-11 (×2): qty 1

## 2017-08-11 MED ORDER — ATORVASTATIN CALCIUM 40 MG PO TABS
80.0000 mg | ORAL_TABLET | Freq: Every day | ORAL | Status: DC
  Administered 2017-08-11 – 2017-08-12 (×2): 80 mg via ORAL
  Filled 2017-08-11: qty 2
  Filled 2017-08-11: qty 1

## 2017-08-11 MED ORDER — IPRATROPIUM-ALBUTEROL 0.5-2.5 (3) MG/3ML IN SOLN
3.0000 mL | RESPIRATORY_TRACT | Status: DC | PRN
  Filled 2017-08-11: qty 3

## 2017-08-11 MED ORDER — BISACODYL 5 MG PO TBEC: 5.0000 mg | DELAYED_RELEASE_TABLET | Freq: Every day | ORAL | Status: DC | PRN

## 2017-08-11 MED ORDER — POTASSIUM CHLORIDE CRYS ER 20 MEQ PO TBCR
20.0000 meq | EXTENDED_RELEASE_TABLET | Freq: Every day | ORAL | Status: DC
  Administered 2017-08-11 – 2017-08-12 (×2): 20 meq via ORAL
  Filled 2017-08-11 (×2): qty 1

## 2017-08-11 MED ORDER — INSULIN ASPART 100 UNIT/ML ~~LOC~~ SOLN
20.0000 [IU] | Freq: Three times a day (TID) | SUBCUTANEOUS | Status: DC
  Administered 2017-08-11 – 2017-08-12 (×4): 20 [IU] via SUBCUTANEOUS
  Filled 2017-08-11 (×2): qty 1

## 2017-08-11 MED ORDER — SODIUM CHLORIDE 0.9 % IV SOLN
INTRAVENOUS | Status: DC
  Administered 2017-08-11 (×2): via INTRAVENOUS

## 2017-08-11 MED ORDER — CYCLOBENZAPRINE HCL 5 MG PO TABS: 5.0000 mg | ORAL_TABLET | Freq: Three times a day (TID) | ORAL | Status: DC | PRN

## 2017-08-11 MED ORDER — OXYCODONE-ACETAMINOPHEN 5-325 MG PO TABS: 1.0000 | ORAL_TABLET | ORAL | Status: DC | PRN

## 2017-08-11 MED ORDER — ACETAMINOPHEN 325 MG PO TABS: 650.0000 mg | ORAL_TABLET | Freq: Four times a day (QID) | ORAL | Status: DC | PRN

## 2017-08-11 MED ORDER — FUROSEMIDE 40 MG PO TABS
80.0000 mg | ORAL_TABLET | Freq: Every day | ORAL | Status: DC
  Administered 2017-08-11 – 2017-08-12 (×2): 80 mg via ORAL
  Filled 2017-08-11 (×2): qty 2

## 2017-08-11 MED ORDER — ONDANSETRON HCL 4 MG PO TABS: 4.0000 mg | ORAL_TABLET | Freq: Four times a day (QID) | ORAL | Status: DC | PRN

## 2017-08-11 MED ORDER — METHYLPREDNISOLONE SODIUM SUCC 40 MG IJ SOLR
40.0000 mg | Freq: Two times a day (BID) | INTRAMUSCULAR | Status: DC
  Administered 2017-08-11 – 2017-08-12 (×2): 40 mg via INTRAVENOUS
  Filled 2017-08-11 (×2): qty 1

## 2017-08-11 MED ORDER — IPRATROPIUM-ALBUTEROL 0.5-2.5 (3) MG/3ML IN SOLN
3.0000 mL | Freq: Three times a day (TID) | RESPIRATORY_TRACT | Status: DC
  Administered 2017-08-12: 3 mL via RESPIRATORY_TRACT
  Filled 2017-08-11: qty 3

## 2017-08-11 MED ORDER — METOPROLOL TARTRATE 25 MG PO TABS
25.0000 mg | ORAL_TABLET | Freq: Two times a day (BID) | ORAL | Status: DC
  Administered 2017-08-11 – 2017-08-12 (×3): 25 mg via ORAL
  Filled 2017-08-11 (×3): qty 1

## 2017-08-11 MED ORDER — SODIUM CHLORIDE 0.9% FLUSH
3.0000 mL | Freq: Two times a day (BID) | INTRAVENOUS | Status: DC
  Administered 2017-08-11 – 2017-08-12 (×3): 3 mL via INTRAVENOUS

## 2017-08-11 MED ORDER — IPRATROPIUM-ALBUTEROL 0.5-2.5 (3) MG/3ML IN SOLN
3.0000 mL | Freq: Four times a day (QID) | RESPIRATORY_TRACT | Status: DC
  Administered 2017-08-11 (×3): 3 mL via RESPIRATORY_TRACT
  Filled 2017-08-11 (×2): qty 3

## 2017-08-11 MED ORDER — PREDNISONE 20 MG PO TABS: 20.0000 mg | ORAL_TABLET | Freq: Every day | ORAL | Status: DC

## 2017-08-11 MED ORDER — SODIUM CHLORIDE 0.9% FLUSH: 3.0000 mL | INTRAVENOUS | Status: DC | PRN

## 2017-08-11 MED ORDER — FENOFIBRATE 160 MG PO TABS
160.0000 mg | ORAL_TABLET | Freq: Every day | ORAL | Status: DC
  Administered 2017-08-11 – 2017-08-12 (×2): 160 mg via ORAL
  Filled 2017-08-11 (×2): qty 1

## 2017-08-11 MED ORDER — SENNA 8.6 MG PO TABS
1.0000 | ORAL_TABLET | Freq: Two times a day (BID) | ORAL | Status: DC
  Administered 2017-08-11 – 2017-08-12 (×3): 8.6 mg via ORAL
  Filled 2017-08-11 (×3): qty 1

## 2017-08-11 MED ORDER — HEPARIN SODIUM (PORCINE) 5000 UNIT/ML IJ SOLN
5000.0000 [IU] | Freq: Three times a day (TID) | INTRAMUSCULAR | Status: DC
  Administered 2017-08-11 – 2017-08-12 (×3): 5000 [IU] via SUBCUTANEOUS
  Filled 2017-08-11 (×4): qty 1

## 2017-08-11 MED ORDER — ONDANSETRON HCL 4 MG/2ML IJ SOLN: 4.0000 mg | Freq: Four times a day (QID) | INTRAMUSCULAR | Status: DC | PRN

## 2017-08-11 MED ORDER — SODIUM CHLORIDE 0.9 % IV SOLN: 250.0000 mL | INTRAVENOUS | Status: DC | PRN

## 2017-08-11 MED ORDER — TRAZODONE HCL 50 MG PO TABS: 50.0000 mg | ORAL_TABLET | Freq: Every evening | ORAL | Status: DC | PRN

## 2017-08-11 MED ORDER — POLYETHYLENE GLYCOL 3350 17 G PO PACK: 17.0000 g | PACK | Freq: Every day | ORAL | Status: DC | PRN

## 2017-08-11 MED ORDER — IPRATROPIUM-ALBUTEROL 0.5-2.5 (3) MG/3ML IN SOLN
3.0000 mL | Freq: Once | RESPIRATORY_TRACT | Status: AC
  Administered 2017-08-11: 3 mL via RESPIRATORY_TRACT
  Filled 2017-08-11: qty 3

## 2017-08-11 MED ORDER — METHYLPREDNISOLONE SODIUM SUCC 125 MG IJ SOLR
125.0000 mg | Freq: Once | INTRAMUSCULAR | Status: AC
  Administered 2017-08-11: 125 mg via INTRAVENOUS
  Filled 2017-08-11: qty 2

## 2017-08-11 MED ORDER — GUAIFENESIN ER 600 MG PO TB12
600.0000 mg | ORAL_TABLET | Freq: Two times a day (BID) | ORAL | Status: DC
  Administered 2017-08-11 – 2017-08-12 (×3): 600 mg via ORAL
  Filled 2017-08-11 (×3): qty 1

## 2017-08-11 MED ORDER — INSULIN ASPART 100 UNIT/ML ~~LOC~~ SOLN
0.0000 [IU] | Freq: Three times a day (TID) | SUBCUTANEOUS | Status: DC
  Administered 2017-08-11 (×2): 9 [IU] via SUBCUTANEOUS
  Administered 2017-08-12: 2 [IU] via SUBCUTANEOUS
  Administered 2017-08-12: 3 [IU] via SUBCUTANEOUS
  Filled 2017-08-11 (×2): qty 1

## 2017-08-11 MED ORDER — ACETAMINOPHEN 650 MG RE SUPP: 650.0000 mg | Freq: Four times a day (QID) | RECTAL | Status: DC | PRN

## 2017-08-11 NOTE — Plan of Care (Addendum)
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 Just admitted into the hospital from 1/6-1/8 for acute respiratory failure with hypoxia thought possibly secondary to aspiration and COPD.  Patient was sent home on doxycycline and reported taking medications as prescribed without relief of symptoms.  WBC 16.6(trending upward), hemoglobin 10.6 (stable), BNP 82.5.  Chest x-ray showing poor inspiratory effort with signs of focal consolidation.  Patient was given 125 mg of Solu-Medrol and a DuoNeb treatment.  Admitted as observation to a telemetry bed for suspected COPD exacerbation with acute on chronic hypoxic respiratory.

## 2017-08-11 NOTE — ED Notes (Signed)
Patient gone to radiology  

## 2017-08-11 NOTE — Progress Notes (Signed)
Report received  by ongoing RN. Agreed with RN assessment of patient and will cont to monitor.

## 2017-08-11 NOTE — ED Notes (Signed)
ED TO INPATIENT HANDOFF REPORT  Name/Age/Gender Elizabeth Montes 62 y.o. female  Code Status    Code Status Orders  (From admission, onward)        Start     Ordered   08/11/17 1003  Full code  Continuous     08/11/17 1002    Code Status History    Date Active Date Inactive Code Status Order ID Comments User Context   08/01/2017 12:59 08/03/2017 19:21 Full Code [id number]  Vashti Hey, MD ED   07/16/2017 02:22 07/18/2017 19:07 Full Code [id number]  Vianne Bulls, MD ED    Advance Directive Documentation     Most Recent Value  Type of Advance Directive  Healthcare Power of Attorney  Pre-existing out of facility DNR order (yellow form or pink MOST form)  No data  "MOST" Form in Place?  No data      Home/SNF/Other Home  Chief Complaint shortness of breath; chest pain   Level of Care/Admitting Diagnosis ED Disposition    ED Disposition Condition [facility] Area: [place] [100102]  Level of Care: Telemetry [5]  Admit to tele based on following criteria: Other see comments  Comments: Hypoxia  Diagnosis: COPD with acute exacerbation [facility]) [[id number]]  Admitting Physician: Morrison Old  Attending Physician: Morrison Old  Bed request comments: Tele  PT Class (Do Not Modify): Observation [104]  PT Acc Code (Do Not Modify): Observation [10022]       Medical History Past Medical History:  Diagnosis Date  . CHF (congestive heart failure) ([place])   . COPD (chronic obstructive pulmonary disease) ([place])   . Diabetes mellitus without complication ([place])   . Heart attack ([place])   . Hypertension     Allergies Allergies  Allergen Reactions  . Lovenox [Enoxaparin Sodium] Other (See Comments)    "made me bleed"    IV Location/Drains/Wounds Patient Lines/Drains/Airways Status   Active Line/Drains/Airways    Name:   Placement date:   Placement time:   Site:   Days:   Implanted Port Other (Comment)   -     -    Other (Comment)      External Urinary Catheter   08/01/17    1552    -   10          Labs/Imaging Results for orders placed or performed during the hospital encounter of 08/11/17 (from the past 48 hour(s))  Basic metabolic panel     Status: Abnormal   Collection Time: 08/11/17  1:03 AM  Result Value Ref Range   Sodium 141 135 - 145 mmol/L   Potassium 3.0 (L) 3.5 - 5.1 mmol/L   Chloride 111 101 - 111 mmol/L   CO2 23 22 - 32 mmol/L   Glucose, Bld 104 (H) 65 - 99 mg/dL   BUN 13 6 - 20 mg/dL   Creatinine, Ser 0.76 0.44 - 1.00 mg/dL   Calcium 8.8 (L) 8.9 - 10.3 mg/dL   GFR calc non Af Amer >60 >60 mL/min   GFR calc Af Amer >60 >60 mL/min    Comment: (NOTE) The eGFR has been calculated using the CKD EPI equation. This calculation has not been validated in all clinical situations. eGFR's persistently <60 mL/min signify possible Chronic Kidney Disease.    Anion gap 7 5 - 15  CBC with Differential     Status: Abnormal   Collection Time: 08/11/17  1:03 AM  Result Value Ref Range  WBC 16.1 (H) 4.0 - 10.5 K/uL   RBC 3.44 (L) 3.87 - 5.11 MIL/uL   Hemoglobin 10.6 (L) 12.0 - 15.0 g/dL   HCT 32.9 (L) 36.0 - 46.0 %   MCV 95.6 78.0 - 100.0 fL   MCH 30.8 26.0 - 34.0 pg   MCHC 32.2 30.0 - 36.0 g/dL   RDW 17.4 (H) 11.5 - 15.5 %   Platelets 454 (H) 150 - 400 K/uL   Neutrophils Relative % 71 %   Neutro Abs 11.3 (H) 1.7 - 7.7 K/uL   Lymphocytes Relative 18 %   Lymphs Abs 2.9 0.7 - 4.0 K/uL   Monocytes Relative 10 %   Monocytes Absolute 1.6 (H) 0.1 - 1.0 K/uL   Eosinophils Relative 1 %   Eosinophils Absolute 0.2 0.0 - 0.7 K/uL   Basophils Relative 0 %   Basophils Absolute 0.0 0.0 - 0.1 K/uL  Brain natriuretic peptide     Status: None   Collection Time: 08/11/17  1:03 AM  Result Value Ref Range   B Natriuretic Peptide 82.5 0.0 - 100.0 pg/mL  I-stat troponin, ED     Status: None   Collection Time: 08/11/17  2:37 AM  Result Value Ref Range   Troponin i, poc 0.02 0.00 - 0.08 ng/mL    Comment 3            Comment: Due to the release kinetics of cTnI, a negative result within the first hours of the onset of symptoms does not rule out myocardial infarction with certainty. If myocardial infarction is still suspected, repeat the test at appropriate intervals.   CBG monitoring, ED     Status: Abnormal   Collection Time: 08/11/17  1:05 PM  Result Value Ref Range   Glucose-Capillary 393 (H) 65 - 99 mg/dL  CBG monitoring, ED     Status: Abnormal   Collection Time: 08/11/17  2:36 PM  Result Value Ref Range   Glucose-Capillary 382 (H) 65 - 99 mg/dL  CBG monitoring, ED     Status: Abnormal   Collection Time: 08/11/17  4:39 PM  Result Value Ref Range   Glucose-Capillary 374 (H) 65 - 99 mg/dL   Dg Chest 2 View  Result Date: 08/11/2017 CLINICAL DATA:  62 year old female with shortness of breath. EXAM: CHEST  2 VIEW COMPARISON:  Chest CT dated 08/01/2017 FINDINGS: There is shallow inspiration with bibasilar atelectatic changes. Mild diffuse chronic interstitial coarsening and bronchitic changes. No focal consolidation, pleural effusion, or pneumothorax. The left-sided PICC with tip over central SVC. Top-normal cardiac size. Lower cervical ACDF. No acute osseous pathology. IMPRESSION: 1. Shallow inspiration with bibasilar atelectasis and interstitial coarsening. No focal consolidation. 2. Left-sided PICC with tip over central SVC. Electronically Signed   By: Anner Crete M.D.   On: 08/11/2017 01:36    Pending Labs Unresulted Labs (From admission, onward)   Start     Ordered   08/12/17 3662  Basic metabolic panel  Tomorrow morning,   R     08/11/17 1002   08/12/17 0500  CBC  Tomorrow morning,   R     08/11/17 1002      Vitals/Pain Today's Vitals   08/11/17 1500 08/11/17 1523 08/11/17 1600 08/11/17 1700  BP: 129/72 129/72 119/65 132/72  Pulse: 89 88 84 82  Resp:  18 (!) 26 (!) 24  Temp:      TempSrc:      SpO2: 97% 97% 95% 99%  PainSc:  Isolation  Precautions No active isolations  Medications Medications  ipratropium-albuterol (DUONEB) 0.5-2.5 (3) MG/3ML nebulizer solution 3 mL (not administered)  azaTHIOprine (IMURAN) tablet 25 mg (25 mg Oral Given 08/11/17 1139)  aspirin EC tablet 81 mg (81 mg Oral Given 08/11/17 1141)  furosemide (LASIX) tablet 80 mg (80 mg Oral Given 08/11/17 1141)  metoprolol tartrate (LOPRESSOR) tablet 25 mg (25 mg Oral Given 08/11/17 1141)  amLODipine (NORVASC) tablet 10 mg (10 mg Oral Given 08/11/17 1207)  oxyCODONE-acetaminophen (PERCOCET/ROXICET) 5-325 MG per tablet 1 tablet (not administered)  fenofibrate tablet 160 mg (160 mg Oral Given 08/11/17 1141)  atorvastatin (LIPITOR) tablet 80 mg (80 mg Oral Given 08/11/17 1141)  tamsulosin (FLOMAX) capsule 0.4 mg (not administered)  metoCLOPramide (REGLAN) tablet 5 mg (5 mg Oral Given 08/11/17 1653)  insulin glargine (LANTUS) injection 25 Units (25 Units Subcutaneous Given 08/11/17 1140)  insulin aspart (novoLOG) injection 20 Units (20 Units Subcutaneous Given 08/11/17 1654)  predniSONE (DELTASONE) tablet 20 mg (not administered)  mometasone-formoterol (DULERA) 200-5 MCG/ACT inhaler 2 puff (2 puffs Inhalation Not Given 08/11/17 1058)  bisacodyl (DULCOLAX) EC tablet 5 mg (not administered)  liraglutide (VICTOZA) SOPN 1.2 mg (1.2 mg Subcutaneous Given 08/11/17 1205)  cyclobenzaprine (FLEXERIL) tablet 5 mg (not administered)  potassium chloride SA (K-DUR,KLOR-CON) CR tablet 20 mEq (20 mEq Oral Given 08/11/17 1143)  sodium chloride flush (NS) 0.9 % injection 3 mL (3 mLs Intravenous Given 08/11/17 1207)  sodium chloride flush (NS) 0.9 % injection 3 mL (not administered)  0.9 %  sodium chloride infusion (not administered)  acetaminophen (TYLENOL) tablet 650 mg (not administered)    Or  acetaminophen (TYLENOL) suppository 650 mg (not administered)  traZODone (DESYREL) tablet 50 mg (not administered)  senna (SENOKOT) tablet 8.6 mg (8.6 mg Oral Given 08/11/17 1143)  polyethylene  glycol (MIRALAX / GLYCOLAX) packet 17 g (not administered)  ondansetron (ZOFRAN) tablet 4 mg (not administered)    Or  ondansetron (ZOFRAN) injection 4 mg (not administered)  heparin injection 5,000 Units (5,000 Units Subcutaneous Given 08/11/17 1653)  0.9 %  sodium chloride infusion ( Intravenous New Bag/Given 08/11/17 1206)  ipratropium-albuterol (DUONEB) 0.5-2.5 (3) MG/3ML nebulizer solution 3 mL (3 mLs Nebulization Given 08/11/17 1425)  guaiFENesin (MUCINEX) 12 hr tablet 600 mg (600 mg Oral Given 08/11/17 1143)  azithromycin (ZITHROMAX) tablet 250 mg (not administered)  insulin aspart (novoLOG) injection 0-9 Units (9 Units Subcutaneous Given 08/11/17 1654)  tiotropium (SPIRIVA) inhalation capsule 18 mcg (not administered)  ipratropium-albuterol (DUONEB) 0.5-2.5 (3) MG/3ML nebulizer solution 3 mL (3 mLs Nebulization Given 08/11/17 0149)  methylPREDNISolone sodium succinate (SOLU-MEDROL) 125 mg/2 mL injection 125 mg (125 mg Intravenous Given 08/11/17 0229)  azithromycin (ZITHROMAX) 500 mg in dextrose 5 % 250 mL IVPB (0 mg Intravenous Stopped 08/11/17 1433)    Mobility walks with person assist

## 2017-08-11 NOTE — ED Provider Notes (Signed)
[facility]-EMERGENCY DEPT Provider Note   CSN: [id number] Arrival date & time: 08/10/17  2308     History   Chief Complaint Chief Complaint  Patient presents with  . Shortness of Breath    HPI Elizabeth Montes is a 62 y.o. female.  Patient is a 62 year old female with past medical history of CHF, COPD, diabetes, polymyositis.  She presents today for evaluation of shortness of breath.  She was recently admitted at [facility] for pneumonia.  She was discharged several days ago.  Since returning home, she has experienced worsening breathing, continued productive cough, and generalized malaise.  According to the patient, she is here visiting from out of town.  She has been hospitalized in her hometown for pneumonia in the past and the patient and family feels as though she may have been discharged too soon.  Patient denies any chest pain.   The history is provided by the patient.  Shortness of Breath  This is a recurrent problem. The average episode lasts 2 days. The problem occurs continuously.The problem has been gradually worsening. Associated symptoms include cough and sputum production. Pertinent negatives include no fever, no chest pain and no leg swelling. Associated medical issues include COPD, CAD and heart failure.    Past Medical History:  Diagnosis Date  . CHF (congestive heart failure) (HCC)   . COPD (chronic obstructive pulmonary disease) (HCC)   . Diabetes mellitus without complication (HCC)   . Heart attack (HCC)   . Hypertension     Patient Active Problem List   Diagnosis Date Noted  . Acute respiratory failure with hypoxia (HCC) 08/01/2017  . Healthcare-associated pneumonia 08/01/2017  . Diabetes mellitus (HCC) 08/01/2017  . Uncontrolled type 2 diabetes mellitus with hyperosmolar nonketotic hyperglycemia (HCC) 07/16/2017  . CHF, acute on chronic (HCC) 07/16/2017  . Acute on chronic respiratory failure with hypoxia (HCC) 07/16/2017  . COPD  (chronic obstructive pulmonary disease) (HCC) 07/16/2017  . Hypertension 07/16/2017  . 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 Acute renal failure superimposed on stage 2 chronic kidney disease (HCC) 07/16/2017    Past Surgical History:  Procedure Laterality Date  . ABDOMINAL SURGERY    . TRACHEOSTOMY      OB History    No data available       Home Medications    Prior to Admission medications   Medication Sig Start Date End Date Taking? Authorizing Provider  amLODipine (NORVASC) 10 MG tablet Take 10 mg by mouth daily.   Yes [provider]  aspirin EC 81 MG tablet Take 81 mg by mouth daily.   Yes [provider]  atorvastatin (LIPITOR) 80 MG tablet Take 80 mg by mouth daily.   Yes [provider]  azaTHIOprine (IMURAN) 50 MG tablet Take 25 mg by mouth daily.   Yes [provider]  bisacodyl (DULCOLAX) 5 MG EC tablet Take 5 mg by mouth daily as needed for mild constipation.  07/13/17  Yes [provider]  budesonide-formoterol (SYMBICORT) 160-4.5 MCG/ACT inhaler Inhale 2 puffs into the lungs 2 (two) times daily. 07/05/17 10/03/17 Yes [provider]  cyclobenzaprine (FLEXERIL) 5 MG tablet Take 5 mg by mouth 3 (three) times daily as needed for muscle spasms.   Yes [provider]  fenofibrate 160 MG tablet Take 160 mg by mouth daily.   Yes [provider]  furosemide (LASIX) 80 MG tablet Take 80 mg by mouth daily.   Yes [provider]  insulin aspart (NOVOLOG FLEXPEN)  100 UNIT/ML FlexPen Inject 20 Units into the skin 3 (three) times daily with meals.    Yes [provider]  Insulin Glargine (TOUJEO SOLOSTAR) 300 UNIT/ML SOPN Inject 25 Units into the skin every morning.    Yes [provider]  liraglutide (VICTOZA) 18 MG/3ML SOPN Inject 1.2 mg into the skin daily. 07/28/17  Yes [provider]  metoCLOPramide (REGLAN) 10 MG tablet Take 5 mg by mouth 3 (three) times daily before meals.    Yes [provider]  metoprolol tartrate (LOPRESSOR) 25 MG tablet Take 25 mg by mouth 2 (two) times daily.   Yes [provider]  oxyCODONE-acetaminophen (PERCOCET/ROXICET) 5-325 MG tablet Take 1 tablet by mouth every 4 (four) hours as needed for severe pain.   Yes [provider]  potassium chloride SA (K-DUR,KLOR-CON) 20 MEQ tablet Take 20 mEq by mouth daily. 04/05/17 04/05/18 Yes [provider]  predniSONE (DELTASONE) 20 MG tablet Take 20 mg by mouth daily with breakfast.   Yes [provider]  tamsulosin (FLOMAX) 0.4 MG CAPS capsule Take 0.4 mg by mouth daily after supper.   Yes [provider]  tiotropium (SPIRIVA HANDIHALER) 18 MCG inhalation capsule Place 18 mcg into inhaler and inhale daily as needed (for wheezing).    Yes [provider]  doxycycline (VIBRA-TABS) 100 MG tablet Take 1 tablet (100 mg total) by mouth every 12 (twelve) hours. Patient not taking: Reported on 08/11/2017 08/03/17   Penny Pia, MD    Family History History reviewed. No pertinent family history.  Social History Social History   Tobacco Use  . Smoking status: Never Smoker  . Smokeless tobacco: Never Used  Substance Use Topics  . Alcohol use: No  . Drug use: No     Allergies   Lovenox [enoxaparin sodium]   Review of Systems Review of Systems  Constitutional: Negative for fever.  Respiratory: Positive for cough, sputum production and shortness of breath.   Cardiovascular: Negative for chest pain and leg swelling.  All other systems reviewed and are negative.    Physical Exam Updated Vital Signs BP (!) 173/83   Pulse (!) 109   Temp 98.8 F (37.1 C) (Oral)   Resp 18   SpO2 91%   Physical Exam  Constitutional: She is oriented to person, place, and time. She appears well-developed and well-nourished. No distress.  HENT:  Head: Normocephalic and atraumatic.  Neck: Normal range of motion. Neck supple.  Cardiovascular: Normal  rate and regular rhythm. Exam reveals no gallop and no friction rub.  No murmur heard. Pulmonary/Chest: Effort normal. No respiratory distress. She has no wheezes. She has rales in the right lower field and the left lower field.  There are rales in the bases bilaterally.  Abdominal: Soft. Bowel sounds are normal. She exhibits no distension. There is no tenderness.  Musculoskeletal: Normal range of motion.       Right lower leg: Normal. She exhibits no edema.       Left lower leg: Normal. She exhibits no edema.  Neurological: She is alert and oriented to person, place, and time.  Skin: Skin is warm and dry. She is not diaphoretic.  Nursing note and vitals reviewed.    ED Treatments / Results  Labs (all labs ordered are listed, but only abnormal results are displayed) Labs Reviewed  BASIC METABOLIC PANEL  CBC WITH DIFFERENTIAL/PLATELET  BRAIN NATRIURETIC PEPTIDE  I-STAT TROPONIN, ED    EKG  EKG Interpretation None  Radiology No results found.  Procedures Procedures (including critical care time)  Medications Ordered in ED Medications  ipratropium-albuterol (DUONEB) 0.5-2.5 (3) MG/3ML nebulizer solution 3 mL (not administered)     Initial Impression / Assessment and Plan / ED Course  I have reviewed the triage vital signs and the nursing notes.  Pertinent labs & imaging results that were available during my care of the patient were reviewed by me and considered in my medical decision making (see chart for details).  Patient with history of CHF, COPD, interstitial lung disease, and recent admission for pneumonia presenting with shortness of breath and productive cough.  Her presentation and workup is most consistent with a recurrent COPD exacerbation.  The patient describes dyspnea and severe weakness with minimal exertion and does not feel as though she can go home.  She was given IV steroids and a nebulizer treatment with little relief.  I have discussed the patient  with Dr. Katrinka Blazing who agrees to admit for further workup and treatment.  Final Clinical Impressions(s) / ED Diagnoses   Final diagnoses:  None    ED Discharge Orders    None       Geoffery Lyons, MD 08/11/17 [phone]

## 2017-08-11 NOTE — ED Notes (Signed)
Attempted to start an IV x 2 with no success. Asked Terri, RN for assistance with obtaining an IV.

## 2017-08-11 NOTE — H&P (Signed)
Patient Demographics:    Elizabeth Montes, is a 62 y.o. female  MRN: [id number]   DOB - 1955-12-10  Admit Date - 08/11/2017  Outpatient Primary MD for the patient is Patient, No Pcp Per   Assessment & Plan:    Principal Problem:   COPD with acute exacerbation (HCC) Active Problems:   Hypertension   Diabetes mellitus (HCC)   COPD exacerbation (HCC)    1)Acute COPD Exacerbation/IPF- no definite pneumonia,  treat empirically with IV Solu-Medrol 40 mg every 12 hours, give mucolytics, Azithromycin and bronchodilators as ordered, supplemental oxygen as ordered.  Leukocytosis is secondary to recent steroid use, no fevers or chills  2)DM-anticipate worsening glycemic control with steroids, c/n Lantus 25 units daily and  Victoza, sliding scale insulin as ordered  3)HTN-restart amlodipine 10 mg daily, iv labetalol as needed for elevated BP  4)HFpEF-patient with history of chronic diastolic dysfunction CHF, last known EF 55-60%, appears well compensated at this time, no acute flareup, BNP is not elevated, clinical exam and chest x-ray findings does not support any exacerbation of CHF at this time, continue Lasix 80 mg daily  5) acute on chronic hypoxic respiratory failure-due to COPD and idiopathic pulmonary fibrosis,.  At baseline patient uses 2-3 L of oxygen, worsening respiratory status and hypoxia due to #1 above  6) polymyositis-appears to be stable at this time, continue Imuran and steroids  7)HTN-continue amlodipine 10 mg daily  With History of - Reviewed by me  Past Medical History:  Diagnosis Date  . CHF (congestive heart failure) (HCC)   . COPD (chronic obstructive pulmonary disease) (HCC)   . Diabetes mellitus without complication (HCC)   . Heart attack (HCC)   . Hypertension       Past Surgical History:    Procedure Laterality Date  . ABDOMINAL SURGERY    . TRACHEOSTOMY      Chief Complaint  Patient presents with  . Shortness of Breath      HPI:    Elizabeth Montes  is a 62 y.o. female with past medical history relevant for oxygen dependent COPD, history of IPF, chronic diastolic dysfunction CHF, polymyositis, hypertension and diabetes mellitus who presents to the ED with complaint of worsening shortness of breath and cough, no fevers or chills.  No chest pains no orthopnea, no pleuritic symptoms.  Neck pain, swelling  Just admitted into the hospital from 08/01/17 thru 08/03/17 for acute respiratory failure with hypoxia thought possibly secondary to aspiration and COPD.  Patient was sent home on doxycycline and reported taking medications as prescribed .  In ED-     patient was found to be tachypneic, tachycardic, with  increased work of breathing,  WBC 16.6(trending upward) the patient has been on steroids, hemoglobin 10.6 (stable), BNP 82.5.  Chest x-ray showing poor inspiratory effort without focal consolidation.  Patient was given 125 mg of Solu-Medrol and a DuoNeb treatment.    Worsening hypoxia with increased oxygen requirement noted  in the ED   Review of systems:    In addition to the HPI above,   A full 12 point Review of 10 Systems was done, except as stated above, all other Review of 10 Systems were negative.    Social History:  Reviewed by me    Social History   Tobacco Use  . Smoking status: Never Smoker  . Smokeless tobacco: Never Used  Substance Use Topics  . Alcohol use: No       Family History :  Reviewed by me   History reviewed. No pertinent family history. HTN   Home Medications:   Prior to Admission medications   Medication Sig Start Date End Date Taking? Authorizing Provider  amLODipine (NORVASC) 10 MG tablet Take 10 mg by mouth daily.   Yes [provider]  aspirin EC 81 MG tablet Take 81 mg by mouth daily.   Yes [provider]   atorvastatin (LIPITOR) 80 MG tablet Take 80 mg by mouth daily.   Yes [provider]  azaTHIOprine (IMURAN) 50 MG tablet Take 25 mg by mouth daily.   Yes [provider]  bisacodyl (DULCOLAX) 5 MG EC tablet Take 5 mg by mouth daily as needed for mild constipation.  07/13/17  Yes [provider]  budesonide-formoterol (SYMBICORT) 160-4.5 MCG/ACT inhaler Inhale 2 puffs into the lungs 2 (two) times daily. 07/05/17 10/03/17 Yes [provider]  cyclobenzaprine (FLEXERIL) 5 MG tablet Take 5 mg by mouth 3 (three) times daily as needed for muscle spasms.   Yes [provider]  fenofibrate 160 MG tablet Take 160 mg by mouth daily.   Yes [provider]  furosemide (LASIX) 80 MG tablet Take 80 mg by mouth daily.   Yes [provider]  insulin aspart (NOVOLOG FLEXPEN) 100 UNIT/ML FlexPen Inject 20 Units into the skin 3 (three) times daily with meals.    Yes [provider]  Insulin Glargine (TOUJEO SOLOSTAR) 300 UNIT/ML SOPN Inject 25 Units into the skin every morning.    Yes [provider]  liraglutide (VICTOZA) 18 MG/3ML SOPN Inject 1.2 mg into the skin daily. 07/28/17  Yes [provider]  metoCLOPramide (REGLAN) 10 MG tablet Take 5 mg by mouth 3 (three) times daily before meals.   Yes [provider]  metoprolol tartrate (LOPRESSOR) 25 MG tablet Take 25 mg by mouth 2 (two) times daily.   Yes [provider]  oxyCODONE-acetaminophen (PERCOCET/ROXICET) 5-325 MG tablet Take 1 tablet by mouth every 4 (four) hours as needed for severe pain.   Yes [provider]  potassium chloride SA (K-DUR,KLOR-CON) 20 MEQ tablet Take 20 mEq by mouth daily. 04/05/17 04/05/18 Yes [provider]  predniSONE (DELTASONE) 20 MG tablet Take 20 mg by mouth daily with breakfast.   Yes [provider]  tamsulosin (FLOMAX) 0.4 MG CAPS capsule Take 0.4 mg by mouth daily after supper.   Yes [provider]  tiotropium (SPIRIVA HANDIHALER) 18 MCG inhalation capsule Place 18 mcg into inhaler and inhale daily as needed (for wheezing).    Yes [provider]  doxycycline (VIBRA-TABS) 100 MG tablet Take 1 tablet (100 mg total) by mouth every 12 (twelve) hours. Patient not taking: Reported on 08/11/2017 08/03/17   Penny Pia, MD     Allergies:     Allergies  Allergen Reactions  . Lovenox [Enoxaparin Sodium] Other (See Comments)    "made me bleed"     Physical Exam:   Vitals  Blood pressure 132/72, pulse 82, temperature 98.8 F (37.1 C), temperature source Oral, resp. rate (!) 24, SpO2 99 %.  Physical Examination: General appearance - alert, well appearing, and in no distress Mental status - alert, oriented to person, place, and time,  Eyes - sclera anicteric Nose- [place] 3 L/min Neck - supple, no JVD elevation , Chest -diminished breath sounds with scattered wheezes bilaterally, symmetrical air movement,  Heart - S1 and S2 normal,  Abdomen - soft, nontender, nondistended, no masses or organomegaly Neurological - screening mental status exam normal, neck supple without rigidity, cranial nerves II through XII intact, DTR's normal and symmetric Extremities - no pedal edema noted, intact peripheral pulses  Skin - warm, dry    Data Review:    CBC Recent Labs  Lab 08/11/17 0103  WBC 16.1*  HGB 10.6*  HCT 32.9*  PLT 454*  MCV 95.6  MCH 30.8  MCHC 32.2  RDW 17.4*  LYMPHSABS 2.9  MONOABS 1.6*  EOSABS 0.2  BASOSABS 0.0   ------------------------------------------------------------------------------------------------------------------  Chemistries  Recent Labs  Lab 08/11/17 0103  NA 141  K 3.0*  CL 111  CO2 23  GLUCOSE 104*  BUN 13  CREATININE 0.76  CALCIUM 8.8*   ------------------------------------------------------------------------------------------------------------------ estimated creatinine clearance is 79.4 mL/min (by C-G formula based on  SCr of 0.76 mg/dL). ------------------------------------------------------------------------------------------------------------------ No results for input(s): TSH, T4TOTAL, T3FREE, THYROIDAB in the last 72 hours.  Invalid input(s): FREET3   Coagulation profile No results for input(s): INR, PROTIME in the last 168 hours. ------------------------------------------------------------------------------------------------------------------- No results for input(s): DDIMER in the last 72 hours. -------------------------------------------------------------------------------------------------------------------  Cardiac Enzymes No results for input(s): CKMB, TROPONINI, MYOGLOBIN in the last 168 hours.  Invalid input(s): CK ------------------------------------------------------------------------------------------------------------------    Component Value Date/Time   BNP 82.5 08/11/2017 0103     ---------------------------------------------------------------------------------------------------------------  Urinalysis    Component Value Date/Time   COLORURINE YELLOW 07/16/2017 0526   APPEARANCEUR CLEAR 07/16/2017 0526   LABSPEC 1.010 07/16/2017 0526   PHURINE 5.0 07/16/2017 0526   GLUCOSEU >=500 (A) 07/16/2017 0526   HGBUR NEGATIVE 07/16/2017 0526   BILIRUBINUR NEGATIVE 07/16/2017 0526   KETONESUR NEGATIVE 07/16/2017 0526   PROTEINUR NEGATIVE 07/16/2017 0526   NITRITE NEGATIVE 07/16/2017 0526   LEUKOCYTESUR NEGATIVE 07/16/2017 0526    ----------------------------------------------------------------------------------------------------------------   Imaging Results:    Dg Chest 2 View  Result Date: 08/11/2017 CLINICAL DATA:  62 year old female with shortness of breath. EXAM: CHEST  2 VIEW COMPARISON:  Chest CT dated 08/01/2017 FINDINGS: There is shallow inspiration with bibasilar atelectatic changes. Mild diffuse chronic interstitial coarsening and bronchitic changes. No focal  consolidation, pleural effusion, or pneumothorax. The left-sided PICC with tip over central SVC. Top-normal cardiac size. Lower cervical ACDF. No acute osseous pathology. IMPRESSION: 1. Shallow inspiration with bibasilar atelectasis and interstitial coarsening. No focal consolidation. 2. Left-sided PICC with tip over central SVC. Electronically Signed   By: Elgie Collard M.D.   On: 08/11/2017 01:36    Radiological Exams on Admission: Dg Chest 2 View  Result Date: 08/11/2017 CLINICAL DATA:  62 year old female with shortness of breath. EXAM: CHEST  2 VIEW COMPARISON:  Chest CT dated 08/01/2017 FINDINGS: There is shallow inspiration with bibasilar atelectatic changes. Mild diffuse chronic interstitial coarsening and bronchitic changes. No focal consolidation, pleural effusion, or pneumothorax. The left-sided PICC with tip over central SVC. Top-normal cardiac size. Lower cervical ACDF. No acute osseous pathology. IMPRESSION: 1. Shallow inspiration with bibasilar atelectasis and interstitial coarsening. No focal consolidation. 2. Left-sided PICC with tip over central SVC.  Electronically Signed   By: Elgie Collard M.D.   On: 08/11/2017 01:36    DVT Prophylaxis -SCD   AM Labs Ordered, also please review Full Orders  Family Communication: Admission, patients condition and plan of care including tests being ordered have been discussed with the patient who indicate understanding and agree with the plan   Code Status - Full Code  Likely DC to  home  Condition   stable  Shon Hale M.D on 08/11/2017 at 6:20 PM   Between 7am to 7pm - Pager - [phone] After 7pm go to [email or web address] - password [email or web address]  [facility] - Office  [phone]  Voice Recognition Reubin Milan dictation system was used to create this note, attempts have been made to correct errors. Please contact the author with questions and/or clarifications.

## 2017-08-12 DIAGNOSIS — I10 Essential (primary) hypertension: Secondary | ICD-10-CM

## 2017-08-12 DIAGNOSIS — J441 Chronic obstructive pulmonary disease with (acute) exacerbation: Secondary | ICD-10-CM | POA: Diagnosis not present

## 2017-08-12 DIAGNOSIS — M3320 Polymyositis, organ involvement unspecified: Secondary | ICD-10-CM

## 2017-08-12 DIAGNOSIS — J8410 Pulmonary fibrosis, unspecified: Secondary | ICD-10-CM | POA: Diagnosis not present

## 2017-08-12 LAB — CBC
HEMATOCRIT: 33 % — AB (ref 36.0–46.0)
HEMOGLOBIN: 10.6 g/dL — AB (ref 12.0–15.0)
MCH: 30.5 pg (ref 26.0–34.0)
MCHC: 32.1 g/dL (ref 30.0–36.0)
MCV: 95.1 fL (ref 78.0–100.0)
Platelets: 491 10*3/uL — ABNORMAL HIGH (ref 150–400)
RBC: 3.47 MIL/uL — AB (ref 3.87–5.11)
RDW: 16.8 % — ABNORMAL HIGH (ref 11.5–15.5)
WBC: 20.1 10*3/uL — ABNORMAL HIGH (ref 4.0–10.5)

## 2017-08-12 LAB — BASIC METABOLIC PANEL
ANION GAP: 8 (ref 5–15)
BUN: 24 mg/dL — ABNORMAL HIGH (ref 6–20)
CALCIUM: 8.5 mg/dL — AB (ref 8.9–10.3)
CO2: 22 mmol/L (ref 22–32)
Chloride: 107 mmol/L (ref 101–111)
Creatinine, Ser: 0.87 mg/dL (ref 0.44–1.00)
GFR calc Af Amer: >60 mL/min (ref >60)
GLUCOSE: 281 mg/dL — AB (ref 65–99)
POTASSIUM: 3.9 mmol/L (ref 3.5–5.1)
Sodium: 137 mmol/L (ref 135–145)

## 2017-08-12 LAB — GLUCOSE, CAPILLARY
GLUCOSE-CAPILLARY: 162 mg/dL — AB (ref 65–99)
Glucose-Capillary: 240 mg/dL — ABNORMAL HIGH (ref 65–99)

## 2017-08-12 MED ORDER — HEPARIN SOD (PORK) LOCK FLUSH 100 UNIT/ML IV SOLN
500.0000 [IU] | INTRAVENOUS | Status: AC | PRN
  Administered 2017-08-12: 500 [IU]

## 2017-08-12 MED ORDER — PREDNISONE 10 MG PO TABS: ORAL_TABLET | ORAL | 0 refills | Status: DC

## 2017-08-12 MED ORDER — METHYLPREDNISOLONE SODIUM SUCC 125 MG IJ SOLR: 60.0000 mg | Freq: Three times a day (TID) | INTRAMUSCULAR | Status: DC

## 2017-08-12 NOTE — Progress Notes (Addendum)
Inpatient Diabetes Program Recommendations  AACE/ADA: New Consensus Statement on Inpatient Glycemic Control (2015)  Target Ranges:  Prepandial:   less than 140 mg/dL      Peak postprandial:   less than 180 mg/dL (1-2 hours)      Critically ill patients:  140 - 180 mg/dL   Results for Elizabeth Montes, Elizabeth Montes (MRN [id number]) as of 08/12/2017 09:19  Ref. Range 08/11/2017 13:05 08/11/2017 14:36 08/11/2017 16:39 08/11/2017 23:25  Glucose-Capillary Latest Ref Range: 65 - 99 mg/dL 088 (H) 110 (H) 315 (H) 161 (H)   Results for Elizabeth Montes, Elizabeth Montes (MRN [id number]) as of 08/12/2017 09:19  Ref. Range 08/12/2017 07:40  Glucose-Capillary Latest Ref Range: 65 - 99 mg/dL 446 (H)    Admit with: COPD  History: DM  Home DM Meds: Toujeo 25 units daily       Novolog 20 units TID       Victoza 1.2 mg daily  Current Insulin Orders: Lantus 25 units daily      Novolog Sensitive Correction Scale/ SSI (0-9 units) TID AC       Novolog 20 units TID      Victoza 1.2 mg daily       MD- Note Solumedrol increased to 60 mg Q8 hours today.  Fasting glucose elevated to 240 mg/dl this AM.  Please consider increasing Lantus to 30 units daily (20% increase)  Please also consider increasing Novolog SSI to Resistant scale (0-20 units) TID AC + HS      --Will follow patient during hospitalization--  Ambrose Finland RN, MSN, CDE Diabetes Coordinator Inpatient Glycemic Control Team Team Pager: [phone] (8a-5p)

## 2017-08-12 NOTE — Progress Notes (Signed)
Pt ambulated 180 feet half circle-----sat dropped to 86 % on 4l, pt states home O2 maintance is 3-4 liters. Pt noted with some SOB reocover to 90-91 %4 liters. SRP, RN

## 2017-08-12 NOTE — Care Management Obs Status (Signed)
MEDICARE OBSERVATION STATUS NOTIFICATION   Patient Details  Name: Elizabeth Montes MRN: [id number] Date of Birth: 16-Dec-1955   Medicare Observation Status Notification Given:  Yes    Geni Bers, RN 08/12/2017, 1:04 PM

## 2017-08-12 NOTE — Discharge Summary (Signed)
PATIENT DETAILS Name: Elizabeth Montes Age: 62 y.o. Sex: female Date of Birth: 24-Dec-1955 MRN: [id number]. Admitting Physician: Shon Hale, MD WUJ:[id number], No Pcp Per  Admit Date: 08/11/2017 Discharge date: 08/12/2017  Recommendations for Outpatient Follow-up:  1. Follow up with PCP in 1-2 weeks 2. Please obtain BMP/CBC in one week  Admitted From:  Home  Disposition: Home   Home Health: No  Equipment/Devices: Continue oxygen 3-4 L as previous.   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  See below for further details.    Brief Hospital Course: COPD exacerbation: Rapidly improved overnight with steroids, bronchodilators-this morning she feels much better.  She was ambulated around the unit by nursing staff-and claims to me that she is back to her usual baseline, she would like to be discharged today.  Since she has improved-we will discharged on tapering prednisone-will be tapered down to 20 mg which she will be continued on until she sees her primary care practitioner.  She would also continue her usual bronchodilator regimen.  Chronic diastolic heart failure: Shortness of breath was probably secondary to COPD exacerbation-no signs of fluid overload on exam.  Continue Lasix.  DM-2: CBGs relatively stable-slightly elevated due to steroids-she will be tapered off the steroids-slow CBG should improve with time.  Continue Toujeo and other agents as previous.  Hypertension: Controlled with  amlodipine  Pulmonary fibrosis: Follow-up with pulmonology-she is on chronic home O2.  She claims that she is on prednisone 20 mg a day for the past 8 months or so.  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  History of CAD: Apparently underwent PCI in 2017-continue aspirin, statin.  No anginal symptoms.  Procedures/Studies: None  Discharge Diagnoses:  Principal Problem:   COPD with acute exacerbation (HCC) Active Problems:   Hypertension   Diabetes mellitus (HCC)   COPD exacerbation (HCC)   Discharge Instructions:  Activity:  As tolerated with Full fall precautions use walker/cane & assistance as needed  Discharge Instructions    (HEART FAILURE PATIENTS) Call MD:  Anytime you have any of the following symptoms: 1) 3 pound weight gain in 24 hours or 5 pounds in 1 week 2) shortness of breath, with or without a dry hacking cough 3) swelling in the hands, feet or stomach 4) if you have to sleep on extra pillows at night in order to breathe.   Complete by:  As directed    Call MD for:  difficulty breathing, headache or visual disturbances   Complete by:  As directed    Diet - low sodium heart healthy   Complete by:  As directed    Diet Carb Modified   Complete by:  As directed    Increase activity slowly   Complete by:  As directed      Allergies as of 08/12/2017      Reactions   Lovenox [enoxaparin Sodium] Other (See Comments)   "made me bleed"      Medication List    STOP taking these medications   doxycycline 100 MG tablet Commonly known  as:  VIBRA-TABS     TAKE these medications   amLODipine 10 MG tablet Commonly known as:  NORVASC Take 10 mg by mouth daily.   aspirin EC 81 MG tablet Take 81 mg by mouth daily.   atorvastatin 80 MG tablet Commonly known as:  LIPITOR Take 80 mg by mouth daily.   azaTHIOprine 50 MG tablet Commonly known as:  IMURAN Take 25 mg by mouth daily.   bisacodyl 5 MG EC tablet Commonly known as:   DULCOLAX Take 5 mg by mouth daily as needed for mild constipation.   cyclobenzaprine 5 MG tablet Commonly known as:  FLEXERIL Take 5 mg by mouth 3 (three) times daily as needed for muscle spasms.   fenofibrate 160 MG tablet Take 160 mg by mouth daily.   furosemide 80 MG tablet Commonly known as:  LASIX Take 80 mg by mouth daily.   liraglutide 18 MG/3ML Sopn Commonly known as:  VICTOZA Inject 1.2 mg into the skin daily.   metoCLOPramide 10 MG tablet Commonly known as:  REGLAN Take 5 mg by mouth 3 (three) times daily before meals.   metoprolol tartrate 25 MG tablet Commonly known as:  LOPRESSOR Take 25 mg by mouth 2 (two) times daily.   NOVOLOG FLEXPEN 100 UNIT/ML FlexPen Generic drug:  insulin aspart Inject 20 Units into the skin 3 (three) times daily with meals.   oxyCODONE-acetaminophen 5-325 MG tablet Commonly known as:  PERCOCET/ROXICET Take 1 tablet by mouth every 4 (four) hours as needed for severe pain.   potassium chloride SA 20 MEQ tablet Commonly known as:  K-DUR,KLOR-CON Take 20 mEq by mouth daily.   predniSONE 10 MG tablet Commonly known as:  DELTASONE Take 60 mg (6 tablets) p.o. daily for 2 days, then, Take 50 mg (5 tablets) p.o. daily for 2 days, then, Take 40 mg (4 tablets) p.o. daily for 2 days, then, Take 30 mg (3 tablets) p.o. daily for 2 days, then, Continue taking 20 mg p.o. daily until seen by her regular doctor. What changed:    medication strength  how much to take  how to take this  when to take this  additional instructions   SPIRIVA HANDIHALER 18 MCG inhalation capsule Generic drug:  tiotropium Place 18 mcg into inhaler and inhale daily as needed (for wheezing).   SYMBICORT 160-4.5 MCG/ACT inhaler Generic drug:  budesonide-formoterol Inhale 2 puffs into the lungs 2 (two) times daily.   tamsulosin 0.4 MG Caps capsule Commonly known as:  FLOMAX Take 0.4 mg by mouth daily after supper.   TOUJEO SOLOSTAR 300 UNIT/ML  Sopn Generic drug:  Insulin Glargine Inject 25 Units into the skin every morning.      Follow-up Information    Your primary care MD in [place]. Schedule an appointment as soon as possible for a visit in 1 week(s).          Allergies  Allergen Reactions  . Lovenox [Enoxaparin Sodium] Other (See Comments)    "made me bleed"    Consultations:   None   Other Procedures/Studies: Dg Chest 2 View  Result Date: 08/11/2017 CLINICAL DATA:  62 year old female with shortness of breath. EXAM: CHEST  2 VIEW COMPARISON:  Chest CT dated 08/01/2017 FINDINGS: There is shallow inspiration with bibasilar atelectatic changes. Mild diffuse chronic interstitial coarsening and bronchitic changes. No focal consolidation, pleural effusion, or pneumothorax. The left-sided PICC with tip over central SVC. Top-normal cardiac size. Lower cervical ACDF. No acute osseous pathology. IMPRESSION: 1. Shallow inspiration with  bibasilar atelectasis and interstitial coarsening. No focal consolidation. 2. Left-sided PICC with tip over central SVC. Electronically Signed   By: Elgie Collard M.D.   On: 08/11/2017 01:36   Dg Chest 2 View  Result Date: 07/15/2017 CLINICAL DATA:  Shortness of breath and cough EXAM: CHEST  2 VIEW COMPARISON:  CTA chest 12/19/2016 FINDINGS: Diffuse interstitial prominence. No pleural effusion or pneumothorax. Bibasilar atelectasis. Normal cardiomediastinal contours. The tip of the left PICC line is at the cavoatrial junction. IMPRESSION: Chronic findings of COPD without focal airspace consolidation. Electronically Signed   By: Deatra Robinson M.D.   On: 07/15/2017 23:48   Ct Angio Chest Pe W Or Wo Contrast  Result Date: 08/01/2017 CLINICAL DATA:  62 year old female with acute shortness of breath today. EXAM: CT ANGIOGRAPHY CHEST WITH CONTRAST TECHNIQUE: Multidetector CT imaging of the chest was performed using the standard protocol during bolus administration of intravenous contrast. Multiplanar  CT image reconstructions and MIPs were obtained to evaluate the vascular anatomy. CONTRAST:  ISOVUE-370 IOPAMIDOL (ISOVUE-370) INJECTION 76% COMPARISON:  08/01/2017 chest radiograph, 12/19/2016 chest CT and prior studies FINDINGS: Cardiovascular: This is a technically satisfactory study for evaluation of pulmonary emboli. No pulmonary emboli are identified. Cardiomegaly and coronary artery atherosclerotic calcifications again noted. Thoracic aortic atherosclerotic calcifications and plaque noted without aneurysm. No pericardial effusion. Mediastinum/Nodes: Mediastinal and bilateral hilar lymph nodes are unchanged given technique. A 8 mm right peritracheal lymph node (image 34, series 5) and a 1.5 cm right hilar node (image 52, series 5) are again identified. No mediastinal mass or other mediastinal abnormality. Lungs/Pleura: Mild central ground-glass opacities and increased peripheral and tears DISH stool opacities may represent edema. Bilateral lower lobe ground-glass opacities are identified as well as right lower lobe airspace disease/consolidation. Septal/ peripheral interstitial prominence within the mid and lower lungs are again noted. No pleural effusion or pneumothorax. No discrete mass identified. Upper Abdomen: No acute abnormality. Musculoskeletal: No acute abnormality or suspicious bony lesion. Cervical spine fusion hardware again noted. Review of the MIP images confirms the above findings. IMPRESSION: 1. No evidence of pulmonary emboli or thoracic aortic aneurysm. 2. Central ground-glass opacities and increased interstitial opacities bilaterally, nonspecific but may represent edema. 3. More focal right lower lobe airspace disease/consolidation which may represent pneumonia. 4. Findings suspicious for interstitial lung disease again noted. 5. Cardiomegaly and coronary artery disease 6. Aortic Atherosclerosis (ICD10-I70.0) and Emphysema (ICD10-J43.9). Electronically Signed   By: Harmon Pier M.D.    On: 08/01/2017 08:14   Dg Chest Port 1 View  Result Date: 08/01/2017 CLINICAL DATA:  62 y/o F; shortness of breath. History of COPD and congestive heart failure. EXAM: PORTABLE CHEST 1 VIEW COMPARISON:  07/15/2017 chest radiograph FINDINGS: Stable normal cardiac silhouette given projection and technique. Left PICC line tip projects over upper SVC. Aortic atherosclerosis with calcification. COPD. Coarse reticular markings in the lung bases. No consolidation. No pleural effusion or pneumothorax. No acute osseous abnormality is evident. Anterior cervical discectomy and fusion hardware. IMPRESSION: Increase coarse reticular markings in lung bases may represent progression of suspected interstitial lung disease on prior CT. Possible infiltrate in left lung base. Electronically Signed   By: Mitzi Hansen M.D.   On: 08/01/2017 04:14      TODAY-DAY OF DISCHARGE:  Subjective:   Lilyan Punt today has no headache,no chest abdominal pain,no new weakness tingling or numbness, feels much better wants to go home today.   Objective:   Blood pressure (!) 154/64, pulse (!) 101, temperature (!) 97.5 F (  36.4 C), temperature source Oral, resp. rate (!) 22, height 5\' 3"  (1.6 m), weight 88.2 kg (194 lb 7.1 oz), SpO2 93 %.  Intake/Output Summary (Last 24 hours) at 08/12/2017 1153 Last data filed at 08/12/2017 0900 Gross per 24 hour  Intake 988.83 ml  Output -  Net 988.83 ml   Filed Weights   08/11/17 1815  Weight: 88.2 kg (194 lb 7.1 oz)    Exam: Awake Alert, Oriented *3, No new F.N deficits, Normal affect [place].AT,PERRAL Supple Neck,No JVD, No cervical lymphadenopathy appriciated.  Symmetrical Chest wall movement, Good air movement bilaterally, CTAB RRR,No Gallops,Rubs or new Murmurs, No Parasternal Heave +ve B.Sounds, Abd Soft, Non tender, No organomegaly appriciated, No rebound -guarding or rigidity. No Cyanosis, Clubbing or edema, No new Rash or bruise   PERTINENT RADIOLOGIC STUDIES: Dg  Chest 2 View  Result Date: 08/11/2017 CLINICAL DATA:  62 year old female with shortness of breath. EXAM: CHEST  2 VIEW COMPARISON:  Chest CT dated 08/01/2017 FINDINGS: There is shallow inspiration with bibasilar atelectatic changes. Mild diffuse chronic interstitial coarsening and bronchitic changes. No focal consolidation, pleural effusion, or pneumothorax. The left-sided PICC with tip over central SVC. Top-normal cardiac size. Lower cervical ACDF. No acute osseous pathology. IMPRESSION: 1. Shallow inspiration with bibasilar atelectasis and interstitial coarsening. No focal consolidation. 2. Left-sided PICC with tip over central SVC. Electronically Signed   By: Elgie Collard M.D.   On: 08/11/2017 01:36   Dg Chest 2 View  Result Date: 07/15/2017 CLINICAL DATA:  Shortness of breath and cough EXAM: CHEST  2 VIEW COMPARISON:  CTA chest 12/19/2016 FINDINGS: Diffuse interstitial prominence. No pleural effusion or pneumothorax. Bibasilar atelectasis. Normal cardiomediastinal contours. The tip of the left PICC line is at the cavoatrial junction. IMPRESSION: Chronic findings of COPD without focal airspace consolidation. Electronically Signed   By: Deatra Robinson M.D.   On: 07/15/2017 23:48   Ct Angio Chest Pe W Or Wo Contrast  Result Date: 08/01/2017 CLINICAL DATA:  62 year old female with acute shortness of breath today. EXAM: CT ANGIOGRAPHY CHEST WITH CONTRAST TECHNIQUE: Multidetector CT imaging of the chest was performed using the standard protocol during bolus administration of intravenous contrast. Multiplanar CT image reconstructions and MIPs were obtained to evaluate the vascular anatomy. CONTRAST:  ISOVUE-370 IOPAMIDOL (ISOVUE-370) INJECTION 76% COMPARISON:  08/01/2017 chest radiograph, 12/19/2016 chest CT and prior studies FINDINGS: Cardiovascular: This is a technically satisfactory study for evaluation of pulmonary emboli. No pulmonary emboli are identified. Cardiomegaly and coronary artery  atherosclerotic calcifications again noted. Thoracic aortic atherosclerotic calcifications and plaque noted without aneurysm. No pericardial effusion. Mediastinum/Nodes: Mediastinal and bilateral hilar lymph nodes are unchanged given technique. A 8 mm right peritracheal lymph node (image 34, series 5) and a 1.5 cm right hilar node (image 52, series 5) are again identified. No mediastinal mass or other mediastinal abnormality. Lungs/Pleura: Mild central ground-glass opacities and increased peripheral and tears DISH stool opacities may represent edema. Bilateral lower lobe ground-glass opacities are identified as well as right lower lobe airspace disease/consolidation. Septal/ peripheral interstitial prominence within the mid and lower lungs are again noted. No pleural effusion or pneumothorax. No discrete mass identified. Upper Abdomen: No acute abnormality. Musculoskeletal: No acute abnormality or suspicious bony lesion. Cervical spine fusion hardware again noted. Review of the MIP images confirms the above findings. IMPRESSION: 1. No evidence of pulmonary emboli or thoracic aortic aneurysm. 2. Central ground-glass opacities and increased interstitial opacities bilaterally, nonspecific but may represent edema. 3. More focal right lower lobe airspace disease/consolidation  which may represent pneumonia. 4. Findings suspicious for interstitial lung disease again noted. 5. Cardiomegaly and coronary artery disease 6. Aortic Atherosclerosis (ICD10-I70.0) and Emphysema (ICD10-J43.9). Electronically Signed   By: Harmon Pier M.D.   On: 08/01/2017 08:14   Dg Chest Port 1 View  Result Date: 08/01/2017 CLINICAL DATA:  62 y/o F; shortness of breath. History of COPD and congestive heart failure. EXAM: PORTABLE CHEST 1 VIEW COMPARISON:  07/15/2017 chest radiograph FINDINGS: Stable normal cardiac silhouette given projection and technique. Left PICC line tip projects over upper SVC. Aortic atherosclerosis with calcification.  COPD. Coarse reticular markings in the lung bases. No consolidation. No pleural effusion or pneumothorax. No acute osseous abnormality is evident. Anterior cervical discectomy and fusion hardware. IMPRESSION: Increase coarse reticular markings in lung bases may represent progression of suspected interstitial lung disease on prior CT. Possible infiltrate in left lung base. Electronically Signed   By: Mitzi Hansen M.D.   On: 08/01/2017 04:14     PERTINENT LAB RESULTS: CBC: Recent Labs    08/11/17 0103 08/12/17 0437  WBC 16.1* 20.1*  HGB 10.6* 10.6*  HCT 32.9* 33.0*  PLT 454* 491*   CMET CMP     Component Value Date/Time   NA 137 08/12/2017 0437   K 3.9 08/12/2017 0437   CL 107 08/12/2017 0437   CO2 22 08/12/2017 0437   GLUCOSE 281 (H) 08/12/2017 0437   BUN 24 (H) 08/12/2017 0437   CREATININE 0.87 08/12/2017 0437   CALCIUM 8.5 (L) 08/12/2017 0437   PROT 6.5 08/01/2017 0400   ALBUMIN 3.2 (L) 08/01/2017 0400   AST 45 (H) 08/01/2017 0400   ALT 36 08/01/2017 0400   ALKPHOS 72 08/01/2017 0400   BILITOT 1.4 (H) 08/01/2017 0400   GFRNONAA >60 08/12/2017 0437   GFRAA >60 08/12/2017 0437    GFR Estimated Creatinine Clearance: 73.3 mL/min (by C-G formula based on SCr of 0.87 mg/dL). No results for input(s): LIPASE, AMYLASE in the last 72 hours. No results for input(s): CKTOTAL, CKMB, CKMBINDEX, TROPONINI in the last 72 hours. Invalid input(s): POCBNP No results for input(s): DDIMER in the last 72 hours. No results for input(s): HGBA1C in the last 72 hours. No results for input(s): CHOL, HDL, LDLCALC, TRIG, CHOLHDL, LDLDIRECT in the last 72 hours. No results for input(s): TSH, T4TOTAL, T3FREE, THYROIDAB in the last 72 hours.  Invalid input(s): FREET3 No results for input(s): VITAMINB12, FOLATE, FERRITIN, TIBC, IRON, RETICCTPCT in the last 72 hours. Coags: No results for input(s): INR in the last 72 hours.  Invalid input(s): PT Microbiology: No results found for  this or any previous visit (from the past 240 hour(s)).  FURTHER DISCHARGE INSTRUCTIONS:  Get Medicines reviewed and adjusted: Please take all your medications with you for your next visit with your Primary MD  Laboratory/radiological data: Please request your Primary MD to go over all hospital tests and procedure/radiological results at the follow up, please ask your Primary MD to get all Hospital records sent to his/her office.  In some cases, they will be blood work, cultures and biopsy results pending at the time of your discharge. Please request that your primary care M.D. goes through all the records of your hospital data and follows up on these results.  Also Note the following: If you experience worsening of your admission symptoms, develop shortness of breath, life threatening emergency, suicidal or homicidal thoughts you must seek medical attention immediately by calling 911 or calling your MD immediately  if symptoms less severe.  You must read complete instructions/literature along with all the possible adverse reactions/side effects for all the Medicines you take and that have been prescribed to you. Take any new Medicines after you have completely understood and accpet all the possible adverse reactions/side effects.   Do not drive when taking Pain medications or sleeping medications (Benzodaizepines)  Do not take more than prescribed Pain, Sleep and Anxiety Medications. It is not advisable to combine anxiety,sleep and pain medications without talking with your primary care practitioner  Special Instructions: If you have smoked or chewed Tobacco  in the last 2 yrs please stop smoking, stop any regular Alcohol  and or any Recreational drug use.  Wear Seat belts while driving.  Please note: You were cared for by a hospitalist during your hospital stay. Once you are discharged, your primary care physician will handle any further medical issues. Please note that NO REFILLS for any  discharge medications will be authorized once you are discharged, as it is imperative that you return to your primary care physician (or establish a relationship with a primary care physician if you do not have one) for your post hospital discharge needs so that they can reassess your need for medications and monitor your lab values.  Total Time spent coordinating discharge including counseling, education and face to face time equals 45 minutes.  SignedJeoffrey Massed 08/12/2017 11:53 AM

## 2017-09-13 ENCOUNTER — Encounter (HOSPITAL_COMMUNITY): Payer: Self-pay | Admitting: Emergency Medicine

## 2017-09-13 ENCOUNTER — Emergency Department (HOSPITAL_COMMUNITY): Payer: Medicare HMO

## 2017-09-13 ENCOUNTER — Inpatient Hospital Stay (HOSPITAL_COMMUNITY)
Admission: EM | Admit: 2017-09-13 | Discharge: 2017-09-17 | DRG: 193 | Disposition: A | Discharge status: Home Health | Payer: Medicare HMO | Attending: Internal Medicine | Admitting: Internal Medicine

## 2017-09-13 ENCOUNTER — Other Ambulatory Visit: Payer: Self-pay

## 2017-09-13 DIAGNOSIS — E669 Obesity, unspecified: Secondary | ICD-10-CM | POA: Diagnosis present

## 2017-09-13 DIAGNOSIS — T501X6A Underdosing of loop [high-ceiling] diuretics, initial encounter: Secondary | ICD-10-CM | POA: Diagnosis present

## 2017-09-13 DIAGNOSIS — E872 Acidosis: Secondary | ICD-10-CM | POA: Diagnosis present

## 2017-09-13 DIAGNOSIS — Z794 Long term (current) use of insulin: Secondary | ICD-10-CM | POA: Diagnosis not present

## 2017-09-13 DIAGNOSIS — J441 Chronic obstructive pulmonary disease with (acute) exacerbation: Secondary | ICD-10-CM | POA: Diagnosis present

## 2017-09-13 DIAGNOSIS — Z7952 Long term (current) use of systemic steroids: Secondary | ICD-10-CM

## 2017-09-13 DIAGNOSIS — T380X5A Adverse effect of glucocorticoids and synthetic analogues, initial encounter: Secondary | ICD-10-CM | POA: Diagnosis present

## 2017-09-13 DIAGNOSIS — Z6834 Body mass index (BMI) 34.0-34.9, adult: Secondary | ICD-10-CM

## 2017-09-13 DIAGNOSIS — Y95 Nosocomial condition: Secondary | ICD-10-CM | POA: Diagnosis present

## 2017-09-13 DIAGNOSIS — M3320 Polymyositis, organ involvement unspecified: Secondary | ICD-10-CM | POA: Diagnosis present

## 2017-09-13 DIAGNOSIS — J8410 Pulmonary fibrosis, unspecified: Secondary | ICD-10-CM | POA: Diagnosis present

## 2017-09-13 DIAGNOSIS — I252 Old myocardial infarction: Secondary | ICD-10-CM | POA: Diagnosis not present

## 2017-09-13 DIAGNOSIS — J9601 Acute respiratory failure with hypoxia: Secondary | ICD-10-CM | POA: Diagnosis not present

## 2017-09-13 DIAGNOSIS — I10 Essential (primary) hypertension: Secondary | ICD-10-CM | POA: Diagnosis not present

## 2017-09-13 DIAGNOSIS — Z7951 Long term (current) use of inhaled steroids: Secondary | ICD-10-CM

## 2017-09-13 DIAGNOSIS — J189 Pneumonia, unspecified organism: Secondary | ICD-10-CM | POA: Diagnosis present

## 2017-09-13 DIAGNOSIS — I5032 Chronic diastolic (congestive) heart failure: Secondary | ICD-10-CM | POA: Diagnosis present

## 2017-09-13 DIAGNOSIS — Z7982 Long term (current) use of aspirin: Secondary | ICD-10-CM | POA: Diagnosis not present

## 2017-09-13 DIAGNOSIS — R509 Fever, unspecified: Secondary | ICD-10-CM

## 2017-09-13 DIAGNOSIS — J9600 Acute respiratory failure, unspecified whether with hypoxia or hypercapnia: Secondary | ICD-10-CM | POA: Diagnosis not present

## 2017-09-13 DIAGNOSIS — Z9981 Dependence on supplemental oxygen: Secondary | ICD-10-CM | POA: Diagnosis not present

## 2017-09-13 DIAGNOSIS — J9621 Acute and chronic respiratory failure with hypoxia: Secondary | ICD-10-CM | POA: Diagnosis present

## 2017-09-13 DIAGNOSIS — I110 Hypertensive heart disease with heart failure: Secondary | ICD-10-CM | POA: Diagnosis present

## 2017-09-13 DIAGNOSIS — E860 Dehydration: Secondary | ICD-10-CM | POA: Diagnosis present

## 2017-09-13 DIAGNOSIS — Z9114 Patient's other noncompliance with medication regimen: Secondary | ICD-10-CM | POA: Diagnosis not present

## 2017-09-13 DIAGNOSIS — J440 Chronic obstructive pulmonary disease with acute lower respiratory infection: Secondary | ICD-10-CM | POA: Diagnosis present

## 2017-09-13 DIAGNOSIS — E119 Type 2 diabetes mellitus without complications: Secondary | ICD-10-CM

## 2017-09-13 LAB — CBC WITH DIFFERENTIAL/PLATELET
BASOS PCT: 0 %
Basophils Absolute: 0.1 10*3/uL (ref 0.0–0.1)
EOS ABS: 0.3 10*3/uL (ref 0.0–0.7)
Eosinophils Relative: 2 %
HEMATOCRIT: 40.5 % (ref 36.0–46.0)
HEMOGLOBIN: 13 g/dL (ref 12.0–15.0)
LYMPHS ABS: 2.2 10*3/uL (ref 0.7–4.0)
Lymphocytes Relative: 12 %
MCH: 29.3 pg (ref 26.0–34.0)
MCHC: 32.1 g/dL (ref 30.0–36.0)
MCV: 91.2 fL (ref 78.0–100.0)
MONO ABS: 1.8 10*3/uL — AB (ref 0.1–1.0)
Monocytes Relative: 10 %
NEUTROS ABS: 14.2 10*3/uL — AB (ref 1.7–7.7)
Neutrophils Relative %: 76 %
Platelets: 497 10*3/uL — ABNORMAL HIGH (ref 150–400)
RBC: 4.44 MIL/uL (ref 3.87–5.11)
RDW: 15.5 % (ref 11.5–15.5)
WBC: 18.5 10*3/uL — ABNORMAL HIGH (ref 4.0–10.5)

## 2017-09-13 LAB — COMPREHENSIVE METABOLIC PANEL
ALK PHOS: 91 U/L (ref 38–126)
ALT: 72 U/L — AB (ref 14–54)
ANION GAP: 15 (ref 5–15)
AST: 61 U/L — ABNORMAL HIGH (ref 15–41)
Albumin: 3 g/dL — ABNORMAL LOW (ref 3.5–5.0)
BUN: 10 mg/dL (ref 6–20)
CALCIUM: 9 mg/dL (ref 8.9–10.3)
CO2: 23 mmol/L (ref 22–32)
CREATININE: 1.04 mg/dL — AB (ref 0.44–1.00)
Chloride: 102 mmol/L (ref 101–111)
GFR, EST NON AFRICAN AMERICAN: 58 mL/min — AB (ref >60)
Glucose, Bld: 160 mg/dL — ABNORMAL HIGH (ref 65–99)
Potassium: 2.6 mmol/L — CL (ref 3.5–5.1)
Sodium: 140 mmol/L (ref 135–145)
TOTAL PROTEIN: 6.4 g/dL — AB (ref 6.5–8.1)
Total Bilirubin: 0.5 mg/dL (ref 0.3–1.2)

## 2017-09-13 LAB — LIPASE, BLOOD: LIPASE: 34 U/L (ref 11–51)

## 2017-09-13 LAB — BRAIN NATRIURETIC PEPTIDE: B Natriuretic Peptide: 41.3 pg/mL (ref 0.0–100.0)

## 2017-09-13 LAB — INFLUENZA PANEL BY PCR (TYPE A & B)
INFLBPCR: NEGATIVE
Influenza A By PCR: NEGATIVE

## 2017-09-13 LAB — TROPONIN I

## 2017-09-13 LAB — STREP PNEUMONIAE URINARY ANTIGEN: STREP PNEUMO URINARY ANTIGEN: NEGATIVE

## 2017-09-13 LAB — HEMOGLOBIN A1C
Hgb A1c MFr Bld: 8.5 % — ABNORMAL HIGH (ref 4.8–5.6)
MEAN PLASMA GLUCOSE: 197.25 mg/dL

## 2017-09-13 LAB — CBG MONITORING, ED
GLUCOSE-CAPILLARY: 149 mg/dL — AB (ref 65–99)
GLUCOSE-CAPILLARY: 238 mg/dL — AB (ref 65–99)
Glucose-Capillary: 459 mg/dL — ABNORMAL HIGH (ref 65–99)

## 2017-09-13 LAB — LACTIC ACID, PLASMA
LACTIC ACID, VENOUS: 2.2 mmol/L — AB (ref 0.5–1.9)
LACTIC ACID, VENOUS: 5 mmol/L — AB (ref 0.5–1.9)

## 2017-09-13 LAB — POTASSIUM: Potassium: 4 mmol/L (ref 3.5–5.1)

## 2017-09-13 LAB — GLUCOSE, CAPILLARY: Glucose-Capillary: 501 mg/dL (ref 65–99)

## 2017-09-13 LAB — MAGNESIUM: Magnesium: 1.5 mg/dL — ABNORMAL LOW (ref 1.7–2.4)

## 2017-09-13 MED ORDER — INSULIN ASPART 100 UNIT/ML ~~LOC~~ SOLN
15.0000 [IU] | Freq: Once | SUBCUTANEOUS | Status: AC
  Administered 2017-09-13: 15 [IU] via SUBCUTANEOUS

## 2017-09-13 MED ORDER — INSULIN GLARGINE 100 UNIT/ML ~~LOC~~ SOLN: 15.0000 [IU] | Freq: Every day | SUBCUTANEOUS | Status: DC

## 2017-09-13 MED ORDER — INSULIN ASPART 100 UNIT/ML ~~LOC~~ SOLN
20.0000 [IU] | Freq: Three times a day (TID) | SUBCUTANEOUS | Status: DC
  Administered 2017-09-13 – 2017-09-17 (×12): 20 [IU] via SUBCUTANEOUS
  Filled 2017-09-13: qty 1

## 2017-09-13 MED ORDER — METOPROLOL TARTRATE 25 MG PO TABS
25.0000 mg | ORAL_TABLET | Freq: Two times a day (BID) | ORAL | Status: DC
  Administered 2017-09-13 – 2017-09-17 (×8): 25 mg via ORAL
  Filled 2017-09-13 (×8): qty 1

## 2017-09-13 MED ORDER — SODIUM CHLORIDE 0.9 % IV BOLUS (SEPSIS)
500.0000 mL | Freq: Once | INTRAVENOUS | Status: AC
  Administered 2017-09-13: 500 mL via INTRAVENOUS

## 2017-09-13 MED ORDER — ATORVASTATIN CALCIUM 80 MG PO TABS: 80.0000 mg | ORAL_TABLET | Freq: Every day | ORAL | Status: DC

## 2017-09-13 MED ORDER — LIRAGLUTIDE 18 MG/3ML ~~LOC~~ SOPN: 1.2000 mg | PEN_INJECTOR | Freq: Every day | SUBCUTANEOUS | Status: DC

## 2017-09-13 MED ORDER — TAMSULOSIN HCL 0.4 MG PO CAPS: 0.4000 mg | ORAL_CAPSULE | Freq: Every day | ORAL | Status: DC

## 2017-09-13 MED ORDER — ALBUTEROL SULFATE (2.5 MG/3ML) 0.083% IN NEBU
2.5000 mg | INHALATION_SOLUTION | RESPIRATORY_TRACT | Status: DC | PRN
  Filled 2017-09-13: qty 3

## 2017-09-13 MED ORDER — ONDANSETRON HCL 4 MG/2ML IJ SOLN: 4.0000 mg | Freq: Four times a day (QID) | INTRAMUSCULAR | Status: DC | PRN

## 2017-09-13 MED ORDER — ACETAMINOPHEN 325 MG PO TABS
650.0000 mg | ORAL_TABLET | Freq: Once | ORAL | Status: AC
  Administered 2017-09-13: 650 mg via ORAL
  Filled 2017-09-13: qty 2

## 2017-09-13 MED ORDER — PIPERACILLIN-TAZOBACTAM 3.375 G IVPB 30 MIN
3.3750 g | Freq: Once | INTRAVENOUS | Status: DC
  Filled 2017-09-13: qty 50

## 2017-09-13 MED ORDER — INSULIN ASPART 100 UNIT/ML ~~LOC~~ SOLN
0.0000 [IU] | Freq: Three times a day (TID) | SUBCUTANEOUS | Status: DC
Start: 2017-09-13 — End: 2017-09-13
  Administered 2017-09-13: 1 [IU] via SUBCUTANEOUS
  Filled 2017-09-13: qty 1

## 2017-09-13 MED ORDER — ACETAMINOPHEN 650 MG RE SUPP: 650.0000 mg | Freq: Four times a day (QID) | RECTAL | Status: DC | PRN

## 2017-09-13 MED ORDER — INSULIN GLARGINE 100 UNIT/ML ~~LOC~~ SOLN
15.0000 [IU] | Freq: Every day | SUBCUTANEOUS | Status: DC
  Administered 2017-09-13: 15 [IU] via SUBCUTANEOUS
  Filled 2017-09-13 (×3): qty 0.15

## 2017-09-13 MED ORDER — CYCLOBENZAPRINE HCL 10 MG PO TABS: 5.0000 mg | ORAL_TABLET | Freq: Three times a day (TID) | ORAL | Status: DC | PRN

## 2017-09-13 MED ORDER — POTASSIUM CHLORIDE 10 MEQ/100ML IV SOLN
10.0000 meq | INTRAVENOUS | Status: AC
  Administered 2017-09-13 (×3): 10 meq via INTRAVENOUS
  Filled 2017-09-13 (×3): qty 100

## 2017-09-13 MED ORDER — FENOFIBRATE 160 MG PO TABS
160.0000 mg | ORAL_TABLET | Freq: Every day | ORAL | Status: DC
  Administered 2017-09-14 – 2017-09-17 (×4): 160 mg via ORAL
  Filled 2017-09-13 (×4): qty 1

## 2017-09-13 MED ORDER — POTASSIUM CHLORIDE CRYS ER 20 MEQ PO TBCR: 20.0000 meq | EXTENDED_RELEASE_TABLET | Freq: Every day | ORAL | Status: DC

## 2017-09-13 MED ORDER — ONDANSETRON HCL 4 MG PO TABS: 4.0000 mg | ORAL_TABLET | Freq: Four times a day (QID) | ORAL | Status: DC | PRN

## 2017-09-13 MED ORDER — METHYLPREDNISOLONE SODIUM SUCC 40 MG IJ SOLR
40.0000 mg | Freq: Every day | INTRAMUSCULAR | Status: DC
  Administered 2017-09-13 – 2017-09-14 (×2): 40 mg via INTRAVENOUS
  Filled 2017-09-13 (×2): qty 1

## 2017-09-13 MED ORDER — INSULIN ASPART 100 UNIT/ML ~~LOC~~ SOLN
0.0000 [IU] | Freq: Three times a day (TID) | SUBCUTANEOUS | Status: DC
  Administered 2017-09-14: 11 [IU] via SUBCUTANEOUS
  Administered 2017-09-14: 5 [IU] via SUBCUTANEOUS
  Administered 2017-09-14: 11 [IU] via SUBCUTANEOUS
  Administered 2017-09-15 (×2): 5 [IU] via SUBCUTANEOUS
  Administered 2017-09-15: 8 [IU] via SUBCUTANEOUS
  Administered 2017-09-16: 3 [IU] via SUBCUTANEOUS
  Administered 2017-09-16 – 2017-09-17 (×3): 2 [IU] via SUBCUTANEOUS
  Administered 2017-09-17: 3 [IU] via SUBCUTANEOUS

## 2017-09-13 MED ORDER — METOCLOPRAMIDE HCL 5 MG PO TABS
5.0000 mg | ORAL_TABLET | Freq: Three times a day (TID) | ORAL | Status: DC
  Administered 2017-09-14 – 2017-09-17 (×11): 5 mg via ORAL
  Filled 2017-09-13 (×11): qty 1

## 2017-09-13 MED ORDER — BISACODYL 5 MG PO TBEC: 5.0000 mg | DELAYED_RELEASE_TABLET | Freq: Every day | ORAL | Status: DC | PRN

## 2017-09-13 MED ORDER — ACETAMINOPHEN 325 MG PO TABS: 650.0000 mg | ORAL_TABLET | Freq: Four times a day (QID) | ORAL | Status: DC | PRN

## 2017-09-13 MED ORDER — SODIUM CHLORIDE 0.9 % IV SOLN
1.0000 g | Freq: Three times a day (TID) | INTRAVENOUS | Status: DC
  Administered 2017-09-13 – 2017-09-17 (×13): 1 g via INTRAVENOUS
  Filled 2017-09-13 (×15): qty 1

## 2017-09-13 MED ORDER — IPRATROPIUM BROMIDE 0.02 % IN SOLN
0.5000 mg | Freq: Four times a day (QID) | RESPIRATORY_TRACT | Status: DC
  Administered 2017-09-13 (×3): 0.5 mg via RESPIRATORY_TRACT
  Filled 2017-09-13: qty 2.5

## 2017-09-13 MED ORDER — ALBUTEROL SULFATE (2.5 MG/3ML) 0.083% IN NEBU
2.5000 mg | INHALATION_SOLUTION | Freq: Three times a day (TID) | RESPIRATORY_TRACT | Status: DC
  Administered 2017-09-14 (×2): 2.5 mg via RESPIRATORY_TRACT
  Filled 2017-09-13 (×3): qty 3

## 2017-09-13 MED ORDER — INSULIN ASPART 100 UNIT/ML ~~LOC~~ SOLN
0.0000 [IU] | Freq: Every day | SUBCUTANEOUS | Status: DC
  Administered 2017-09-13: 5 [IU] via SUBCUTANEOUS
  Administered 2017-09-14: 4 [IU] via SUBCUTANEOUS
  Administered 2017-09-15: 3 [IU] via SUBCUTANEOUS

## 2017-09-13 MED ORDER — IPRATROPIUM BROMIDE 0.02 % IN SOLN: 0.5000 mg | Freq: Four times a day (QID) | RESPIRATORY_TRACT | Status: DC

## 2017-09-13 MED ORDER — ALBUTEROL SULFATE (2.5 MG/3ML) 0.083% IN NEBU
2.5000 mg | INHALATION_SOLUTION | RESPIRATORY_TRACT | Status: DC
  Administered 2017-09-13 (×4): 2.5 mg via RESPIRATORY_TRACT
  Filled 2017-09-13 (×2): qty 3

## 2017-09-13 MED ORDER — INSULIN GLARGINE 300 UNIT/ML ~~LOC~~ SOPN: 25.0000 [IU] | PEN_INJECTOR | SUBCUTANEOUS | Status: DC

## 2017-09-13 MED ORDER — AZATHIOPRINE 50 MG PO TABS
25.0000 mg | ORAL_TABLET | Freq: Every day | ORAL | Status: DC
  Administered 2017-09-14 – 2017-09-17 (×4): 25 mg via ORAL
  Filled 2017-09-13 (×4): qty 1

## 2017-09-13 MED ORDER — BUDESONIDE 0.25 MG/2ML IN SUSP
0.2500 mg | Freq: Two times a day (BID) | RESPIRATORY_TRACT | Status: DC
  Administered 2017-09-13 – 2017-09-17 (×7): 0.25 mg via RESPIRATORY_TRACT
  Filled 2017-09-13 (×10): qty 2

## 2017-09-13 MED ORDER — VANCOMYCIN HCL 10 G IV SOLR
1500.0000 mg | Freq: Once | INTRAVENOUS | Status: AC
  Administered 2017-09-13: 1500 mg via INTRAVENOUS
  Filled 2017-09-13: qty 1500

## 2017-09-13 MED ORDER — HEPARIN SODIUM (PORCINE) 5000 UNIT/ML IJ SOLN
5000.0000 [IU] | Freq: Three times a day (TID) | INTRAMUSCULAR | Status: DC
  Administered 2017-09-13 – 2017-09-14 (×5): 5000 [IU] via SUBCUTANEOUS
  Filled 2017-09-13 (×8): qty 1

## 2017-09-13 MED ORDER — DEXTROSE 5 % IV SOLN
3.0000 g | Freq: Once | INTRAVENOUS | Status: AC
  Administered 2017-09-13: 3 g via INTRAVENOUS
  Filled 2017-09-13: qty 6

## 2017-09-13 MED ORDER — AMLODIPINE BESYLATE 10 MG PO TABS
10.0000 mg | ORAL_TABLET | Freq: Every day | ORAL | Status: DC
  Administered 2017-09-14 – 2017-09-17 (×4): 10 mg via ORAL
  Filled 2017-09-13 (×4): qty 1

## 2017-09-13 MED ORDER — POTASSIUM CHLORIDE CRYS ER 20 MEQ PO TBCR
40.0000 meq | EXTENDED_RELEASE_TABLET | Freq: Once | ORAL | Status: AC
  Administered 2017-09-13: 40 meq via ORAL
  Filled 2017-09-13: qty 2

## 2017-09-13 MED ORDER — VANCOMYCIN HCL IN DEXTROSE 1-5 GM/200ML-% IV SOLN
1000.0000 mg | Freq: Once | INTRAVENOUS | Status: DC
  Filled 2017-09-13: qty 200

## 2017-09-13 MED ORDER — VANCOMYCIN HCL IN DEXTROSE 1-5 GM/200ML-% IV SOLN
1000.0000 mg | Freq: Two times a day (BID) | INTRAVENOUS | Status: AC
  Administered 2017-09-13 – 2017-09-16 (×7): 1000 mg via INTRAVENOUS
  Filled 2017-09-13 (×7): qty 200

## 2017-09-13 MED ORDER — FUROSEMIDE 10 MG/ML IJ SOLN
60.0000 mg | Freq: Once | INTRAMUSCULAR | Status: AC
  Administered 2017-09-13: 60 mg via INTRAVENOUS
  Filled 2017-09-13: qty 6

## 2017-09-13 MED ORDER — IPRATROPIUM BROMIDE 0.02 % IN SOLN
0.5000 mg | Freq: Three times a day (TID) | RESPIRATORY_TRACT | Status: DC
  Administered 2017-09-14 (×2): 0.5 mg via RESPIRATORY_TRACT
  Filled 2017-09-13 (×3): qty 2.5

## 2017-09-13 MED ORDER — ASPIRIN EC 81 MG PO TBEC
81.0000 mg | DELAYED_RELEASE_TABLET | Freq: Every day | ORAL | Status: DC
  Administered 2017-09-14 – 2017-09-17 (×4): 81 mg via ORAL
  Filled 2017-09-13 (×4): qty 1

## 2017-09-13 MED ORDER — OXYCODONE-ACETAMINOPHEN 5-325 MG PO TABS
1.0000 | ORAL_TABLET | ORAL | Status: DC | PRN
  Administered 2017-09-16 (×2): 1 via ORAL
  Filled 2017-09-13 (×2): qty 1

## 2017-09-13 MED ORDER — POTASSIUM CHLORIDE 10 MEQ/100ML IV SOLN
10.0000 meq | INTRAVENOUS | Status: AC
  Administered 2017-09-13 (×2): 10 meq via INTRAVENOUS
  Filled 2017-09-13 (×2): qty 100

## 2017-09-13 MED ORDER — POTASSIUM CHLORIDE 10 MEQ/100ML IV SOLN
10.0000 meq | Freq: Once | INTRAVENOUS | Status: AC
  Administered 2017-09-13: 10 meq via INTRAVENOUS
  Filled 2017-09-13: qty 100

## 2017-09-13 NOTE — ED Notes (Signed)
Refusing to be stuck.  IV team consulted to access port.

## 2017-09-13 NOTE — Progress Notes (Signed)
Pharmacy Antibiotic Note  Elizabeth Montes is a 62 y.o. female admitted on 09/13/2017 with SOB/possible PNA.  Pharmacy has been consulted for Vancomycin dosing.  Plan: Vancomycin 1500 mg IV now, then 1 g IV q12h  Height: 5\' 3"  (160 cm) Weight: 194 lb (88 kg) IBW/kg (Calculated) : 52.4  Temp (24hrs), Avg:100.6 F (38.1 C), Min:100 F (37.8 C), Max:101.2 F (38.4 C)  Recent Labs  Lab 09/13/17 0345  WBC 18.5*  CREATININE 1.04*  LATICACIDVEN 2.2*    Estimated Creatinine Clearance: 61.2 mL/min (A) (by C-G formula based on SCr of 1.04 mg/dL (H)).    Allergies  Allergen Reactions  . Lovenox [Enoxaparin Sodium] Other (See Comments)    "made me bleed"   Eddie Candle 09/13/2017 6:26 AM

## 2017-09-13 NOTE — ED Provider Notes (Signed)
[facility] EMERGENCY DEPARTMENT Provider Note   CSN: [id number] Arrival date & time: 09/13/17  0118     History   Chief Complaint Chief Complaint  Patient presents with  . Shortness of Breath    HPI Elizabeth Montes is a 62 y.o. female.  Patient with a history of HTN, T2DM, CHF, O2 dependent COPD here for cough and acute SOB that started yesterday. No fever or chills. She reports chest pain that is associated with cough only. She has had post-tussive vomiting. No congestion or sore throat. She moved here from [place] 3 months ago and has not been able to establish with a primary care physician. She is out of many of her medications including blood pressure medications and potassium supplement. She has not been taking her Lasix because she does not have her potassium. No significant LE swelling.    The history is provided by the patient and a relative. No language interpreter was used.  Shortness of Breath  Associated symptoms include cough, chest pain (See HPI.), vomiting (Post-tussive) and abdominal pain. Pertinent negatives include no fever and no leg swelling.    Past Medical History:  Diagnosis Date  . CHF (congestive heart failure) (HCC)   . COPD (chronic obstructive pulmonary disease) (HCC)   . Diabetes mellitus without complication (HCC)   . Heart attack (HCC)   . Hypertension     Patient Active Problem List   Diagnosis Date Noted  . COPD exacerbation (HCC) 08/11/2017  . COPD with acute exacerbation (HCC) 08/11/2017  . Acute respiratory failure with hypoxia (HCC) 08/01/2017  . Healthcare-associated pneumonia 08/01/2017  . Diabetes mellitus (HCC) 08/01/2017  . Uncontrolled type 2 diabetes mellitus with hyperosmolar nonketotic hyperglycemia (HCC) 07/16/2017  . CHF, acute on chronic (HCC) 07/16/2017  . Acute on chronic respiratory failure with hypoxia (HCC) 07/16/2017  . COPD (chronic obstructive pulmonary disease) (HCC) 07/16/2017  . Hypertension  07/16/2017  . 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 Acute renal failure superimposed on stage 2 chronic kidney disease (HCC) 07/16/2017    Past Surgical History:  Procedure Laterality Date  . ABDOMINAL SURGERY    . TRACHEOSTOMY      OB History    No data available       Home Medications    Prior to Admission medications   Medication Sig Start Date End Date Taking? Authorizing Provider  amLODipine (NORVASC) 10 MG tablet Take 10 mg by mouth daily.    [provider]  aspirin EC 81 MG tablet Take 81 mg by mouth daily.    [provider]  atorvastatin (LIPITOR) 80 MG tablet Take 80 mg by mouth daily.    [provider]  azaTHIOprine (IMURAN) 50 MG tablet Take 25 mg by mouth daily.    [provider]  bisacodyl (DULCOLAX) 5 MG EC tablet Take 5 mg by mouth daily as needed for mild constipation.  07/13/17   [provider]  budesonide-formoterol (SYMBICORT) 160-4.5 MCG/ACT inhaler Inhale 2 puffs into the lungs 2 (two) times daily. 07/05/17 10/03/17  [provider]  cyclobenzaprine (FLEXERIL) 5 MG tablet Take 5 mg by mouth 3 (three) times daily as needed for muscle spasms.    [provider]  fenofibrate 160 MG tablet Take 160 mg by mouth daily.    [provider]  furosemide (LASIX) 80 MG tablet Take 80 mg by mouth daily.    [provider]  insulin aspart (NOVOLOG FLEXPEN) 100 UNIT/ML FlexPen Inject 20 Units into the  skin 3 (three) times daily with meals.     [provider]  Insulin Glargine (TOUJEO SOLOSTAR) 300 UNIT/ML SOPN Inject 25 Units into the skin every morning.     [provider]  liraglutide (VICTOZA) 18 MG/3ML SOPN Inject 1.2 mg into the skin daily. 07/28/17   [provider]  metoCLOPramide (REGLAN) 10 MG tablet Take 5 mg by mouth 3 (three) times daily before meals.    [provider]  metoprolol tartrate (LOPRESSOR) 25 MG tablet Take 25 mg by mouth 2 (two) times  daily.    [provider]  oxyCODONE-acetaminophen (PERCOCET/ROXICET) 5-325 MG tablet Take 1 tablet by mouth every 4 (four) hours as needed for severe pain.    [provider]  potassium chloride SA (K-DUR,KLOR-CON) 20 MEQ tablet Take 20 mEq by mouth daily. 04/05/17 04/05/18  [provider]  predniSONE (DELTASONE) 10 MG tablet Take 60 mg (6 tablets) p.o. daily for 2 days, then, Take 50 mg (5 tablets) p.o. daily for 2 days, then, Take 40 mg (4 tablets) p.o. daily for 2 days, then, Take 30 mg (3 tablets) p.o. daily for 2 days, then, Continue taking 20 mg p.o. daily until seen by her regular doctor. 08/12/17   Ghimire, Werner Lean, MD  tamsulosin (FLOMAX) 0.4 MG CAPS capsule Take 0.4 mg by mouth daily after supper.    [provider]  tiotropium (SPIRIVA HANDIHALER) 18 MCG inhalation capsule Place 18 mcg into inhaler and inhale daily as needed (for wheezing).     [provider]    Family History No family history on file.  Social History Social History   Tobacco Use  . Smoking status: Never Smoker  . Smokeless tobacco: Never Used  Substance Use Topics  . Alcohol use: No  . Drug use: No     Allergies   Lovenox [enoxaparin sodium]   Review of Systems Review of Systems  Constitutional: Negative for chills and fever.  HENT: Negative.   Respiratory: Positive for cough and shortness of breath.   Cardiovascular: Positive for chest pain (See HPI.). Negative for leg swelling.  Gastrointestinal: Positive for abdominal pain and vomiting (Post-tussive). Negative for nausea.  Genitourinary: Negative.  Negative for decreased urine volume, dysuria and frequency.  Musculoskeletal: Negative.  Negative for myalgias.  Skin: Negative.   Neurological: Positive for weakness.     Physical Exam Updated Vital Signs BP (!) 151/71   Pulse (!) 125   Temp 100 F (37.8 C) (Oral)   Resp (!) 22   Ht 5\' 3"  (1.6 m)   Wt 88 kg (194 lb)   SpO2 99%   BMI 34.37  kg/m   Physical Exam  Constitutional: She is oriented to person, place, and time. She appears well-developed and well-nourished.  Non-toxic appearance. She appears ill.  HENT:  Head: Normocephalic.  Neck: Normal range of motion. Neck supple.  Cardiovascular: Normal rate and regular rhythm.  No murmur heard. Pulmonary/Chest: Effort normal. Tachypnea noted. She has decreased breath sounds. She has rales in the right lower field and the left lower field. She exhibits tenderness.  Abdominal: Soft. Bowel sounds are normal. There is no tenderness. There is no rebound and no guarding.  Musculoskeletal: Normal range of motion.       Right lower leg: She exhibits edema (1+).       Left lower leg: She exhibits edema (1+).  Neurological: She is alert and oriented to person, place, and time.  Skin: Skin is warm and dry. No  rash noted.  Psychiatric: She has a normal mood and affect.     ED Treatments / Results  Labs (all labs ordered are listed, but only abnormal results are displayed) Labs Reviewed  CBC WITH DIFFERENTIAL/PLATELET  BRAIN NATRIURETIC PEPTIDE  COMPREHENSIVE METABOLIC PANEL  LIPASE, BLOOD  TROPONIN I  LACTIC ACID, PLASMA  LACTIC ACID, PLASMA    EKG  EKG Interpretation  Date/Time:  Monday September 13 2017 01:21:24 EST Ventricular Rate:  124 PR Interval:    QRS Duration: 90 QT Interval:  359 QTC Calculation: 516 R Axis:   2 Text Interpretation:  Sinus tachycardia Multiple ventricular premature complexes Aberrant complex Probable left atrial enlargement Prolonged QT interval Tachycardia when compared to prior Confirmed by Ross Marcus ([id number]) on 09/13/2017 1:26:01 AM       Radiology No results found.  Procedures Procedures (including critical care time) CRITICAL CARE Performed by: Arnoldo Hooker   Total critical care time: 40 minutes  Critical care time was exclusive of separately billable procedures and treating other patients.  Critical care was  necessary to treat or prevent imminent or life-threatening deterioration.  Critical care was time spent personally by me on the following activities: development of treatment plan with patient and/or surrogate as well as nursing, discussions with consultants, evaluation of patient's response to treatment, examination of patient, obtaining history from patient or surrogate, ordering and performing treatments and interventions, ordering and review of laboratory studies, ordering and review of radiographic studies, pulse oximetry and re-evaluation of patient's condition.  Medications Ordered in ED Medications - No data to display   Initial Impression / Assessment and Plan / ED Course  I have reviewed the triage vital signs and the nursing notes.  Pertinent labs & imaging results that were available during my care of the patient were reviewed by me and considered in my medical decision making (see chart for details).     Patient presents for evaluation of SOB since yesterday. No known fever at home. Here she is found to have low grade temp of 100.  DDx COPD exacerbation vs CHF, in the setting of non-compliance with medications and continuous tobacco abuse. Favor CHF with uniform rales bilaterally.   CXR shows mild edema. She is given 60 mg Lasix given history, presenting symptoms and noncompliance with medication.   She remains tachycardic and tachypneic. Her breathing becomes increasingly labored. RT consulted for BiPap. Rectal temp checked and found elevated to 101.2. Tylenol for pain.  Blood pressure remains strong. Lactic acid slightly elevated at 2.2.   Hospitalist paged for admission. Discussed with Dr. Toniann Fail who accepts for admission.   Final Clinical Impressions(s) / ED Diagnoses   Final diagnoses:  None   1. Respiratory failure 2. Febrile illness  ED Discharge Orders    None       Elpidio Anis, PA-C 09/13/17 6045    Shon Baton, MD 09/14/17 [phone]

## 2017-09-13 NOTE — Progress Notes (Signed)
Received patient from ED, assessment completed, VS documented, oriented patient to the room.  CHG bath completed, MRSA swab sent, Will continue to monitor. 

## 2017-09-13 NOTE — H&P (Addendum)
History and Physical    Elizabeth Montes QJJ:[id number] DOB: November 01, 1955 DOA: 09/13/2017  PCP: Patient, No Pcp Per  Patient coming from: Home.  Chief Complaint: Shortness of breath.  HPI: Elizabeth Montes is a 62 y.o. female with history of chronic respiratory failure on home oxygen, pulmonary fibrosis, COPD, CHF, polymyositis, diabetes mellitus presents to the ER with complaints of shortness of breath.  Patient states he has been short of breath last 2 days with productive cough.  Also has been having some pleuritic type of chest pain.  Denies any fever chills.  ED Course: In the ER patient is found to be drowsy short of breath with crackles in the lung on exam.  Chest x-ray shows congestion..  Patient is febrile with temperatures around 102 F.  Blood cultures were obtained and influenza PCR is pending and patient started on empiric antibiotics for possible pneumonia.  Patient was placed on BiPAP.  On exam patient appears dehydrated.  Lactate is mildly elevated along with a creatinine being mildly elevated.  Review of Systems: As per HPI, rest all negative.   Past Medical History:  Diagnosis Date  . CHF (congestive heart failure) (HCC)   . COPD (chronic obstructive pulmonary disease) (HCC)   . Diabetes mellitus without complication (HCC)   . Heart attack (HCC)   . Hypertension     Past Surgical History:  Procedure Laterality Date  . ABDOMINAL SURGERY    . TRACHEOSTOMY       reports that  has never smoked. she has never used smokeless tobacco. She reports that she does not drink alcohol or use drugs.  Allergies  Allergen Reactions  . Lovenox [Enoxaparin Sodium] Other (See Comments)    "made me bleed"    Family History  Family history unknown: Yes    Prior to Admission medications   Medication Sig Start Date End Date Taking? Authorizing Provider  amLODipine (NORVASC) 10 MG tablet Take 10 mg by mouth daily.    [provider]  aspirin EC 81 MG tablet Take 81 mg by  mouth daily.    [provider]  atorvastatin (LIPITOR) 80 MG tablet Take 80 mg by mouth daily.    [provider]  azaTHIOprine (IMURAN) 50 MG tablet Take 25 mg by mouth daily.    [provider]  bisacodyl (DULCOLAX) 5 MG EC tablet Take 5 mg by mouth daily as needed for mild constipation.  07/13/17   [provider]  budesonide-formoterol (SYMBICORT) 160-4.5 MCG/ACT inhaler Inhale 2 puffs into the lungs 2 (two) times daily. 07/05/17 10/03/17  [provider]  cyclobenzaprine (FLEXERIL) 5 MG tablet Take 5 mg by mouth 3 (three) times daily as needed for muscle spasms.    [provider]  fenofibrate 160 MG tablet Take 160 mg by mouth daily.    [provider]  furosemide (LASIX) 80 MG tablet Take 80 mg by mouth daily.    [provider]  insulin aspart (NOVOLOG FLEXPEN) 100 UNIT/ML FlexPen Inject 20 Units into the skin 3 (three) times daily with meals.     [provider]  Insulin Glargine (TOUJEO SOLOSTAR) 300 UNIT/ML SOPN Inject 25 Units into the skin every morning.     [provider]  liraglutide (VICTOZA) 18 MG/3ML SOPN Inject 1.2 mg into the skin daily. 07/28/17   [provider]  metoCLOPramide (REGLAN) 10 MG tablet Take 5 mg by mouth 3 (three) times daily before meals.    [provider]  metoprolol tartrate (LOPRESSOR) 25 MG tablet Take 25 mg by mouth 2 (two) times daily.    [provider]  oxyCODONE-acetaminophen (PERCOCET/ROXICET) 5-325 MG tablet Take 1 tablet by mouth every 4 (four) hours as needed for severe pain.    [provider]  potassium chloride SA (K-DUR,KLOR-CON) 20 MEQ tablet Take 20 mEq by mouth daily. 04/05/17 04/05/18  [provider]  predniSONE (DELTASONE) 10 MG tablet Take 60 mg (6 tablets) p.o. daily for 2 days, then, Take 50 mg (5 tablets) p.o. daily for 2 days, then, Take 40 mg (4 tablets) p.o. daily for 2 days, then, Take 30 mg (3  tablets) p.o. daily for 2 days, then, Continue taking 20 mg p.o. daily until seen by her regular doctor. 08/12/17   Ghimire, Werner Lean, MD  tamsulosin (FLOMAX) 0.4 MG CAPS capsule Take 0.4 mg by mouth daily after supper.    [provider]  tiotropium (SPIRIVA HANDIHALER) 18 MCG inhalation capsule Place 18 mcg into inhaler and inhale daily as needed (for wheezing).     [provider]    Physical Exam: Vitals:   09/13/17 0136 09/13/17 0404 09/13/17 0408 09/13/17 0512  BP:      Pulse:  (!) 113  (!) 116  Resp:  (!) 21  19  Temp:   (!) 101.2 F (38.4 C)   TempSrc:   Rectal   SpO2:  93%  99%  Weight: 88 kg (194 lb)     Height: 5\' 3"  (1.6 m)         Constitutional: Moderately built and nourished. Vitals:   09/13/17 0136 09/13/17 0404 09/13/17 0408 09/13/17 0512  BP:      Pulse:  (!) 113  (!) 116  Resp:  (!) 21  19  Temp:   (!) 101.2 F (38.4 C)   TempSrc:   Rectal   SpO2:  93%  99%  Weight: 88 kg (194 lb)     Height: 5\' 3"  (1.6 m)      Eyes: Anicteric no pallor. ENMT: No discharge from the ears eyes nose or mouth. Neck: No mass felt.  No JVD appreciated. Respiratory: No rhonchi mild basilar crepitations. Cardiovascular: S1-S2 heard no murmurs appreciated. Abdomen: Soft nontender bowel sounds present. Musculoskeletal: No edema.  No joint effusion. Skin: No rash.  Skin appears warm. Neurologic: Patient appears drowsy but oriented to time place and person moves all extremities. Psychiatric: Mildly drowsy.   Labs on Admission: I have personally reviewed following labs and imaging studies  CBC: Recent Labs  Lab 09/13/17 0345  WBC 18.5*  NEUTROABS 14.2*  HGB 13.0  HCT 40.5  MCV 91.2  PLT 497*   Basic Metabolic Panel: Recent Labs  Lab 09/13/17 0345  NA 140  K 2.6*  CL 102  CO2 23  GLUCOSE 160*  BUN 10  CREATININE 1.04*  CALCIUM 9.0   GFR: Estimated Creatinine Clearance: 61.2 mL/min (A) (by C-G formula based on SCr of 1.04 mg/dL  (H)). Liver Function Tests: Recent Labs  Lab 09/13/17 0345  AST 61*  ALT 72*  ALKPHOS 91  BILITOT 0.5  PROT 6.4*  ALBUMIN 3.0*   Recent Labs  Lab 09/13/17 0345  LIPASE 34   No results for input(s): AMMONIA in the last 168 hours. Coagulation Profile: No results for input(s): INR, PROTIME in the last 168 hours. Cardiac Enzymes: Recent Labs  Lab 09/13/17 0345  TROPONINI <0.03   BNP (last 3 results) No results for input(s): PROBNP in the last  8760 hours. HbA1C: No results for input(s): HGBA1C in the last 72 hours. CBG: No results for input(s): GLUCAP in the last 168 hours. Lipid Profile: No results for input(s): CHOL, HDL, LDLCALC, TRIG, CHOLHDL, LDLDIRECT in the last 72 hours. Thyroid Function Tests: No results for input(s): TSH, T4TOTAL, FREET4, T3FREE, THYROIDAB in the last 72 hours. Anemia Panel: No results for input(s): VITAMINB12, FOLATE, FERRITIN, TIBC, IRON, RETICCTPCT in the last 72 hours. Urine analysis:    Component Value Date/Time   COLORURINE YELLOW 07/16/2017 0526   APPEARANCEUR CLEAR 07/16/2017 0526   LABSPEC 1.010 07/16/2017 0526   PHURINE 5.0 07/16/2017 0526   GLUCOSEU >=500 (A) 07/16/2017 0526   HGBUR NEGATIVE 07/16/2017 0526   BILIRUBINUR NEGATIVE 07/16/2017 0526   KETONESUR NEGATIVE 07/16/2017 0526   PROTEINUR NEGATIVE 07/16/2017 0526   NITRITE NEGATIVE 07/16/2017 0526   LEUKOCYTESUR NEGATIVE 07/16/2017 0526   Sepsis Labs: @LABRCNTIP (procalcitonin:4,lacticidven:4) )No results found for this or any previous visit (from the past 240 hour(s)).   Radiological Exams on Admission: Dg Chest Port 1 View  Result Date: 09/13/2017 CLINICAL DATA:  CHF.  Shortness of breath.  Chest pain.  Cough. EXAM: PORTABLE CHEST 1 VIEW COMPARISON:  Frontal and lateral views 08/11/2017 FINDINGS: Tip of the left upper extremity PICC in the proximal SVC. Heart is upper normal in size. Mediastinal contours are unchanged. Increased interstitial opacities above baseline  suspicious for pulmonary edema. Streaky bibasilar opacities are likely atelectasis. No large pleural effusion. No pneumothorax. IMPRESSION: 1. Mild pulmonary edema. 2. Increased bibasilar atelectasis. 3. Tip of the left upper extremity PICC in the proximal SVC. Electronically Signed   By: Rubye Oaks M.D.   On: 09/13/2017 02:46    EKG: Independently reviewed.  Sinus tachycardia.  Assessment/Plan Principal Problem:   Acute respiratory failure with hypoxia (HCC) Active Problems:   Hypertension   Polymyositis (HCC)   Healthcare-associated pneumonia   Chronic diastolic CHF (congestive heart failure) (HCC)   Diabetes mellitus type 2 in nonobese (HCC)   Acute respiratory failure (HCC)    1. Acute on chronic respiratory failure hypoxia on BiPAP-likely from developing pneumonia.  Influenza PCR is pending patient is placed on empiric antibiotics.  Since patient is complaining of pleuritic type of chest pain will get d-dimer and troponins. 2. COPD -will place patient on nebulizer treatment. 3. History of diastolic CHF last EF measured was 55-60% in December 2018 -patient clinically appears dehydrated.  We will hold Lasix for now. 4. Hypokalemia likely from Lasix use -replace potassium and recheck metabolic panel check magnesium levels. 5. History of pulmonary fibrosis on prednisone presently dose to Solu-Medrol. 6. History of polymyositis on azathioprine. 7. Diabetes mellitus type 2 on long-acting insulin 25 units in the morning along with 20 units pre-meals insulin.  Closely follow CBGs.  Patient is also on Victoza. 8. Patient appears mildly drowsy for which I have ordered ABG. 9. Hypertension on amlodipine.  Repeat lactate is pending.   DVT prophylaxis: Heparin.  Patient's allergy list says Lovenox causes bleeding I discussed with pharmacy.  Pharmacist reviewed patient's allergy list and stated to keep patient on heparin. Code Status: Full code. Family Communication: No family at the  bedside. Disposition Plan: Home. Consults called: None. Admission status: Inpatient.   Eduard Clos MD [facility] Pager [phone].  If 7PM-7AM, please contact night-coverage [email or web address] Password [facility]  09/13/2017, 6:24 AM

## 2017-09-13 NOTE — Progress Notes (Signed)
Pt. Refused to let RT stick for an ABG. MD aware.

## 2017-09-13 NOTE — Progress Notes (Addendum)
CRITICAL VALUE ALERT  Critical Value:  Lactic acid 5.0  Date & Time Notied:  09/13/17 @ 2200  Provider Notified: Brodenheimer  Orders Received/Actions taken: orders received

## 2017-09-13 NOTE — ED Notes (Signed)
Reject x1 pt moved arm twice needle came out.

## 2017-09-13 NOTE — ED Triage Notes (Signed)
Brought by ems from home for c/o SOB, n/v, chest wall pain.  Reports non productive  Cough and elevated temp.  Sat on arrival of ems reported at 79%.  Up to 98%.  Wears O2 at 5L at home.  Hx CHF, COPD.

## 2017-09-13 NOTE — Progress Notes (Signed)
CRITICAL VALUE ALERT  Critical Value:  CBG 501  Date & Time Notied:  09/13/17 @ 2214  Provider Notified: Brodenheimer   Orders Received/Actions taken: orders received

## 2017-09-13 NOTE — Progress Notes (Signed)
[facility]                                                                              Patient Demographics  Elizabeth Montes, is a 62 y.o. female, DOB - [birth year], [id number]  Admit date - 09/13/2017   Admitting Physician No admitting provider for patient encounter.  Outpatient Primary MD for the patient is Patient, No Pcp Per  Outpatient specialists:   LOS - 0  days   Medical records reviewed and are as summarized below:    Chief Complaint  Patient presents with  . Shortness of Breath       Brief summary   Elizabeth Montes is a 62 y.o. female with history of chronic respiratory failure on home oxygen, pulmonary fibrosis, COPD, CHF, polymyositis, diabetes mellitus presented to ED with shortness of breath for last 2 days with productive cough.  Also reported some pleuritic type chest pain, no fevers or chills.  In ED, patient was febrile with temp of 102 F, chest x-ray showed congestion, patient was placed on BiPAP, admitted for further workup.  Assessment & Plan    Principal Problem:   Acute respiratory failure with hypoxia (HCC) likely due to pneumonia/HCAP -Influenza panel negative, urine strep antigen negative.  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 albuterol  2.5 mg Nebulization Q4H  . amLODipine  10 mg Oral Daily  . aspirin EC  81 mg Oral Daily  . atorvastatin  80 mg Oral Daily  . azaTHIOprine  25 mg Oral Daily  . budesonide (PULMICORT) nebulizer solution  0.25 mg Nebulization BID  . fenofibrate  160 mg Oral Daily  . heparin injection (subcutaneous)  5,000 Units Subcutaneous Q8H  . insulin aspart  20 Units Subcutaneous TID WC  . insulin aspart  0-9 Units Subcutaneous TID WC  . Insulin Glargine  25 Units Subcutaneous [id number]  . ipratropium  0.5 mg Nebulization Q6H  . liraglutide  1.2 mg Subcutaneous Daily  . methylPREDNISolone (SOLU-MEDROL) injection  40 mg Intravenous Daily  . metoCLOPramide  5 mg Oral TID AC  . metoprolol tartrate  25 mg Oral BID  . potassium chloride SA  20 mEq Oral Daily  . tamsulosin  0.4 mg Oral QPC supper   Continuous Infusions: . ceFEPime (MAXIPIME) IV Stopped (09/13/17 0801)  . potassium chloride 10 mEq (09/13/17 1106)  .  vancomycin     PRN Meds:.acetaminophen **OR** acetaminophen, albuterol, bisacodyl, cyclobenzaprine, ondansetron **OR** ondansetron (ZOFRAN) IV, oxyCODONE-acetaminophen   Antibiotics   Anti-infectives (From admission, onward)   Start     Dose/Rate Route Frequency Ordered Stop   09/13/17 1800  vancomycin (VANCOCIN) IVPB 1000 mg/200 mL premix     1,000 mg 200 mL/hr over 60 Minutes Intravenous Every 12 hours 09/13/17 0635     09/13/17 0700  ceFEPIme (MAXIPIME) 1 g in sodium chloride 0.9 % 100 mL IVPB     1 g 200 mL/hr over 30 Minutes Intravenous Every 8 hours 09/13/17 0623 09/21/17 0559   09/13/17 0700  vancomycin (VANCOCIN) 1,500 mg in sodium chloride 0.9 % 500 mL IVPB     1,500 mg 250 mL/hr over 120 Minutes Intravenous  Once 09/13/17 0631 09/13/17 1040   09/13/17 0515  vancomycin (VANCOCIN) IVPB 1000 mg/200 mL premix  Status:  Discontinued      1,000 mg 200 mL/hr over 60 Minutes Intravenous  Once 09/13/17 0504 09/13/17 0631   09/13/17 0515  piperacillin-tazobactam (ZOSYN) IVPB 3.375 g  Status:  Discontinued     3.375 g 100 mL/hr over 30 Minutes Intravenous  Once 09/13/17 0504 09/13/17 0631        Subjective:   [name] was seen and examined today.  On BiPAP, difficult to obtain review of system from the patient.      Objective:   Vitals:   09/13/17 0945 09/13/17 1015 09/13/17 1030 09/13/17 1045  BP:  140/78 137/75 130/75  Pulse: 96 99 98 97  Resp: (!) 28 (!) 26 16 (!) 24  Temp:      TempSrc:      SpO2: 98% 95% 97% 98%  Weight:      Height:        Intake/Output Summary (Last 24 hours) at 09/13/2017 1134 Last data filed at 09/13/2017 0801 Gross per 24 hour  Intake 200 ml  Output -  Net 200 ml     Wt Readings from Last 3 Encounters:  09/13/17 88 kg (194 lb)  08/11/17 88.2 kg (194 lb 7.1 oz)  08/03/17 87.5 kg (192 lb 14.4 oz)     Exam  General: Somewhat drowsy but arousable, on BiPAP  Eyes:   HEENT:    Cardiovascular: S1 S2 auscultated,  Regular rate and rhythm.  Respiratory: Decreased breath sound at the baseS  Gastrointestinal: Soft, nontender, nondistended, + bowel sounds  Ext: no pedal edema bilaterally  Neuro: difficult to assess  Musculoskeletal: No digital cyanosis, clubbing  Skin: No rashes  Psych: drowsy    Data Reviewed:  I have personally reviewed following labs and imaging studies  Micro Results No results found for this or any previous visit (from the past 240 hour(s)).  Radiology Reports Dg Chest Port 1 View  Result Date: 09/13/2017 CLINICAL DATA:  CHF.  Shortness of breath.  Chest pain.  Cough. EXAM: PORTABLE CHEST 1 VIEW COMPARISON:  Frontal and lateral views 08/11/2017 FINDINGS: Tip of the left upper extremity PICC in the proximal SVC. Heart is upper normal in size. Mediastinal contours are unchanged. Increased interstitial opacities above baseline suspicious  for pulmonary edema. Streaky bibasilar opacities are likely atelectasis. No large pleural effusion. No pneumothorax. IMPRESSION: 1. Mild pulmonary edema. 2. Increased bibasilar atelectasis. 3. Tip of the left upper extremity PICC in the proximal SVC. Electronically Signed   By: Rubye Oaks M.D.   On: 09/13/2017 02:46    Lab Data:  CBC: Recent Labs  Lab  09/13/17 0345  WBC 18.5*  NEUTROABS 14.2*  HGB 13.0  HCT 40.5  MCV 91.2  PLT 497*   Basic Metabolic Panel: Recent Labs  Lab 09/13/17 0345  NA 140  K 2.6*  CL 102  CO2 23  GLUCOSE 160*  BUN 10  CREATININE 1.04*  CALCIUM 9.0  MG 1.5*   GFR: Estimated Creatinine Clearance: 61.2 mL/min (A) (by C-G formula based on SCr of 1.04 mg/dL (H)). Liver Function Tests: Recent Labs  Lab 09/13/17 0345  AST 61*  ALT 72*  ALKPHOS 91  BILITOT 0.5  PROT 6.4*  ALBUMIN 3.0*   Recent Labs  Lab 09/13/17 0345  LIPASE 34   No results for input(s): AMMONIA in the last 168 hours. Coagulation Profile: No results for input(s): INR, PROTIME in the last 168 hours. Cardiac Enzymes: Recent Labs  Lab 09/13/17 0345  TROPONINI <0.03   BNP (last 3 results) No results for input(s): PROBNP in the last 8760 hours. HbA1C: No results for input(s): HGBA1C in the last 72 hours. CBG: Recent Labs  Lab 09/13/17 0738  GLUCAP 149*   Lipid Profile: No results for input(s): CHOL, HDL, LDLCALC, TRIG, CHOLHDL, LDLDIRECT in the last 72 hours. Thyroid Function Tests: No results for input(s): TSH, T4TOTAL, FREET4, T3FREE, THYROIDAB in the last 72 hours. Anemia Panel: No results for input(s): VITAMINB12, FOLATE, FERRITIN, TIBC, IRON, RETICCTPCT in the last 72 hours. Urine analysis:    Component Value Date/Time   COLORURINE YELLOW 07/16/2017 0526   APPEARANCEUR CLEAR 07/16/2017 0526   LABSPEC 1.010 07/16/2017 0526   PHURINE 5.0 07/16/2017 0526   GLUCOSEU >=500 (A) 07/16/2017 0526   HGBUR NEGATIVE 07/16/2017 0526   BILIRUBINUR NEGATIVE  07/16/2017 0526   KETONESUR NEGATIVE 07/16/2017 0526   PROTEINUR NEGATIVE 07/16/2017 0526   NITRITE NEGATIVE 07/16/2017 0526   LEUKOCYTESUR NEGATIVE 07/16/2017 0526     Raydell Maners M.D. [facility] 09/13/2017, 11:34 AM  Pager: [phone] Between 7am to 7pm - call Pager - [phone]  After 7pm go to [email or web address] - password [email or web address]  Call night coverage person covering after 7pm

## 2017-09-13 NOTE — ED Notes (Signed)
Repositioned for comfort.  Purewick placed at this time.  Encouraged to call for assistance as needed.

## 2017-09-14 LAB — BASIC METABOLIC PANEL
ANION GAP: 12 (ref 5–15)
BUN: 15 mg/dL (ref 6–20)
CALCIUM: 7.9 mg/dL — AB (ref 8.9–10.3)
CHLORIDE: 101 mmol/L (ref 101–111)
CO2: 23 mmol/L (ref 22–32)
CREATININE: 1 mg/dL (ref 0.44–1.00)
GFR calc non Af Amer: >60 mL/min (ref >60)
Glucose, Bld: 381 mg/dL — ABNORMAL HIGH (ref 65–99)
Potassium: 4.1 mmol/L (ref 3.5–5.1)
SODIUM: 136 mmol/L (ref 135–145)

## 2017-09-14 LAB — GLUCOSE, CAPILLARY
GLUCOSE-CAPILLARY: 327 mg/dL — AB (ref 65–99)
GLUCOSE-CAPILLARY: 211 mg/dL — AB (ref 65–99)
GLUCOSE-CAPILLARY: 333 mg/dL — AB (ref 65–99)
Glucose-Capillary: 336 mg/dL — ABNORMAL HIGH (ref 65–99)

## 2017-09-14 LAB — LEGIONELLA PNEUMOPHILA SEROGP 1 UR AG: L. pneumophila Serogp 1 Ur Ag: NEGATIVE

## 2017-09-14 LAB — CBC
HCT: 35.9 % — ABNORMAL LOW (ref 36.0–46.0)
Hemoglobin: 11.1 g/dL — ABNORMAL LOW (ref 12.0–15.0)
MCH: 29.1 pg (ref 26.0–34.0)
MCHC: 30.9 g/dL (ref 30.0–36.0)
MCV: 94.2 fL (ref 78.0–100.0)
Platelets: 472 10*3/uL — ABNORMAL HIGH (ref 150–400)
RBC: 3.81 MIL/uL — AB (ref 3.87–5.11)
RDW: 15.9 % — AB (ref 11.5–15.5)
WBC: 19.9 10*3/uL — ABNORMAL HIGH (ref 4.0–10.5)

## 2017-09-14 LAB — HIV ANTIBODY (ROUTINE TESTING W REFLEX): HIV Screen 4th Generation wRfx: NONREACTIVE

## 2017-09-14 LAB — MRSA PCR SCREENING: MRSA BY PCR: NEGATIVE

## 2017-09-14 MED ORDER — PREDNISONE 20 MG PO TABS
40.0000 mg | ORAL_TABLET | Freq: Every day | ORAL | Status: DC
  Administered 2017-09-15 – 2017-09-17 (×3): 40 mg via ORAL
  Filled 2017-09-14 (×4): qty 2

## 2017-09-14 MED ORDER — FUROSEMIDE 80 MG PO TABS
80.0000 mg | ORAL_TABLET | Freq: Every day | ORAL | Status: DC
  Administered 2017-09-14 – 2017-09-17 (×4): 80 mg via ORAL
  Filled 2017-09-14 (×4): qty 1

## 2017-09-14 MED ORDER — INSULIN GLARGINE 100 UNIT/ML ~~LOC~~ SOLN
25.0000 [IU] | Freq: Every day | SUBCUTANEOUS | Status: DC
  Administered 2017-09-14 – 2017-09-16 (×3): 25 [IU] via SUBCUTANEOUS
  Filled 2017-09-14 (×4): qty 0.25

## 2017-09-14 MED ORDER — DEXTROSE 50 % IV SOLN
INTRAVENOUS | Status: AC
  Filled 2017-09-14: qty 50

## 2017-09-14 NOTE — Progress Notes (Signed)
[facility]                                                                              Patient Demographics  Elizabeth Montes, is a 62 y.o. female, DOB - 1956-03-31, [id number]  Admit date - 09/13/2017   Admitting Physician Eduard Clos, MD  Outpatient Primary MD for the patient is Patient, No Pcp Per  Outpatient specialists:   LOS - 1  days   Medical records reviewed and are as summarized below:    Chief Complaint  Patient presents with  . Shortness of Breath       Brief summary   Elizabeth Montes is a 62 y.o. female with history of chronic respiratory failure on home oxygen, pulmonary fibrosis, COPD, CHF, polymyositis, diabetes mellitus presented to ED with shortness of breath for last 2 days with productive cough.  Also reported some pleuritic type chest pain, no fevers or chills.  In ED, patient was febrile with temp of 102 F, chest x-ray showed congestion, patient was placed on BiPAP, admitted for further workup.  Assessment & Plan    Principal Problem:   Acute respiratory failure with hypoxia (HCC) likely due to pneumonia/HCAP -Influenza panel negative, urine strep antigen negative.  Urine Legionella antigen negative -Blood cultures negative till date  -Continue IV vancomycin, cefepime -Off BiPAP, O2 sats 96% on 5 L   Active Problems: COPD exacerbation -Slowly improving, continue duo nebs, Pulmicort -DC IV Solu-Medrol, transition to oral prednisone a.m.    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 -Increase Lantus to 25 units qhs, continue NovoLog meal coverage, sliding scale insulin   Code Status: Full CODE STATUS DVT  Prophylaxis: heparin  Family Communication: Discussed in detail with the patient, all imaging results, lab results explained to the patient   Disposition Plan: DC home when medically stable  Time Spent in minutes  35 minutes  Procedures:  Bipap  Consultants:   None   Antimicrobials:   See below    Medications  Scheduled Meds: . albuterol  2.5 mg Nebulization TID  . amLODipine  10 mg Oral Daily  . aspirin EC  81 mg Oral Daily  . azaTHIOprine  25 mg Oral Daily  . budesonide (PULMICORT) nebulizer solution  0.25 mg Nebulization BID  . fenofibrate  160 mg Oral Daily  . heparin injection (subcutaneous)  5,000 Units Subcutaneous Q8H  . insulin aspart  0-15 Units Subcutaneous TID WC  . insulin aspart  0-5 Units Subcutaneous QHS  . insulin aspart  20 Units Subcutaneous TID WC  . insulin glargine  25 Units Subcutaneous QHS  . ipratropium  0.5 mg Nebulization TID  . metoCLOPramide  5 mg Oral TID AC  . metoprolol tartrate  25 mg Oral BID  . [START ON 09/15/2017] predniSONE  40 mg Oral Q breakfast   Continuous  Infusions: . ceFEPime (MAXIPIME) IV Stopped (09/14/17 1352)  . vancomycin Stopped (09/14/17 0640)   PRN Meds:.acetaminophen **OR** acetaminophen, albuterol, bisacodyl, ondansetron **OR** ondansetron (ZOFRAN) IV, oxyCODONE-acetaminophen   Antibiotics   Anti-infectives (From admission, onward)   Start     Dose/Rate Route Frequency Ordered Stop   09/13/17 1800  vancomycin (VANCOCIN) IVPB 1000 mg/200 mL premix     1,000 mg 200 mL/hr over 60 Minutes Intravenous Every 12 hours 09/13/17 0635     09/13/17 0700  ceFEPIme (MAXIPIME) 1 g in sodium chloride 0.9 % 100 mL IVPB     1 g 200 mL/hr over 30 Minutes Intravenous Every 8 hours 09/13/17 0623 09/21/17 0559   09/13/17 0700  vancomycin (VANCOCIN) 1,500 mg in sodium chloride 0.9 % 500 mL IVPB     1,500 mg 250 mL/hr over 120 Minutes Intravenous  Once 09/13/17 0631 09/13/17 1500   09/13/17 0515  vancomycin (VANCOCIN) IVPB 1000  mg/200 mL premix  Status:  Discontinued     1,000 mg 200 mL/hr over 60 Minutes Intravenous  Once 09/13/17 0504 09/13/17 0631   09/13/17 0515  piperacillin-tazobactam (ZOSYN) IVPB 3.375 g  Status:  Discontinued     3.375 g 100 mL/hr over 30 Minutes Intravenous  Once 09/13/17 0504 09/13/17 0631        Subjective:   Elizabeth Montes was seen and examined today.  BiPAP weaned off, feeling somewhat better, wheezing is improving.  No chest pain, nausea, vomiting, dizziness.  No coughing.  No fevers or chills.  Objective:   Vitals:   09/14/17 0830 09/14/17 1155 09/14/17 1504 09/14/17 1618  BP: 128/71 117/64  103/64  Pulse: 84 79  90  Resp:  (!) 23  (!) 21  Temp:  98.2 F (36.8 C)  98.5 F (36.9 C)  TempSrc:  Oral  Oral  SpO2:  97% 97% 96%  Weight:      Height:        Intake/Output Summary (Last 24 hours) at 09/14/2017 1627 Last data filed at 09/14/2017 1330 Gross per 24 hour  Intake 1660.8 ml  Output 250 ml  Net 1410.8 ml     Wt Readings from Last 3 Encounters:  09/14/17 86.8 kg (191 lb 5.8 oz)  08/11/17 88.2 kg (194 lb 7.1 oz)  08/03/17 87.5 kg (192 lb 14.4 oz)     Exam   General: Alert and oriented x 3, NAD  Eyes:   HEENT:  Atraumatic, normocephalic  Cardiovascular: S1 S2 auscultated,Regular rate and rhythm. Trace pedal edema b/l  Respiratory: Bilateral scattered wheezing  Gastrointestinal: Soft, nontender, nondistended, + bowel sounds  Ext: trace pedal edema bilaterally  Neuro: no new deficits  Musculoskeletal: No digital cyanosis, clubbing  Skin: No rashes  Psych: Normal affect and demeanor, alert and oriented x3    Data Reviewed:  I have personally reviewed following labs and imaging studies  Micro Results Recent Results (from the past 240 hour(s))  MRSA PCR Screening     Status: None   Collection Time: 09/13/17  8:48 PM  Result Value Ref Range Status   MRSA by PCR NEGATIVE NEGATIVE Final    Comment:        The GeneXpert MRSA Assay  (FDA approved for NASAL specimens only), is one component of a comprehensive MRSA colonization surveillance program. It is not intended to diagnose MRSA infection nor to guide or monitor treatment for MRSA infections. Performed at [facility], [address] [place], [place] [address]     Radiology  Reports Dg Chest Port 1 View  Result Date: 09/13/2017 CLINICAL DATA:  CHF.  Shortness of breath.  Chest pain.  Cough. EXAM: PORTABLE CHEST 1 VIEW COMPARISON:  Frontal and lateral views 08/11/2017 FINDINGS: Tip of the left upper extremity PICC in the proximal SVC. Heart is upper normal in size. Mediastinal contours are unchanged. Increased interstitial opacities above baseline suspicious for pulmonary edema. Streaky bibasilar opacities are likely atelectasis. No large pleural effusion. No pneumothorax. IMPRESSION: 1. Mild pulmonary edema. 2. Increased bibasilar atelectasis. 3. Tip of the left upper extremity PICC in the proximal SVC. Electronically Signed   By: Rubye Oaks M.D.   On: 09/13/2017 02:46    Lab Data:  CBC: Recent Labs  Lab 09/13/17 0345 09/14/17 0613  WBC 18.5* 19.9*  NEUTROABS 14.2*  --   HGB 13.0 11.1*  HCT 40.5 35.9*  MCV 91.2 94.2  PLT 497* 472*   Basic Metabolic Panel: Recent Labs  Lab 09/13/17 0345 09/13/17 2054 09/14/17 0613  NA 140  --  136  K 2.6* 4.0 4.1  CL 102  --  101  CO2 23  --  23  GLUCOSE 160*  --  381*  BUN 10  --  15  CREATININE 1.04*  --  1.00  CALCIUM 9.0  --  7.9*  MG 1.5*  --   --    GFR: Estimated Creatinine Clearance: 63.3 mL/min (by C-G formula based on SCr of 1 mg/dL). Liver Function Tests: Recent Labs  Lab 09/13/17 0345  AST 61*  ALT 72*  ALKPHOS 91  BILITOT 0.5  PROT 6.4*  ALBUMIN 3.0*   Recent Labs  Lab 09/13/17 0345  LIPASE 34   No results for input(s): AMMONIA in the last 168 hours. Coagulation Profile: No results for input(s): INR, PROTIME in the last 168 hours. Cardiac Enzymes: Recent Labs   Lab 09/13/17 0345  TROPONINI <0.03   BNP (last 3 results) No results for input(s): PROBNP in the last 8760 hours. HbA1C: Recent Labs    09/13/17 2054  HGBA1C 8.5*   CBG: Recent Labs  Lab 09/13/17 1213 09/13/17 1637 09/13/17 2206 09/14/17 0755 09/14/17 1126  GLUCAP 238* 459* 501* 327* 211*   Lipid Profile: No results for input(s): CHOL, HDL, LDLCALC, TRIG, CHOLHDL, LDLDIRECT in the last 72 hours. Thyroid Function Tests: No results for input(s): TSH, T4TOTAL, FREET4, T3FREE, THYROIDAB in the last 72 hours. Anemia Panel: No results for input(s): VITAMINB12, FOLATE, FERRITIN, TIBC, IRON, RETICCTPCT in the last 72 hours. Urine analysis:    Component Value Date/Time   COLORURINE YELLOW 07/16/2017 0526   APPEARANCEUR CLEAR 07/16/2017 0526   LABSPEC 1.010 07/16/2017 0526   PHURINE 5.0 07/16/2017 0526   GLUCOSEU >=500 (A) 07/16/2017 0526   HGBUR NEGATIVE 07/16/2017 0526   BILIRUBINUR NEGATIVE 07/16/2017 0526   KETONESUR NEGATIVE 07/16/2017 0526   PROTEINUR NEGATIVE 07/16/2017 0526   NITRITE NEGATIVE 07/16/2017 0526   LEUKOCYTESUR NEGATIVE 07/16/2017 0526     Ellese Julius M.D. [facility] 09/14/2017, 4:27 PM  Pager: [phone] Between 7am to 7pm - call Pager - [phone]  After 7pm go to [email or web address] - password [email or web address]  Call night coverage person covering after 7pm

## 2017-09-14 NOTE — Progress Notes (Deleted)
2 episodes of hypoglycemia <10. D50 administered per protocol each event.

## 2017-09-14 NOTE — Progress Notes (Signed)
Inpatient Diabetes Program Recommendations  AACE/ADA: New Consensus Statement on Inpatient Glycemic Control (2015)  Target Ranges:  Prepandial:   less than 140 mg/dL      Peak postprandial:   less than 180 mg/dL (1-2 hours)      Critically ill patients:  140 - 180 mg/dL   Lab Results  Component Value Date   GLUCAP 327 (H) 09/14/2017   HGBA1C 8.5 (H) 09/13/2017   Review of Glycemic Control  Diabetes history: DM 2 Outpatient Diabetes medications: Toujeo 25 units, Novolog 20 units tid, Victoza 1.2 mg Daily Current orders for Inpatient glycemic control: Lantus 15 units, Novolog Moderate Correction 0-15 units tid + Novolog HS scale 0-5 units, Novolog 20 units tid meal coverage  Inpatient Diabetes Program Recommendations:    Patient on IV Solumedrol 40 mg Daily, Glucose in the 300's this am. Patient takes more basal insulin at home. Please consider increasing Lantus to 25 units.  A1c 8.5% on 2/18  Thanks,  Christena Deem RN, MSN, [facility] Inpatient Diabetes Coordinator Team Pager [phone] (8a-5p)

## 2017-09-15 DIAGNOSIS — J9601 Acute respiratory failure with hypoxia: Secondary | ICD-10-CM

## 2017-09-15 DIAGNOSIS — M3320 Polymyositis, organ involvement unspecified: Secondary | ICD-10-CM

## 2017-09-15 DIAGNOSIS — E119 Type 2 diabetes mellitus without complications: Secondary | ICD-10-CM

## 2017-09-15 DIAGNOSIS — I10 Essential (primary) hypertension: Secondary | ICD-10-CM

## 2017-09-15 DIAGNOSIS — J189 Pneumonia, unspecified organism: Principal | ICD-10-CM

## 2017-09-15 DIAGNOSIS — R509 Fever, unspecified: Secondary | ICD-10-CM

## 2017-09-15 DIAGNOSIS — I5032 Chronic diastolic (congestive) heart failure: Secondary | ICD-10-CM

## 2017-09-15 LAB — GLUCOSE, CAPILLARY
GLUCOSE-CAPILLARY: 247 mg/dL — AB (ref 65–99)
GLUCOSE-CAPILLARY: 202 mg/dL — AB (ref 65–99)
GLUCOSE-CAPILLARY: 262 mg/dL — AB (ref 65–99)
Glucose-Capillary: 252 mg/dL — ABNORMAL HIGH (ref 65–99)

## 2017-09-15 LAB — BASIC METABOLIC PANEL
ANION GAP: 10 (ref 5–15)
BUN: 18 mg/dL (ref 6–20)
CALCIUM: 7.6 mg/dL — AB (ref 8.9–10.3)
CHLORIDE: 105 mmol/L (ref 101–111)
CO2: 22 mmol/L (ref 22–32)
Creatinine, Ser: 0.94 mg/dL (ref 0.44–1.00)
GFR calc non Af Amer: >60 mL/min (ref >60)
Glucose, Bld: 254 mg/dL — ABNORMAL HIGH (ref 65–99)
Potassium: 3.7 mmol/L (ref 3.5–5.1)
SODIUM: 137 mmol/L (ref 135–145)

## 2017-09-15 LAB — CBC
HCT: 34.7 % — ABNORMAL LOW (ref 36.0–46.0)
HEMOGLOBIN: 10.5 g/dL — AB (ref 12.0–15.0)
MCH: 28.3 pg (ref 26.0–34.0)
MCHC: 30.3 g/dL (ref 30.0–36.0)
MCV: 93.5 fL (ref 78.0–100.0)
Platelets: 468 10*3/uL — ABNORMAL HIGH (ref 150–400)
RBC: 3.71 MIL/uL — ABNORMAL LOW (ref 3.87–5.11)
RDW: 15.5 % (ref 11.5–15.5)
WBC: 17.4 10*3/uL — AB (ref 4.0–10.5)

## 2017-09-15 MED ORDER — SODIUM CHLORIDE 3 % IN NEBU
4.0000 mL | INHALATION_SOLUTION | Freq: Two times a day (BID) | RESPIRATORY_TRACT | Status: DC
  Administered 2017-09-15 – 2017-09-17 (×4): 4 mL via RESPIRATORY_TRACT
  Filled 2017-09-15 (×5): qty 4

## 2017-09-15 MED ORDER — IPRATROPIUM-ALBUTEROL 0.5-2.5 (3) MG/3ML IN SOLN
3.0000 mL | Freq: Three times a day (TID) | RESPIRATORY_TRACT | Status: DC
  Administered 2017-09-15: 3 mL via RESPIRATORY_TRACT
  Filled 2017-09-15: qty 3

## 2017-09-15 MED ORDER — IPRATROPIUM-ALBUTEROL 0.5-2.5 (3) MG/3ML IN SOLN
3.0000 mL | Freq: Four times a day (QID) | RESPIRATORY_TRACT | Status: DC
  Administered 2017-09-15 – 2017-09-17 (×6): 3 mL via RESPIRATORY_TRACT
  Filled 2017-09-15 (×7): qty 3

## 2017-09-15 NOTE — Progress Notes (Signed)
Pharmacy Antibiotic Note  Elizabeth Montes is a 62 y.o. female admitted on 09/13/2017 with SOB/possible PNA.  Pharmacy has been consulted for Vancomycin dosing.  Day # 3 of Vancomycin / Cefepime MRSA PCR - Cultures negative Afebrile  Plan: Consider stopping vancomycin Cefepime 1 gram iv Q 8 hours  Height: 5\' 3"  (160 cm) Weight: 193 lb (87.5 kg) IBW/kg (Calculated) : 52.4  Temp (24hrs), Avg:98 F (36.7 C), Min:97.6 F (36.4 C), Max:98.5 F (36.9 C)  Recent Labs  Lab 09/13/17 0345 09/13/17 2053 09/14/17 0613 09/15/17 0500  WBC 18.5*  --  19.9* 17.4*  CREATININE 1.04*  --  1.00 0.94  LATICACIDVEN 2.2* 5.0*  --   --     Estimated Creatinine Clearance: 67.5 mL/min (by C-G formula based on SCr of 0.94 mg/dL).    Allergies  Allergen Reactions  . Lovenox [Enoxaparin Sodium] Other (See Comments)    "made me bleed"   Elwin Sleight 09/15/2017 1:48 PM

## 2017-09-15 NOTE — Progress Notes (Signed)
PROGRESS NOTE  Elizabeth Montes ZOX:[id number] DOB: 19-Aug-1955 DOA: 09/13/2017 PCP: Patient, No Pcp Per  HPI/Recap of past 24 hours: Elizabeth Montes a 62 y.o.femalewithhistory of chronic respiratory failure on home oxygen, pulmonary fibrosis, COPD, CHF, polymyositis, diabetes mellitus presented to ED with shortness of breath for last 2 days with productive cough.  Also reported some pleuritic type chest pain, no fevers or chills.  In ED, patient was febrile with temp of 102 F, chest x-ray showed congestion, patient was placed on BiPAP, admitted for further workup.  09/15/17: seen and examined at her bedside. Reports persistent dyspnea. On supplemental O2 and breathing treatments.  Assessment/Plan: Principal Problem:   Acute respiratory failure with hypoxia (HCC) Active Problems:   Hypertension   Polymyositis (HCC)   Healthcare-associated pneumonia   Chronic diastolic CHF (congestive heart failure) (HCC)   Diabetes mellitus type 2 in nonobese (HCC)   Acute respiratory failure (HCC)  Acute hypoxic respiratory failure 2/2 to HCAP, acute COPD exacerbation -influenza panel neagtive -continue IV vancomycin and IV cefepime -now off BIPAP -continue O2 supplementation to maintain O2 sat 92% or greater -continue nebs -continue continuous pulse ox monitoring  Acute COPD exacerbation -management as stated above -In addition, continue pulmicort, po steroids -started hypersaline nebs and chest PT  HTN -stable -continue amlodipine, lopressor  Chronic diastolic heart failure -2D echo LVEF 55-60% (06/2017) -continue lasix  DM2 -continue lantus, novolog, ISS -A1C 8.5 (09/13/17)  Polymyositis -continue azathioprine  Code Status: full  Family Communication: none at bedside  Disposition Plan: to be determined   Consultants:  none   Procedures:  none  Antimicrobials:  IV vancomycin and IV cefepime  DVT prophylaxis:  sq heparin   Objective: Vitals:   09/15/17 1144  09/15/17 1509 09/15/17 1514 09/15/17 1617  BP: 135/77   138/64  Pulse: 84   93  Resp: 19   (!) 27  Temp: 98.1 F (36.7 C)   98.5 F (36.9 C)  TempSrc: Oral   Oral  SpO2: 92% 96% 96% 95%  Weight:      Height:        Intake/Output Summary (Last 24 hours) at 09/15/2017 1912 Last data filed at 09/15/2017 1854 Gross per 24 hour  Intake 2140 ml  Output 2700 ml  Net -560 ml   Filed Weights   09/13/17 2047 09/14/17 0536 09/15/17 0455  Weight: 86.4 kg (190 lb 7.6 oz) 86.8 kg (191 lb 5.8 oz) 87.5 kg (193 lb)    Exam:   General: 62 yo F WD WN NAD A&O x 3  Cardiovascular: RRR no rubs or gallops   Respiratory: diffused rales bilaterally. No wheezes  Abdomen: soft obese NT Nd NBS x4  Musculoskeletal: no focal abnormalities   Skin: no rash  Psychiatry: mood is appropriate for condition and setting    Data Reviewed: CBC: Recent Labs  Lab 09/13/17 0345 09/14/17 0613 09/15/17 0500  WBC 18.5* 19.9* 17.4*  NEUTROABS 14.2*  --   --   HGB 13.0 11.1* 10.5*  HCT 40.5 35.9* 34.7*  MCV 91.2 94.2 93.5  PLT 497* 472* 468*   Basic Metabolic Panel: Recent Labs  Lab 09/13/17 0345 09/13/17 2054 09/14/17 0613 09/15/17 0500  NA 140  --  136 137  K 2.6* 4.0 4.1 3.7  CL 102  --  101 105  CO2 23  --  23 22  GLUCOSE 160*  --  381* 254*  BUN 10  --  15 18  CREATININE 1.04*  --  1.00 0.94  CALCIUM 9.0  --  7.9* 7.6*  MG 1.5*  --   --   --    GFR: Estimated Creatinine Clearance: 67.5 mL/min (by C-G formula based on SCr of 0.94 mg/dL). Liver Function Tests: Recent Labs  Lab 09/13/17 0345  AST 61*  ALT 72*  ALKPHOS 91  BILITOT 0.5  PROT 6.4*  ALBUMIN 3.0*   Recent Labs  Lab 09/13/17 0345  LIPASE 34   No results for input(s): AMMONIA in the last 168 hours. Coagulation Profile: No results for input(s): INR, PROTIME in the last 168 hours. Cardiac Enzymes: Recent Labs  Lab 09/13/17 0345  TROPONINI <0.03   BNP (last 3 results) No results for input(s): PROBNP in the  last 8760 hours. HbA1C: Recent Labs    09/13/17 2054  HGBA1C 8.5*   CBG: Recent Labs  Lab 09/14/17 1804 09/14/17 2046 09/15/17 0737 09/15/17 1132 09/15/17 1615  GLUCAP 333* 336* 247* 202* 262*   Lipid Profile: No results for input(s): CHOL, HDL, LDLCALC, TRIG, CHOLHDL, LDLDIRECT in the last 72 hours. Thyroid Function Tests: No results for input(s): TSH, T4TOTAL, FREET4, T3FREE, THYROIDAB in the last 72 hours. Anemia Panel: No results for input(s): VITAMINB12, FOLATE, FERRITIN, TIBC, IRON, RETICCTPCT in the last 72 hours. Urine analysis:    Component Value Date/Time   COLORURINE YELLOW 07/16/2017 0526   APPEARANCEUR CLEAR 07/16/2017 0526   LABSPEC 1.010 07/16/2017 0526   PHURINE 5.0 07/16/2017 0526   GLUCOSEU >=500 (A) 07/16/2017 0526   HGBUR NEGATIVE 07/16/2017 0526   BILIRUBINUR NEGATIVE 07/16/2017 0526   KETONESUR NEGATIVE 07/16/2017 0526   PROTEINUR NEGATIVE 07/16/2017 0526   NITRITE NEGATIVE 07/16/2017 0526   LEUKOCYTESUR NEGATIVE 07/16/2017 0526   Sepsis Labs: @LABRCNTIP (procalcitonin:4,lacticidven:4)  ) Recent Results (from the past 240 hour(s))  MRSA PCR Screening     Status: None   Collection Time: 09/13/17  8:48 PM  Result Value Ref Range Status   MRSA by PCR NEGATIVE NEGATIVE Final    Comment:        The GeneXpert MRSA Assay (FDA approved for NASAL specimens only), is one component of a comprehensive MRSA colonization surveillance program. It is not intended to diagnose MRSA infection nor to guide or monitor treatment for MRSA infections. Performed at [facility], [address], [place], [place] [address]       Studies: No results found.  Scheduled Meds: . amLODipine  10 mg Oral Daily  . aspirin EC  81 mg Oral Daily  . azaTHIOprine  25 mg Oral Daily  . budesonide (PULMICORT) nebulizer solution  0.25 mg Nebulization BID  . fenofibrate  160 mg Oral Daily  . furosemide  80 mg Oral Daily  . heparin injection (subcutaneous)  5,000  Units Subcutaneous Q8H  . insulin aspart  0-15 Units Subcutaneous TID WC  . insulin aspart  0-5 Units Subcutaneous QHS  . insulin aspart  20 Units Subcutaneous TID WC  . insulin glargine  25 Units Subcutaneous QHS  . ipratropium-albuterol  3 mL Nebulization Q6H  . metoCLOPramide  5 mg Oral TID AC  . metoprolol tartrate  25 mg Oral BID  . predniSONE  40 mg Oral Q breakfast  . sodium chloride HYPERTONIC  4 mL Nebulization BID    Continuous Infusions: . ceFEPime (MAXIPIME) IV Stopped (09/15/17 1438)  . vancomycin Stopped (09/15/17 1854)     LOS: 2 days     Darlin Drop, MD [facility] Pager [phone]  If 7PM-7AM, please contact  night-coverage [email or web address] Password [facility] 09/15/2017, 7:12 PM

## 2017-09-15 NOTE — Evaluation (Signed)
Physical Therapy Evaluation Patient Details Name: Elizabeth Montes MRN: [id number] DOB: 13-Jan-1956 Today's Date: 09/15/2017   History of Present Illness  Elizabeth Montes is a 62 y.o. female with history of chronic respiratory failure on home oxygen, pulmonary fibrosis, COPD, CHF, polymyositis, diabetes mellitus presents to the ER with complaints of shortness of breath.  Patient states she has been short of breath last 2 days with productive cough with pleuritic pain.  Clinical Impression  Pt admitted with/for SOB due to suspected PNA.  Pt's sats unable to stay above 90% on 4L during ambulation, but would rutern to above 90% on 4L with rest..  Pt currently limited functionally due to the problems listed below.  (see problems list.)  Pt will benefit from PT to maximize function and safety to be able to get home safely with available assist.     Follow Up Recommendations No PT follow up    Equipment Recommendations       Recommendations for Other Services       Precautions / Restrictions Precautions Precautions: None      Mobility  Bed Mobility Overal bed mobility: Modified Independent                Transfers Overall transfer level: Modified independent                  Ambulation/Gait Ambulation/Gait assistance: Supervision Ambulation Distance (Feet): 150 Feet(with short rest to recover) Assistive device: None Gait Pattern/deviations: Step-through pattern     General Gait Details: steady gait.  On 4 L sats dropped to 79/81% with HR 100's on 6L sat rose to low 90's, but still dropped ot mid 80's during gait.  Pt reported that she was relatively asymptomatic  Information systems manager Rankin (Stroke Patients Only)       Balance Overall balance assessment: Needs assistance   Sitting balance-Leahy Scale: Normal       Standing balance-Leahy Scale: Good                               Pertinent Vitals/Pain Pain  Assessment: No/denies pain    Home Living Family/patient expects to be discharged to:: Private residence Living Arrangements: Children Available Help at Discharge: Family;Available PRN/intermittently Type of Home: Apartment Home Access: Level entry     Home Layout: Two level;Able to live on main level with bedroom/bathroom;Bed/bath upstairs Home Equipment: Bedside commode Additional Comments: pt has hospital bed on the main floor, BSC on main floor and Bath on the 2nd floor. pt say she goes upstairs about 3 times per day.    Prior Function Level of Independence: Independent               Hand Dominance        Extremity/Trunk Assessment   Upper Extremity Assessment Upper Extremity Assessment: Overall WFL for tasks assessed    Lower Extremity Assessment Lower Extremity Assessment: Overall WFL for tasks assessed       Communication   Communication: No difficulties  Cognition Arousal/Alertness: Awake/alert Behavior During Therapy: WFL for tasks assessed/performed;Flat affect Overall Cognitive Status: Within Functional Limits for tasks assessed                                        General  Comments      Exercises     Assessment/Plan    PT Assessment Patient needs continued PT services  PT Problem List Decreased activity tolerance;Cardiopulmonary status limiting activity       PT Treatment Interventions Functional mobility training;Therapeutic activities;Cognitive remediation    PT Goals (Current goals can be found in the Care Plan section)  Acute Rehab PT Goals Patient Stated Goal: back to doing for myself PT Goal Formulation: With patient Time For Goal Achievement: 09/22/17 Potential to Achieve Goals: Fair    Frequency Min 3X/week   Barriers to discharge        Co-evaluation               AM-PAC PT "6 Clicks" Daily Activity  Outcome Measure Difficulty turning over in bed (including adjusting bedclothes, sheets and  blankets)?: None Difficulty moving from lying on back to sitting on the side of the bed? : None Difficulty sitting down on and standing up from a chair with arms (e.g., wheelchair, bedside commode, etc,.)?: None Help needed moving to and from a bed to chair (including a wheelchair)?: None Help needed walking in hospital room?: None Help needed climbing 3-5 steps with a railing? : A Little 6 Click Score: 23    End of Session   Activity Tolerance: Patient tolerated treatment well;Other (comment)(visible fatigue, but pt stated this was close to baseline) Patient left: in bed;with call bell/phone within reach;with bed alarm set Nurse Communication: Mobility status PT Visit Diagnosis: Difficulty in walking, not elsewhere classified (R26.2)    Time: 8032-1224 PT Time Calculation (min) (ACUTE ONLY): 22 min   Charges:   PT Evaluation $PT Eval Moderate Complexity: 1 Mod     PT G Codes:        September 27, 2017  Wamsutter Bing, PT [phone]  (pager)  Eliseo Gum Jacques Willingham 09-27-2017, 2:47 PM

## 2017-09-15 NOTE — Progress Notes (Signed)
Inpatient Diabetes Program Recommendations  AACE/ADA: New Consensus Statement on Inpatient Glycemic Control (2015)  Target Ranges:  Prepandial:   less than 140 mg/dL      Peak postprandial:   less than 180 mg/dL (1-2 hours)      Critically ill patients:  140 - 180 mg/dL   Results for Elizabeth Montes, Elizabeth Montes (MRN [id number]) as of 09/15/2017 12:23  Ref. Range 09/14/2017 07:55 09/14/2017 11:26 09/14/2017 18:04 09/14/2017 20:46 09/15/2017 07:37 09/15/2017 11:32  Glucose-Capillary Latest Ref Range: 65 - 99 mg/dL 505 (H) 697 (H) 948 (H) 336 (H) 247 (H) 202 (H)   Review of Glycemic Control  Diabetes history: DM2 Outpatient Diabetes medications: Toujeo 25 units QAM, Victoza 1.2 mg daily, Novolog 20 units TID with meals Current orders for Inpatient glycemic control: Lantus 25 units QHS, Novolog 0-15 units TID with meals, Novolog 0-5 units QHS, Novolog 20 units TID with meals for meal coverage  Inpatient Diabetes Program Recommendations: Insulin - Basal: Noted steroids decreased and changed to PO Prednisone. Please consider increasing Lantus to 28 units QHS. Insulin - Meal Coverage: Noted steroids decreased and changed to PO Prednisone. Please consider increasing meal coverage to Novolog 23 units TID with meals.  Thanks, Orlando Penner, RN, MSN, CDE Diabetes Coordinator Inpatient Diabetes Program [phone] (Team Pager from 8am to 5pm)

## 2017-09-16 DIAGNOSIS — J9600 Acute respiratory failure, unspecified whether with hypoxia or hypercapnia: Secondary | ICD-10-CM

## 2017-09-16 LAB — BASIC METABOLIC PANEL
Anion gap: 9 (ref 5–15)
BUN: 17 mg/dL (ref 6–20)
CALCIUM: 7.8 mg/dL — AB (ref 8.9–10.3)
CHLORIDE: 104 mmol/L (ref 101–111)
CO2: 25 mmol/L (ref 22–32)
CREATININE: 0.86 mg/dL (ref 0.44–1.00)
GFR calc non Af Amer: >60 mL/min (ref >60)
GLUCOSE: 174 mg/dL — AB (ref 65–99)
Potassium: 3.7 mmol/L (ref 3.5–5.1)
Sodium: 138 mmol/L (ref 135–145)

## 2017-09-16 LAB — CBC
HEMATOCRIT: 36 % (ref 36.0–46.0)
HEMOGLOBIN: 11 g/dL — AB (ref 12.0–15.0)
MCH: 28.5 pg (ref 26.0–34.0)
MCHC: 30.6 g/dL (ref 30.0–36.0)
MCV: 93.3 fL (ref 78.0–100.0)
Platelets: 488 10*3/uL — ABNORMAL HIGH (ref 150–400)
RBC: 3.86 MIL/uL — ABNORMAL LOW (ref 3.87–5.11)
RDW: 15.6 % — AB (ref 11.5–15.5)
WBC: 17.4 10*3/uL — ABNORMAL HIGH (ref 4.0–10.5)

## 2017-09-16 LAB — GLUCOSE, CAPILLARY
GLUCOSE-CAPILLARY: 177 mg/dL — AB (ref 65–99)
GLUCOSE-CAPILLARY: 126 mg/dL — AB (ref 65–99)
GLUCOSE-CAPILLARY: 141 mg/dL — AB (ref 65–99)
Glucose-Capillary: 123 mg/dL — ABNORMAL HIGH (ref 65–99)
Glucose-Capillary: 197 mg/dL — ABNORMAL HIGH (ref 65–99)

## 2017-09-16 MED ORDER — HYDROCOD POLST-CPM POLST ER 10-8 MG/5ML PO SUER
5.0000 mL | Freq: Once | ORAL | Status: AC
  Administered 2017-09-16: 5 mL via ORAL
  Filled 2017-09-16: qty 5

## 2017-09-16 NOTE — Care Management Note (Addendum)
Case Management Note  Patient Details  Name: Elizabeth Montes MRN: [id number] Date of Birth: 02/06/1956  Subjective/Objective:   Pt presented for Acute Respiratory Failure with Hypoxia. PTA from home with daughter and granddaughter. No PCP at this time. Pt was given the [facility] Number in order to set up PCP. Pt has [employer] Medicare- had Medicaid in [place] and now it is pending. CM did suggest pt go to [facility] and start new paperwork for [employer]. CM did place a call to the Financial Counselor to see if any other suggestions for patient. Pt states she has 02 with [facility] in [place] and [facility] via [employer].                  Action/Plan: CM is following for home 02 needs- with ambulation pt needs up to 6L per RN. CM did reach out to [employer] Rep Chalmers Cater to see if they can supply 02. CM awaiting phone call back. Pt with several readmissions- may benefit from HHRN/ SW once stable for d/c home. RN for disease management and SW to navigate social services for possible Medicaid Benefit.    Expected Discharge Date:                  Expected Discharge Plan:  Home w Home Health Services  In-House Referral:  Financial Counselor  Discharge planning Services  CM Consult  Post Acute Care Choice:  Durable Medical Equipment, Home Health Choice offered to:  Patient  DME Arranged:  Oxygen, Nebulizer DME Agency:  [facility]  HH Arranged:  RN, Disease Management, Social Work [employer] Agency:   [facility]  Status of Service:  Completed, signed off  If discussed at [employer], dates discussed:    Additional Comments: 1513 09-17-17 Tomi Bamberger, RN, BSN [phone] CM received approval from Physician Advisor that pt can be H[facility] with [facility]. Frances Furbish will assist patient with PCP as well. No further needs from CM at this time. CM did make Chiropodist with Social Work aware. No further needs from CM at this time.    1048 09-17-17 Tomi Bamberger, RN,BSN [phone] CM did speak with pt in regards to [facility]- pt is agreeable to services for RN, SW- agency list provided and pt chose [facility]. Referral sent to [facility] with Frances Furbish- SOC to begin within 24-48 hours post d/c. [employer] to come around 1130 to assist with titration of 02. Pt states she has transportation home via daughter. Please write orders and F2F for [facility] services. No further needs from CM at this time.    1519 09-16-17 Tomi Bamberger, RN,BSN [phone] CM did discuss with patient in regards to NIV and Nebulizer- plan to utilize [employer] for DME needs. CM to discuss [facility] Services with patient. Pt will need ambulatory sat prior to d/c. No further needs @ this time.    1216 09-16-17 Tomi Bamberger, RN,BSN [phone] CM did speak with Chalmers Cater with [facility]- pt has DME via company. Pt should be able to get 02 via [employer]. [employer] to reach out to MD listed in [place]. Staff RN to do a pulmonary sat note and MD agreeable to do an ABG to see if pt qualifies for Noninvasive Vent. CM to discuss [facility] with patient as well.  Gala Lewandowsky, RN 09/16/2017, 11:39 AM

## 2017-09-16 NOTE — Progress Notes (Signed)
Patient complaining of dry cough.  Patient had x1 dose of Tussionex for cough last night which per patient helped.  RN text paged [facility] requesting dose of Tussionex.

## 2017-09-16 NOTE — Progress Notes (Signed)
PROGRESS NOTE  Elizabeth Montes JYN:[id number] DOB: 1956/02/17 DOA: 09/13/2017 PCP: Patient, No Pcp Per  HPI/Recap of past 24 hours: SHAMIRAH IVAN a 62 y.o.femalewithhistory of chronic respiratory failure on home oxygen, pulmonary fibrosis, COPD, CHF, polymyositis, diabetes mellitus presented to ED with shortness of breath for last 2 days with productive cough.  Also reported some pleuritic type chest pain, no fevers or chills.  In ED, patient was febrile with temp of 102 F, chest x-ray showed congestion, patient was placed on BiPAP, admitted for further workup.  09/16/17: seen and examined at her bedside. Reports persistent dyspnea upon exertion. On supplemental O2 at home 3L continuously. Denies chest pain.   Assessment/Plan: Principal Problem:   Acute respiratory failure with hypoxia (HCC) Active Problems:   Hypertension   Polymyositis (HCC)   Healthcare-associated pneumonia   Chronic diastolic CHF (congestive heart failure) (HCC)   Diabetes mellitus type 2 in nonobese [facility])   Acute respiratory failure (HCC)  Acute on chronic hypoxic respiratory failure 2/2 to HCAP, acute COPD exacerbation -influenza panel negative -09/16/17 continue IV vancomycin and IV cefepime -now off BIPAP -09/16/17 continue O2 supplementation to maintain O2 sat 92% or greater -continue nebs -continue continuous pulse ox monitoring -continue aggressive pulmonary toilet  Acute COPD exacerbation -management as stated above -In addition, continue pulmicort, po steroids -09/16/17 continue aggressive pulmonary toilet with hypersaline nebs and chest PT  HTN -BP is stable -continue amlodipine, lopressor  Chronic diastolic heart failure -2D echo LVEF 55-60% (06/2017) -continue lasix  DM2 -continue lantus, novolog, ISS -A1C 8.5 (09/13/17)  Polymyositis -continue azathioprine  Leukocytois -most likely reactive from steroids use -wbc 17 from 17 from 19 2 days ago -afebrile -will obtain procalcitonin  and repeat CBC in the am    Code Status: full  Family Communication: none at bedside  Disposition Plan: to be determined   Consultants:  none   Procedures:  none  Antimicrobials:  IV vancomycin and IV cefepime  DVT prophylaxis:  sq heparin   Objective: Vitals:   09/16/17 0848 09/16/17 0933 09/16/17 1129 09/16/17 1303  BP:  133/62 125/69   Pulse:  85 79   Resp:   19   Temp:   97.9 F (36.6 C)   TempSrc:      SpO2: 97%  94% 93%  Weight:      Height:        Intake/Output Summary (Last 24 hours) at 09/16/2017 1628 Last data filed at 09/16/2017 1457 Gross per 24 hour  Intake 1780 ml  Output 3725 ml  Net -1945 ml   Filed Weights   09/14/17 0536 09/15/17 0455 09/16/17 0525  Weight: 86.8 kg (191 lb 5.8 oz) 87.5 kg (193 lb) 88.8 kg (195 lb 11.2 oz)    Exam: 09/16/17 patient seen and examined. Physical exam unchanged from prior.   General: 62 yo AAF WD WN NAD A&O x 3  Cardiovascular: RRR no rubs or gallops   Respiratory: mild rales bilaterally. No wheezes noted  Abdomen: soft obese NT Nd NBS x4  Musculoskeletal: no focal abnormalities   Skin: no rash  Psychiatry: mood is appropriate for condition and setting    Data Reviewed: CBC: Recent Labs  Lab 09/13/17 0345 09/14/17 0613 09/15/17 0500 09/16/17 0520  WBC 18.5* 19.9* 17.4* 17.4*  NEUTROABS 14.2*  --   --   --   HGB 13.0 11.1* 10.5* 11.0*  HCT 40.5 35.9* 34.7* 36.0  MCV 91.2 94.2 93.5 93.3  PLT 497* 472* 468* 488*  Basic Metabolic Panel: Recent Labs  Lab 09/13/17 0345 09/13/17 2054 09/14/17 0613 09/15/17 0500 09/16/17 0520  NA 140  --  136 137 138  K 2.6* 4.0 4.1 3.7 3.7  CL 102  --  101 105 104  CO2 23  --  23 22 25   GLUCOSE 160*  --  381* 254* 174*  BUN 10  --  15 18 17   CREATININE 1.04*  --  1.00 0.94 0.86  CALCIUM 9.0  --  7.9* 7.6* 7.8*  MG 1.5*  --   --   --   --    GFR: Estimated Creatinine Clearance: 74.5 mL/min (by C-G formula based on SCr of 0.86 mg/dL). Liver  Function Tests: Recent Labs  Lab 09/13/17 0345  AST 61*  ALT 72*  ALKPHOS 91  BILITOT 0.5  PROT 6.4*  ALBUMIN 3.0*   Recent Labs  Lab 09/13/17 0345  LIPASE 34   No results for input(s): AMMONIA in the last 168 hours. Coagulation Profile: No results for input(s): INR, PROTIME in the last 168 hours. Cardiac Enzymes: Recent Labs  Lab 09/13/17 0345  TROPONINI <0.03   BNP (last 3 results) No results for input(s): PROBNP in the last 8760 hours. HbA1C: Recent Labs    09/13/17 2054  HGBA1C 8.5*   CBG: Recent Labs  Lab 09/15/17 2131 09/16/17 0023 09/16/17 0731 09/16/17 1123 09/16/17 1620  GLUCAP 252* 197* 177* 123* 126*   Lipid Profile: No results for input(s): CHOL, HDL, LDLCALC, TRIG, CHOLHDL, LDLDIRECT in the last 72 hours. Thyroid Function Tests: No results for input(s): TSH, T4TOTAL, FREET4, T3FREE, THYROIDAB in the last 72 hours. Anemia Panel: No results for input(s): VITAMINB12, FOLATE, FERRITIN, TIBC, IRON, RETICCTPCT in the last 72 hours. Urine analysis:    Component Value Date/Time   COLORURINE YELLOW 07/16/2017 0526   APPEARANCEUR CLEAR 07/16/2017 0526   LABSPEC 1.010 07/16/2017 0526   PHURINE 5.0 07/16/2017 0526   GLUCOSEU >=500 (A) 07/16/2017 0526   HGBUR NEGATIVE 07/16/2017 0526   BILIRUBINUR NEGATIVE 07/16/2017 0526   KETONESUR NEGATIVE 07/16/2017 0526   PROTEINUR NEGATIVE 07/16/2017 0526   NITRITE NEGATIVE 07/16/2017 0526   LEUKOCYTESUR NEGATIVE 07/16/2017 0526   Sepsis Labs: @LABRCNTIP (procalcitonin:4,lacticidven:4)  ) Recent Results (from the past 240 hour(s))  MRSA PCR Screening     Status: None   Collection Time: 09/13/17  8:48 PM  Result Value Ref Range Status   MRSA by PCR NEGATIVE NEGATIVE Final    Comment:        The GeneXpert MRSA Assay (FDA approved for NASAL specimens only), is one component of a comprehensive MRSA colonization surveillance program. It is not intended to diagnose MRSA infection nor to guide or monitor  treatment for MRSA infections. Performed at [facility], [address], [place], [place] [address]       Studies: No results found.  Scheduled Meds: . amLODipine  10 mg Oral Daily  . aspirin EC  81 mg Oral Daily  . azaTHIOprine  25 mg Oral Daily  . budesonide (PULMICORT) nebulizer solution  0.25 mg Nebulization BID  . fenofibrate  160 mg Oral Daily  . furosemide  80 mg Oral Daily  . heparin injection (subcutaneous)  5,000 Units Subcutaneous Q8H  . insulin aspart  0-15 Units Subcutaneous TID WC  . insulin aspart  0-5 Units Subcutaneous QHS  . insulin aspart  20 Units Subcutaneous TID WC  . insulin glargine  25 Units Subcutaneous QHS  . ipratropium-albuterol  3 mL Nebulization  Q6H  . metoCLOPramide  5 mg Oral TID AC  . metoprolol tartrate  25 mg Oral BID  . predniSONE  40 mg Oral Q breakfast  . sodium chloride HYPERTONIC  4 mL Nebulization BID    Continuous Infusions: . ceFEPime (MAXIPIME) IV Stopped (09/16/17 1349)  . vancomycin Stopped (09/16/17 0735)     LOS: 3 days     Darlin Drop, MD [facility] Pager [phone]  If 7PM-7AM, please contact night-coverage [email or web address] Password [facility] 09/16/2017, 4:28 PM

## 2017-09-17 DIAGNOSIS — E669 Obesity, unspecified: Secondary | ICD-10-CM

## 2017-09-17 LAB — CBC
HEMATOCRIT: 39.1 % (ref 36.0–46.0)
HEMOGLOBIN: 12.2 g/dL (ref 12.0–15.0)
MCH: 29 pg (ref 26.0–34.0)
MCHC: 31.2 g/dL (ref 30.0–36.0)
MCV: 92.9 fL (ref 78.0–100.0)
Platelets: 509 10*3/uL — ABNORMAL HIGH (ref 150–400)
RBC: 4.21 MIL/uL (ref 3.87–5.11)
RDW: 15.7 % — ABNORMAL HIGH (ref 11.5–15.5)
WBC: 16.3 10*3/uL — ABNORMAL HIGH (ref 4.0–10.5)

## 2017-09-17 LAB — GLUCOSE, CAPILLARY
Glucose-Capillary: 122 mg/dL — ABNORMAL HIGH (ref 65–99)
Glucose-Capillary: 153 mg/dL — ABNORMAL HIGH (ref 65–99)

## 2017-09-17 LAB — PROCALCITONIN: Procalcitonin: <0.1 ng/mL

## 2017-09-17 MED ORDER — FUROSEMIDE 80 MG PO TABS: 80.0000 mg | ORAL_TABLET | Freq: Every day | ORAL | 0 refills | Status: DC

## 2017-09-17 MED ORDER — AMLODIPINE BESYLATE 10 MG PO TABS: 10.0000 mg | ORAL_TABLET | Freq: Every day | ORAL | 0 refills | Status: DC

## 2017-09-17 MED ORDER — BISACODYL 5 MG PO TBEC: 5.0000 mg | DELAYED_RELEASE_TABLET | Freq: Every day | ORAL | 0 refills | Status: DC | PRN

## 2017-09-17 MED ORDER — ASPIRIN 81 MG PO TBEC: 81.0000 mg | DELAYED_RELEASE_TABLET | Freq: Every day | ORAL | 0 refills | Status: DC

## 2017-09-17 MED ORDER — AZATHIOPRINE 50 MG PO TABS: 25.0000 mg | ORAL_TABLET | Freq: Every day | ORAL | 0 refills | Status: DC

## 2017-09-17 MED ORDER — IPRATROPIUM-ALBUTEROL 0.5-2.5 (3) MG/3ML IN SOLN
3.0000 mL | Freq: Three times a day (TID) | RESPIRATORY_TRACT | Status: DC
  Administered 2017-09-17: 3 mL via RESPIRATORY_TRACT
  Filled 2017-09-17: qty 3

## 2017-09-17 MED ORDER — BUDESONIDE 0.25 MG/2ML IN SUSP: 0.2500 mg | Freq: Two times a day (BID) | RESPIRATORY_TRACT | 12 refills | Status: DC

## 2017-09-17 MED ORDER — METOPROLOL TARTRATE 25 MG PO TABS: 25.0000 mg | ORAL_TABLET | Freq: Two times a day (BID) | ORAL | 0 refills | Status: DC

## 2017-09-17 MED ORDER — FENOFIBRATE 160 MG PO TABS: 160.0000 mg | ORAL_TABLET | Freq: Every day | ORAL | 0 refills | Status: DC

## 2017-09-17 MED ORDER — HYDROCOD POLST-CPM POLST ER 10-8 MG/5ML PO SUER
5.0000 mL | Freq: Once | ORAL | Status: AC
  Administered 2017-09-17: 5 mL via ORAL
  Filled 2017-09-17: qty 5

## 2017-09-17 MED ORDER — HEPARIN SOD (PORK) LOCK FLUSH 100 UNIT/ML IV SOLN
500.0000 [IU] | INTRAVENOUS | Status: AC | PRN
  Administered 2017-09-17: 500 [IU]

## 2017-09-17 NOTE — Discharge Summary (Signed)
Discharge Summary  Elizabeth Montes:[id number] DOB: January 23, 1956  PCP: Patient, No Pcp Per  Admit date: 09/13/2017 Discharge date: 09/17/2017  Time spent: 25 minutes   Recommendations for Outpatient Follow-up:  1. Follow up with PCP 2. Take your medications as prescribed  Discharge Diagnoses:  Active Hospital Problems   Diagnosis Date Noted  . Acute respiratory failure with hypoxia (HCC) 08/01/2017  . Chronic diastolic CHF (congestive heart failure) (HCC) 09/13/2017  . Diabetes mellitus type 2 in nonobese (HCC) 09/13/2017  . Acute respiratory failure (HCC) 09/13/2017  . Healthcare-associated pneumonia 08/01/2017  . 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 Hypertension 07/16/2017    Resolved Hospital Problems  No resolved problems to display.    Discharge Condition: stable  Diet recommendation: resume previous diet   Vitals:   09/17/17 1138 09/17/17 1322  BP: 131/66   Pulse: 81   Resp: (!) 28   Temp: 97.9 F (36.6 C)   SpO2: 90% 97%    History of present illness:  Elizabeth Montes a 62 y.o.femalewithhistory of chronic respiratory failure on home oxygen, pulmonary fibrosis, COPD, CHF, polymyositis, diabetes mellitus presented to ED with shortness of breath for last 2 days with productive cough. Also reported some pleuritic type chest pain, no fevers or chills. In ED, patient was febrile with temp of 102 F, chest x-ray showed congestion, patient was placed on BiPAP, admitted for further workup. On 3L [place] continuously at home.  On the day of discharge the patient was hemodynamically stable. She will need to follow up with PCP post hospitalization.   Hospital Course:  Principal Problem:   Acute respiratory failure with hypoxia (HCC) Active Problems:   Hypertension   Polymyositis (HCC)   Healthcare-associated pneumonia   Chronic diastolic CHF (congestive heart failure) (HCC)   Diabetes mellitus type 2 in nonobese (HCC)   Acute respiratory failure (HCC)  Acute on  chronic hypoxic respiratory failure 2/2 to HCAP, acute COPD exacerbation -on 3L O2 [place] at home continuously -influenza panel negative -09/17/17 completed IV vancomycin and IV cefepime -off BIPAP -09/17/17 continue O2 supplementation to maintain O2 sat 92% or greater  Acute COPD exacerbation -continue home meds  HTN -BP is stable -continue amlodipine, lopressor  Chronic diastolic heart failure -2D echo LVEF 55-60% (06/2017) -continue lasix  DM2 -continue home meds -A1C 8.5 (09/13/17)  Polymyositis -continue azathioprine  Leukocytois -most likely reactive from steroids use -wbc 16k from 17 from 17 from 19k 2 days ago -procalcitonin <0.10 -afebrile  Obesity -BMI 34 -weight loss in the outpatient setting   Procedures:  none   Consultations:  none  Discharge Exam: BP 131/66 (BP Location: Right Arm)   Pulse 81   Temp 97.9 F (36.6 C) (Oral)   Resp (!) 28   Ht 5\' 3"  (1.6 m)   Wt 87.6 kg (193 lb 3.2 oz)   SpO2 97%   BMI 34.22 kg/m   General: 62 yo AAF obese in NAD A&O x 3  Cardiovascular: RRR no rubs or gallops Respiratory: CTA no wheezes or rales  Discharge Instructions You were cared for by a hospitalist during your hospital stay. If you have any questions about your discharge medications or the care you received while you were in the hospital after you are discharged, you can call the unit and asked to speak with the hospitalist on call if the hospitalist that took care of you is not available. Once you are discharged, your primary care physician will handle any further medical issues. Please note that  NO REFILLS for any discharge medications will be authorized once you are discharged, as it is imperative that you return to your primary care physician (or establish a relationship with a primary care physician if you do not have one) for your aftercare needs so that they can reassess your need for medications and monitor your lab values.   Allergies as of  09/17/2017      Reactions   Lovenox [enoxaparin Sodium] Other (See Comments)   "made me bleed"      Medication List    STOP taking these medications   NICOTINE MINI 4 MG lozenge Generic drug:  nicotine polacrilex   SYMBICORT 160-4.5 MCG/ACT inhaler Generic drug:  budesonide-formoterol     TAKE these medications   amLODipine 10 MG tablet Commonly known as:  NORVASC Take 1 tablet (10 mg total) by mouth daily. Start taking on:  09/18/2017   antiseptic oral rinse Liqd Take 15 mLs by mouth as needed for dry mouth.   aspirin 81 MG EC tablet Take 1 tablet (81 mg total) by mouth daily. Start taking on:  09/18/2017   azaTHIOprine 50 MG tablet Commonly known as:  IMURAN Take 0.5 tablets (25 mg total) by mouth daily. Start taking on:  09/18/2017   bisacodyl 5 MG EC tablet Commonly known as:  DULCOLAX Take 1 tablet (5 mg total) by mouth daily as needed for mild constipation.   budesonide 0.25 MG/2ML nebulizer solution Commonly known as:  PULMICORT Take 2 mLs (0.25 mg total) by nebulization 2 (two) times daily.   fenofibrate 160 MG tablet Take 1 tablet (160 mg total) by mouth daily. Start taking on:  09/18/2017   furosemide 80 MG tablet Commonly known as:  LASIX Take 1 tablet (80 mg total) by mouth daily. Start taking on:  09/18/2017   liraglutide 18 MG/3ML Sopn Commonly known as:  VICTOZA Inject 1.2 mg into the skin daily.   metoCLOPramide 10 MG tablet Commonly known as:  REGLAN Take 5 mg by mouth 3 (three) times daily before meals.   metoprolol tartrate 25 MG tablet Commonly known as:  LOPRESSOR Take 1 tablet (25 mg total) by mouth 2 (two) times daily.   NOVOLOG FLEXPEN 100 UNIT/ML FlexPen Generic drug:  insulin aspart Inject 20 Units into the skin 3 (three) times daily with meals.   oxyCODONE-acetaminophen 5-325 MG tablet Commonly known as:  PERCOCET/ROXICET Take 1 tablet by mouth every 4 (four) hours as needed for severe pain.   SPIRIVA HANDIHALER 18 MCG  inhalation capsule Generic drug:  tiotropium Place 18 mcg into inhaler and inhale daily as needed (for wheezing).            Durable Medical Equipment  (From admission, onward)        Start     Ordered   09/17/17 1033  For home use only DME oxygen  Once    Comments:  3 liters at rest; 6 liters for ambulation  Question Answer Comment  Mode or (Route) Nasal cannula   Liters per Minute 6   Frequency Continuous (stationary and portable oxygen unit needed)   Oxygen delivery system Gas      09/17/17 1034   09/16/17 1512  For home use only DME Nebulizer machine  Once    Question:  Patient needs a nebulizer to treat with the following condition  Answer:  COPD (chronic obstructive pulmonary disease) (HCC)   09/16/17 1515     Allergies  Allergen Reactions  . Lovenox [Enoxaparin Sodium] Other (See  Comments)    "made me bleed"   Follow-up Information    [facility] [facility] Follow up.   Why:  Oxygen Contact information: [address] [place] [address] [phone]        Care, [facility] Follow up.   Specialty:  Home Health Services Why:  Registered Nurse and Social Worker Contact information: [address] [place] [address] [phone]            The results of significant diagnostics from this hospitalization (including imaging, microbiology, ancillary and laboratory) are listed below for reference.    Significant Diagnostic Studies: Dg Chest Port 1 View  Result Date: 09/13/2017 CLINICAL DATA:  CHF.  Shortness of breath.  Chest pain.  Cough. EXAM: PORTABLE CHEST 1 VIEW COMPARISON:  Frontal and lateral views 08/11/2017 FINDINGS: Tip of the left upper extremity PICC in the proximal SVC. Heart is upper normal in size. Mediastinal contours are unchanged. Increased interstitial opacities above baseline suspicious for pulmonary edema. Streaky bibasilar opacities are likely atelectasis. No large pleural effusion. No pneumothorax.  IMPRESSION: 1. Mild pulmonary edema. 2. Increased bibasilar atelectasis. 3. Tip of the left upper extremity PICC in the proximal SVC. Electronically Signed   By: Rubye Oaks M.D.   On: 09/13/2017 02:46    Microbiology: Recent Results (from the past 240 hour(s))  MRSA PCR Screening     Status: None   Collection Time: 09/13/17  8:48 PM  Result Value Ref Range Status   MRSA by PCR NEGATIVE NEGATIVE Final    Comment:        The GeneXpert MRSA Assay (FDA approved for NASAL specimens only), is one component of a comprehensive MRSA colonization surveillance program. It is not intended to diagnose MRSA infection nor to guide or monitor treatment for MRSA infections. Performed at [facility], [address] [place], [place] [address]      Labs: Basic Metabolic Panel: Recent Labs  Lab 09/13/17 0345 09/13/17 2054 09/14/17 0613 09/15/17 0500 09/16/17 0520  NA 140  --  136 137 138  K 2.6* 4.0 4.1 3.7 3.7  CL 102  --  101 105 104  CO2 23  --  23 22 25   GLUCOSE 160*  --  381* 254* 174*  BUN 10  --  15 18 17   CREATININE 1.04*  --  1.00 0.94 0.86  CALCIUM 9.0  --  7.9* 7.6* 7.8*  MG 1.5*  --   --   --   --    Liver Function Tests: Recent Labs  Lab 09/13/17 0345  AST 61*  ALT 72*  ALKPHOS 91  BILITOT 0.5  PROT 6.4*  ALBUMIN 3.0*   Recent Labs  Lab 09/13/17 0345  LIPASE 34   No results for input(s): AMMONIA in the last 168 hours. CBC: Recent Labs  Lab 09/13/17 0345 09/14/17 0613 09/15/17 0500 09/16/17 0520 09/17/17 1202  WBC 18.5* 19.9* 17.4* 17.4* 16.3*  NEUTROABS 14.2*  --   --   --   --   HGB 13.0 11.1* 10.5* 11.0* 12.2  HCT 40.5 35.9* 34.7* 36.0 39.1  MCV 91.2 94.2 93.5 93.3 92.9  PLT 497* 472* 468* 488* 509*   Cardiac Enzymes: Recent Labs  Lab 09/13/17 0345  TROPONINI <0.03   BNP: BNP (last 3 results) Recent Labs    08/01/17 0614 08/11/17 0103 09/13/17 0345  BNP 232.6* 82.5 41.3    ProBNP (last 3 results) No results for  input(s): PROBNP in the  last 8760 hours.  CBG: Recent Labs  Lab 09/16/17 1123 09/16/17 1620 09/16/17 2023 09/17/17 0737 09/17/17 1137  GLUCAP 123* 126* 141* 122* 153*       Signed:  Darlin Drop, MD [facility] 09/17/2017, 1:39 PM

## 2017-09-17 NOTE — Progress Notes (Signed)
SATURATION QUALIFICATIONS: (This note is used to comply with regulatory documentation for home oxygen)  Patient Saturations on 3 Liters of oxygen at Rest = 98%  Patient Saturations on 3 Liters of oxygen while Ambulating = 78%  Patient Saturations on 6 Liters of oxygen while Ambulating = 84% (recovers to >90% with rest)  Please briefly explain why patient needs home oxygen: Patient previously on 3 liters of oxygen continuously at home. Patient requires 6 liters of oxygen while ambulating to help maintain adequate oxygen saturations.

## 2017-09-30 ENCOUNTER — Other Ambulatory Visit: Payer: Self-pay

## 2017-09-30 ENCOUNTER — Emergency Department (HOSPITAL_COMMUNITY): Payer: Medicare HMO

## 2017-09-30 ENCOUNTER — Encounter (HOSPITAL_COMMUNITY): Payer: Self-pay | Admitting: Emergency Medicine

## 2017-09-30 ENCOUNTER — Inpatient Hospital Stay (HOSPITAL_COMMUNITY)
Admission: EM | Admit: 2017-09-30 | Discharge: 2017-10-05 | DRG: 190 | Disposition: A | Discharge status: Home / Self Care | Payer: Medicare HMO | Attending: Internal Medicine | Admitting: Internal Medicine

## 2017-09-30 DIAGNOSIS — I10 Essential (primary) hypertension: Secondary | ICD-10-CM | POA: Diagnosis not present

## 2017-09-30 DIAGNOSIS — J810 Acute pulmonary edema: Secondary | ICD-10-CM

## 2017-09-30 DIAGNOSIS — I2510 Atherosclerotic heart disease of native coronary artery without angina pectoris: Secondary | ICD-10-CM | POA: Diagnosis present

## 2017-09-30 DIAGNOSIS — D72829 Elevated white blood cell count, unspecified: Secondary | ICD-10-CM | POA: Diagnosis present

## 2017-09-30 DIAGNOSIS — E1122 Type 2 diabetes mellitus with diabetic chronic kidney disease: Secondary | ICD-10-CM | POA: Diagnosis present

## 2017-09-30 DIAGNOSIS — N182 Chronic kidney disease, stage 2 (mild): Secondary | ICD-10-CM | POA: Diagnosis present

## 2017-09-30 DIAGNOSIS — J9811 Atelectasis: Secondary | ICD-10-CM | POA: Diagnosis present

## 2017-09-30 DIAGNOSIS — Z833 Family history of diabetes mellitus: Secondary | ICD-10-CM

## 2017-09-30 DIAGNOSIS — R0603 Acute respiratory distress: Secondary | ICD-10-CM

## 2017-09-30 DIAGNOSIS — Z6833 Body mass index (BMI) 33.0-33.9, adult: Secondary | ICD-10-CM

## 2017-09-30 DIAGNOSIS — R0902 Hypoxemia: Secondary | ICD-10-CM | POA: Diagnosis not present

## 2017-09-30 DIAGNOSIS — Z7982 Long term (current) use of aspirin: Secondary | ICD-10-CM

## 2017-09-30 DIAGNOSIS — I252 Old myocardial infarction: Secondary | ICD-10-CM | POA: Diagnosis not present

## 2017-09-30 DIAGNOSIS — J8410 Pulmonary fibrosis, unspecified: Secondary | ICD-10-CM | POA: Diagnosis present

## 2017-09-30 DIAGNOSIS — IMO0002 Reserved for concepts with insufficient information to code with codable children: Secondary | ICD-10-CM

## 2017-09-30 DIAGNOSIS — Z79899 Other long term (current) drug therapy: Secondary | ICD-10-CM

## 2017-09-30 DIAGNOSIS — J9621 Acute and chronic respiratory failure with hypoxia: Secondary | ICD-10-CM | POA: Diagnosis present

## 2017-09-30 DIAGNOSIS — Z955 Presence of coronary angioplasty implant and graft: Secondary | ICD-10-CM | POA: Diagnosis not present

## 2017-09-30 DIAGNOSIS — Z794 Long term (current) use of insulin: Secondary | ICD-10-CM

## 2017-09-30 DIAGNOSIS — Z888 Allergy status to other drugs, medicaments and biological substances status: Secondary | ICD-10-CM

## 2017-09-30 DIAGNOSIS — K219 Gastro-esophageal reflux disease without esophagitis: Secondary | ICD-10-CM | POA: Diagnosis present

## 2017-09-30 DIAGNOSIS — J441 Chronic obstructive pulmonary disease with (acute) exacerbation: Principal | ICD-10-CM | POA: Diagnosis present

## 2017-09-30 DIAGNOSIS — I130 Hypertensive heart and chronic kidney disease with heart failure and stage 1 through stage 4 chronic kidney disease, or unspecified chronic kidney disease: Secondary | ICD-10-CM | POA: Diagnosis present

## 2017-09-30 DIAGNOSIS — Z9981 Dependence on supplemental oxygen: Secondary | ICD-10-CM

## 2017-09-30 DIAGNOSIS — R627 Adult failure to thrive: Secondary | ICD-10-CM | POA: Diagnosis present

## 2017-09-30 DIAGNOSIS — I272 Pulmonary hypertension, unspecified: Secondary | ICD-10-CM | POA: Diagnosis present

## 2017-09-30 DIAGNOSIS — J45901 Unspecified asthma with (acute) exacerbation: Secondary | ICD-10-CM | POA: Diagnosis present

## 2017-09-30 DIAGNOSIS — E1165 Type 2 diabetes mellitus with hyperglycemia: Secondary | ICD-10-CM | POA: Diagnosis present

## 2017-09-30 DIAGNOSIS — I5032 Chronic diastolic (congestive) heart failure: Secondary | ICD-10-CM | POA: Diagnosis present

## 2017-09-30 DIAGNOSIS — T380X5A Adverse effect of glucocorticoids and synthetic analogues, initial encounter: Secondary | ICD-10-CM | POA: Diagnosis present

## 2017-09-30 DIAGNOSIS — Z7952 Long term (current) use of systemic steroids: Secondary | ICD-10-CM | POA: Diagnosis not present

## 2017-09-30 DIAGNOSIS — Z7951 Long term (current) use of inhaled steroids: Secondary | ICD-10-CM

## 2017-09-30 DIAGNOSIS — M3320 Polymyositis, organ involvement unspecified: Secondary | ICD-10-CM | POA: Diagnosis present

## 2017-09-30 LAB — COMPREHENSIVE METABOLIC PANEL
ALBUMIN: 3.1 g/dL — AB (ref 3.5–5.0)
ALT: 36 U/L (ref 14–54)
AST: 45 U/L — AB (ref 15–41)
Alkaline Phosphatase: 75 U/L (ref 38–126)
Anion gap: 16 — ABNORMAL HIGH (ref 5–15)
BUN: 11 mg/dL (ref 6–20)
CHLORIDE: 103 mmol/L (ref 101–111)
CO2: 18 mmol/L — AB (ref 22–32)
CREATININE: 1.02 mg/dL — AB (ref 0.44–1.00)
Calcium: 9 mg/dL (ref 8.9–10.3)
GFR calc Af Amer: >60 mL/min (ref >60)
GFR calc non Af Amer: 59 mL/min — ABNORMAL LOW (ref >60)
Glucose, Bld: 213 mg/dL — ABNORMAL HIGH (ref 65–99)
POTASSIUM: 2.8 mmol/L — AB (ref 3.5–5.1)
SODIUM: 137 mmol/L (ref 135–145)
Total Bilirubin: 0.7 mg/dL (ref 0.3–1.2)
Total Protein: 6.8 g/dL (ref 6.5–8.1)

## 2017-09-30 LAB — I-STAT CG4 LACTIC ACID, ED
LACTIC ACID, VENOUS: 1.34 mmol/L (ref 0.5–1.9)
Lactic Acid, Venous: 2.37 mmol/L (ref 0.5–1.9)

## 2017-09-30 LAB — CBC WITH DIFFERENTIAL/PLATELET
Basophils Absolute: 0.1 10*3/uL (ref 0.0–0.1)
Basophils Relative: 0 %
EOS ABS: 0.4 10*3/uL (ref 0.0–0.7)
EOS PCT: 2 %
HCT: 42.4 % (ref 36.0–46.0)
HEMOGLOBIN: 13.5 g/dL (ref 12.0–15.0)
LYMPHS ABS: 2.1 10*3/uL (ref 0.7–4.0)
LYMPHS PCT: 12 %
MCH: 28.9 pg (ref 26.0–34.0)
MCHC: 31.8 g/dL (ref 30.0–36.0)
MCV: 90.8 fL (ref 78.0–100.0)
MONOS PCT: 4 %
Monocytes Absolute: 0.7 10*3/uL (ref 0.1–1.0)
Neutro Abs: 14.5 10*3/uL — ABNORMAL HIGH (ref 1.7–7.7)
Neutrophils Relative %: 82 %
PLATELETS: 433 10*3/uL — AB (ref 150–400)
RBC: 4.67 MIL/uL (ref 3.87–5.11)
RDW: 16.2 % — ABNORMAL HIGH (ref 11.5–15.5)
WBC: 17.8 10*3/uL — ABNORMAL HIGH (ref 4.0–10.5)

## 2017-09-30 LAB — I-STAT VENOUS BLOOD GAS, ED
Acid-base deficit: 3 mmol/L — ABNORMAL HIGH (ref 0.0–2.0)
Bicarbonate: 20.3 mmol/L (ref 20.0–28.0)
O2 Saturation: 83 %
TCO2: 21 mmol/L — ABNORMAL LOW (ref 22–32)
pCO2, Ven: 29.9 mmHg — ABNORMAL LOW (ref 44.0–60.0)
pH, Ven: 7.44 — ABNORMAL HIGH (ref 7.250–7.430)
pO2, Ven: 44 mmHg (ref 32.0–45.0)

## 2017-09-30 LAB — CBG MONITORING, ED
GLUCOSE-CAPILLARY: 301 mg/dL — AB (ref 65–99)
Glucose-Capillary: 404 mg/dL — ABNORMAL HIGH (ref 65–99)
Glucose-Capillary: 471 mg/dL — ABNORMAL HIGH (ref 65–99)
Glucose-Capillary: 318 mg/dL — ABNORMAL HIGH (ref 65–99)
Glucose-Capillary: 315 mg/dL — ABNORMAL HIGH (ref 65–99)

## 2017-09-30 LAB — RAPID URINE DRUG SCREEN, HOSP PERFORMED
Amphetamines: NOT DETECTED
BARBITURATES: NOT DETECTED
Benzodiazepines: NOT DETECTED
Cocaine: NOT DETECTED
Opiates: NOT DETECTED
Tetrahydrocannabinol: NOT DETECTED

## 2017-09-30 LAB — RESPIRATORY PANEL BY PCR
ADENOVIRUS-RVPPCR: NOT DETECTED
Bordetella pertussis: NOT DETECTED
CORONAVIRUS NL63-RVPPCR: NOT DETECTED
CORONAVIRUS OC43-RVPPCR: NOT DETECTED
Chlamydophila pneumoniae: NOT DETECTED
Coronavirus 229E: NOT DETECTED
Coronavirus HKU1: NOT DETECTED
INFLUENZA A-RVPPCR: NOT DETECTED
INFLUENZA B-RVPPCR: NOT DETECTED
METAPNEUMOVIRUS-RVPPCR: NOT DETECTED
Mycoplasma pneumoniae: NOT DETECTED
PARAINFLUENZA VIRUS 1-RVPPCR: NOT DETECTED
PARAINFLUENZA VIRUS 3-RVPPCR: NOT DETECTED
PARAINFLUENZA VIRUS 4-RVPPCR: NOT DETECTED
Parainfluenza Virus 2: NOT DETECTED
RESPIRATORY SYNCYTIAL VIRUS-RVPPCR: NOT DETECTED
RHINOVIRUS / ENTEROVIRUS - RVPPCR: NOT DETECTED

## 2017-09-30 LAB — GLUCOSE, CAPILLARY: Glucose-Capillary: 315 mg/dL — ABNORMAL HIGH (ref 65–99)

## 2017-09-30 LAB — BRAIN NATRIURETIC PEPTIDE: B Natriuretic Peptide: 79.2 pg/mL (ref 0.0–100.0)

## 2017-09-30 LAB — I-STAT TROPONIN, ED: Troponin i, poc: 0.01 ng/mL (ref 0.00–0.08)

## 2017-09-30 MED ORDER — AZATHIOPRINE 50 MG PO TABS
25.0000 mg | ORAL_TABLET | Freq: Every day | ORAL | Status: DC
  Administered 2017-10-01 – 2017-10-05 (×5): 25 mg via ORAL
  Filled 2017-09-30 (×5): qty 1

## 2017-09-30 MED ORDER — ONDANSETRON HCL 4 MG PO TABS: 4.0000 mg | ORAL_TABLET | Freq: Four times a day (QID) | ORAL | Status: DC | PRN

## 2017-09-30 MED ORDER — OXYCODONE-ACETAMINOPHEN 5-325 MG PO TABS: 1.0000 | ORAL_TABLET | ORAL | Status: DC | PRN

## 2017-09-30 MED ORDER — SODIUM CHLORIDE 0.9 % IV SOLN
2.0000 g | Freq: Two times a day (BID) | INTRAVENOUS | Status: DC
  Administered 2017-09-30 – 2017-10-01 (×3): 2 g via INTRAVENOUS
  Filled 2017-09-30 (×3): qty 2

## 2017-09-30 MED ORDER — ENOXAPARIN SODIUM 40 MG/0.4ML ~~LOC~~ SOLN
40.0000 mg | SUBCUTANEOUS | Status: DC
  Administered 2017-09-30 – 2017-10-03 (×4): 40 mg via SUBCUTANEOUS
  Filled 2017-09-30 (×5): qty 0.4

## 2017-09-30 MED ORDER — MOMETASONE FURO-FORMOTEROL FUM 200-5 MCG/ACT IN AERO: 2.0000 | INHALATION_SPRAY | Freq: Two times a day (BID) | RESPIRATORY_TRACT | Status: DC

## 2017-09-30 MED ORDER — BUDESONIDE 0.25 MG/2ML IN SUSP: 0.2500 mg | Freq: Two times a day (BID) | RESPIRATORY_TRACT | Status: DC

## 2017-09-30 MED ORDER — POLYETHYLENE GLYCOL 3350 17 G PO PACK: 17.0000 g | PACK | Freq: Every day | ORAL | Status: DC | PRN

## 2017-09-30 MED ORDER — ACETAMINOPHEN 650 MG RE SUPP: 650.0000 mg | Freq: Four times a day (QID) | RECTAL | Status: DC | PRN

## 2017-09-30 MED ORDER — FUROSEMIDE 80 MG PO TABS
80.0000 mg | ORAL_TABLET | Freq: Every day | ORAL | Status: DC
  Administered 2017-09-30 – 2017-10-05 (×6): 80 mg via ORAL
  Filled 2017-09-30: qty 1
  Filled 2017-09-30: qty 4
  Filled 2017-09-30 (×4): qty 1

## 2017-09-30 MED ORDER — IBUPROFEN 800 MG PO TABS: 800.0000 mg | ORAL_TABLET | Freq: Four times a day (QID) | ORAL | Status: DC | PRN

## 2017-09-30 MED ORDER — INSULIN ASPART 100 UNIT/ML ~~LOC~~ SOLN
0.0000 [IU] | Freq: Every day | SUBCUTANEOUS | Status: DC
  Administered 2017-09-30: 4 [IU] via SUBCUTANEOUS
  Administered 2017-10-02: 3 [IU] via SUBCUTANEOUS
  Administered 2017-10-03 – 2017-10-04 (×2): 2 [IU] via SUBCUTANEOUS

## 2017-09-30 MED ORDER — SODIUM CHLORIDE 0.9% FLUSH
3.0000 mL | Freq: Two times a day (BID) | INTRAVENOUS | Status: DC
Start: 2017-09-30 — End: 2017-10-05
  Administered 2017-09-30 – 2017-10-05 (×7): 3 mL via INTRAVENOUS

## 2017-09-30 MED ORDER — ALBUTEROL SULFATE (2.5 MG/3ML) 0.083% IN NEBU: 2.5000 mg | INHALATION_SOLUTION | RESPIRATORY_TRACT | Status: DC | PRN

## 2017-09-30 MED ORDER — METOPROLOL TARTRATE 25 MG PO TABS
25.0000 mg | ORAL_TABLET | Freq: Two times a day (BID) | ORAL | Status: DC
  Administered 2017-09-30 – 2017-10-05 (×10): 25 mg via ORAL
  Filled 2017-09-30 (×10): qty 1

## 2017-09-30 MED ORDER — ONDANSETRON HCL 4 MG/2ML IJ SOLN: 4.0000 mg | Freq: Four times a day (QID) | INTRAMUSCULAR | Status: DC | PRN

## 2017-09-30 MED ORDER — POTASSIUM CHLORIDE CRYS ER 20 MEQ PO TBCR
40.0000 meq | EXTENDED_RELEASE_TABLET | Freq: Two times a day (BID) | ORAL | Status: AC
  Administered 2017-09-30 (×2): 40 meq via ORAL
  Filled 2017-09-30 (×2): qty 2

## 2017-09-30 MED ORDER — INSULIN ASPART 100 UNIT/ML FLEXPEN
25.0000 [IU] | PEN_INJECTOR | Freq: Three times a day (TID) | SUBCUTANEOUS | Status: DC
  Filled 2017-09-30: qty 3

## 2017-09-30 MED ORDER — LIRAGLUTIDE 18 MG/3ML ~~LOC~~ SOPN: 1.2000 mg | PEN_INJECTOR | Freq: Every day | SUBCUTANEOUS | Status: DC

## 2017-09-30 MED ORDER — ACETAMINOPHEN 325 MG PO TABS
650.0000 mg | ORAL_TABLET | Freq: Four times a day (QID) | ORAL | Status: DC | PRN
  Administered 2017-10-01: 650 mg via ORAL
  Filled 2017-09-30: qty 2

## 2017-09-30 MED ORDER — TIOTROPIUM BROMIDE MONOHYDRATE 18 MCG IN CAPS
18.0000 ug | ORAL_CAPSULE | Freq: Every day | RESPIRATORY_TRACT | Status: DC
  Administered 2017-09-30 – 2017-10-05 (×4): 18 ug via RESPIRATORY_TRACT
  Filled 2017-09-30 (×3): qty 5

## 2017-09-30 MED ORDER — GUAIFENESIN ER 600 MG PO TB12
600.0000 mg | ORAL_TABLET | Freq: Two times a day (BID) | ORAL | Status: DC
  Administered 2017-09-30 – 2017-10-05 (×11): 600 mg via ORAL
  Filled 2017-09-30 (×11): qty 1

## 2017-09-30 MED ORDER — BISACODYL 5 MG PO TBEC: 5.0000 mg | DELAYED_RELEASE_TABLET | Freq: Every day | ORAL | Status: DC | PRN

## 2017-09-30 MED ORDER — ALBUTEROL SULFATE (2.5 MG/3ML) 0.083% IN NEBU
2.5000 mg | INHALATION_SOLUTION | RESPIRATORY_TRACT | Status: DC
  Administered 2017-09-30: 2.5 mg via RESPIRATORY_TRACT

## 2017-09-30 MED ORDER — BUDESONIDE 0.25 MG/2ML IN SUSP
0.2500 mg | Freq: Two times a day (BID) | RESPIRATORY_TRACT | Status: DC
  Administered 2017-09-30 – 2017-10-05 (×9): 0.25 mg via RESPIRATORY_TRACT
  Filled 2017-09-30 (×13): qty 2

## 2017-09-30 MED ORDER — INSULIN ASPART 100 UNIT/ML ~~LOC~~ SOLN
31.0000 [IU] | Freq: Three times a day (TID) | SUBCUTANEOUS | Status: DC
  Administered 2017-10-01 – 2017-10-02 (×2): 31 [IU] via SUBCUTANEOUS

## 2017-09-30 MED ORDER — INSULIN ASPART 100 UNIT/ML ~~LOC~~ SOLN
10.0000 [IU] | SUBCUTANEOUS | Status: DC | PRN
  Administered 2017-09-30 (×2): 10 [IU] via INTRAVENOUS

## 2017-09-30 MED ORDER — ASPIRIN EC 81 MG PO TBEC
81.0000 mg | DELAYED_RELEASE_TABLET | Freq: Every day | ORAL | Status: DC
  Administered 2017-10-01 – 2017-10-05 (×5): 81 mg via ORAL
  Filled 2017-09-30 (×6): qty 1

## 2017-09-30 MED ORDER — TIOTROPIUM BROMIDE MONOHYDRATE 18 MCG IN CAPS
18.0000 ug | ORAL_CAPSULE | Freq: Every day | RESPIRATORY_TRACT | Status: DC | PRN
  Filled 2017-09-30: qty 5

## 2017-09-30 MED ORDER — IPRATROPIUM-ALBUTEROL 0.5-2.5 (3) MG/3ML IN SOLN
3.0000 mL | Freq: Four times a day (QID) | RESPIRATORY_TRACT | Status: DC
Start: 2017-09-30 — End: 2017-10-01
  Administered 2017-09-30 – 2017-10-01 (×4): 3 mL via RESPIRATORY_TRACT
  Filled 2017-09-30 (×3): qty 3

## 2017-09-30 MED ORDER — IPRATROPIUM-ALBUTEROL 0.5-2.5 (3) MG/3ML IN SOLN
3.0000 mL | Freq: Once | RESPIRATORY_TRACT | Status: DC
  Filled 2017-09-30: qty 3

## 2017-09-30 MED ORDER — METHYLPREDNISOLONE SODIUM SUCC 125 MG IJ SOLR
60.0000 mg | Freq: Three times a day (TID) | INTRAMUSCULAR | Status: DC
  Administered 2017-09-30 – 2017-10-02 (×6): 60 mg via INTRAVENOUS
  Filled 2017-09-30 (×6): qty 2

## 2017-09-30 MED ORDER — FUROSEMIDE 10 MG/ML IJ SOLN
80.0000 mg | Freq: Once | INTRAMUSCULAR | Status: AC
  Administered 2017-09-30: 80 mg via INTRAVENOUS
  Filled 2017-09-30: qty 8

## 2017-09-30 MED ORDER — AMLODIPINE BESYLATE 10 MG PO TABS
10.0000 mg | ORAL_TABLET | Freq: Every day | ORAL | Status: DC
  Administered 2017-10-01 – 2017-10-05 (×5): 10 mg via ORAL
  Filled 2017-09-30 (×5): qty 1

## 2017-09-30 MED ORDER — INSULIN ASPART 100 UNIT/ML ~~LOC~~ SOLN
6.0000 [IU] | Freq: Three times a day (TID) | SUBCUTANEOUS | Status: DC
  Administered 2017-09-30: 6 [IU] via SUBCUTANEOUS
  Filled 2017-09-30: qty 1

## 2017-09-30 MED ORDER — METOCLOPRAMIDE HCL 5 MG PO TABS
5.0000 mg | ORAL_TABLET | Freq: Three times a day (TID) | ORAL | Status: DC
  Administered 2017-10-01 – 2017-10-05 (×13): 5 mg via ORAL
  Filled 2017-09-30 (×13): qty 1

## 2017-09-30 MED ORDER — POTASSIUM CHLORIDE IN NACL 40-0.9 MEQ/L-% IV SOLN
INTRAVENOUS | Status: DC
  Administered 2017-09-30 (×2): 100 mL/h via INTRAVENOUS
  Filled 2017-09-30 (×3): qty 1000

## 2017-09-30 MED ORDER — INSULIN ASPART 100 UNIT/ML ~~LOC~~ SOLN
0.0000 [IU] | Freq: Three times a day (TID) | SUBCUTANEOUS | Status: DC
  Administered 2017-09-30: 15 [IU] via SUBCUTANEOUS
  Administered 2017-09-30 – 2017-10-01 (×3): 20 [IU] via SUBCUTANEOUS
  Administered 2017-10-02: 7 [IU] via SUBCUTANEOUS
  Administered 2017-10-02 – 2017-10-03 (×3): 20 [IU] via SUBCUTANEOUS
  Administered 2017-10-03 (×2): 15 [IU] via SUBCUTANEOUS
  Administered 2017-10-04: 4 [IU] via SUBCUTANEOUS
  Administered 2017-10-04: 15 [IU] via SUBCUTANEOUS
  Administered 2017-10-04 – 2017-10-05 (×2): 11 [IU] via SUBCUTANEOUS
  Administered 2017-10-05: 7 [IU] via SUBCUTANEOUS
  Filled 2017-09-30 (×2): qty 1

## 2017-09-30 MED ORDER — METHYLPREDNISOLONE SODIUM SUCC 125 MG IJ SOLR
125.0000 mg | Freq: Once | INTRAMUSCULAR | Status: AC
  Administered 2017-09-30: 125 mg via INTRAVENOUS
  Filled 2017-09-30: qty 2

## 2017-09-30 MED ORDER — FENOFIBRATE 160 MG PO TABS
160.0000 mg | ORAL_TABLET | Freq: Every day | ORAL | Status: DC
  Administered 2017-10-01 – 2017-10-05 (×5): 160 mg via ORAL
  Filled 2017-09-30 (×5): qty 1

## 2017-09-30 MED ORDER — BIOTENE DRY MOUTH MT LIQD: 15.0000 mL | OROMUCOSAL | Status: DC | PRN

## 2017-09-30 NOTE — ED Notes (Signed)
P[orta cath accessed by [facility] rn

## 2017-09-30 NOTE — ED Notes (Signed)
Unable to draw blood from left arm access will notify phlebotomy.

## 2017-09-30 NOTE — ED Notes (Signed)
CBG 471; RN notified 

## 2017-09-30 NOTE — Progress Notes (Signed)
Pt refused ABG.  MD aware.

## 2017-09-30 NOTE — ED Notes (Signed)
Report attempted, Talked to 2W Charge and unable give report until after 1930.

## 2017-09-30 NOTE — ED Notes (Signed)
Pt refusing blood draws  Has a porta-cath wants only that drawn

## 2017-09-30 NOTE — ED Provider Notes (Signed)
[facility] EMERGENCY DEPARTMENT Provider Note   CSN: [id number] Arrival date & time: 09/30/17  0544     History   Chief Complaint Chief Complaint  Patient presents with  . Respiratory Distress    HPI Elizabeth Montes is a 62 y.o. female.  Patient is a 62 year old female with history of COPD, CHF on home oxygen and home nebulizer treatments.  She was brought by EMS for evaluation of respiratory distress.  She has been having increasing difficulty breathing over the past 2 days and this became much worse this morning.  She was found by EMS to be in significant respiratory distress, tachypneic, with accessory muscle use.  She was placed on BiPAP and transported here.  The patient is able to add little history secondary to BiPAP use and acuity of condition.   The history is provided by the patient.    Past Medical History:  Diagnosis Date  . CHF (congestive heart failure) (HCC)   . COPD (chronic obstructive pulmonary disease) (HCC)   . Diabetes mellitus without complication (HCC)   . Heart attack (HCC)   . Hypertension     Patient Active Problem List   Diagnosis Date Noted  . Chronic diastolic CHF (congestive heart failure) (HCC) 09/13/2017  . Diabetes mellitus type 2 in nonobese (HCC) 09/13/2017  . Acute respiratory failure (HCC) 09/13/2017  . COPD exacerbation (HCC) 08/11/2017  . COPD with acute exacerbation (HCC) 08/11/2017  . Acute respiratory failure with hypoxia (HCC) 08/01/2017  . Healthcare-associated pneumonia 08/01/2017  . Diabetes mellitus (HCC) 08/01/2017  . Uncontrolled type 2 diabetes mellitus with hyperosmolar nonketotic hyperglycemia (HCC) 07/16/2017  . CHF, acute on chronic (HCC) 07/16/2017  . Acute on chronic respiratory failure with hypoxia (HCC) 07/16/2017  . COPD (chronic obstructive pulmonary disease) (HCC) 07/16/2017  . Hypertension 07/16/2017  . 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 Acute renal failure superimposed on stage 2 chronic  kidney disease (HCC) 07/16/2017    Past Surgical History:  Procedure Laterality Date  . ABDOMINAL SURGERY    . TRACHEOSTOMY      OB History    No data available       Home Medications    Prior to Admission medications   Medication Sig Start Date End Date Taking? Authorizing Provider  amLODipine (NORVASC) 10 MG tablet Take 1 tablet (10 mg total) by mouth daily. 09/18/17   Darlin Drop, DO  antiseptic oral rinse (BIOTENE) LIQD Take 15 mLs by mouth as needed for dry mouth. 08/27/17   [provider]  aspirin 81 MG EC tablet Take 1 tablet (81 mg total) by mouth daily. 09/18/17   Darlin Drop, DO  azaTHIOprine (IMURAN) 50 MG tablet Take 0.5 tablets (25 mg total) by mouth daily. 09/18/17   Darlin Drop, DO  bisacodyl (DULCOLAX) 5 MG EC tablet Take 1 tablet (5 mg total) by mouth daily as needed for mild constipation. 09/17/17   Darlin Drop, DO  budesonide (PULMICORT) 0.25 MG/2ML nebulizer solution Take 2 mLs (0.25 mg total) by nebulization 2 (two) times daily. 09/17/17   Darlin Drop, DO  fenofibrate 160 MG tablet Take 1 tablet (160 mg total) by mouth daily. 09/18/17   Darlin Drop, DO  furosemide (LASIX) 80 MG tablet Take 1 tablet (80 mg total) by mouth daily. 09/18/17   Darlin Drop, DO  insulin aspart (NOVOLOG FLEXPEN) 100 UNIT/ML FlexPen Inject 20 Units into the skin 3 (three) times daily with meals.  [provider]  liraglutide (VICTOZA) 18 MG/3ML SOPN Inject 1.2 mg into the skin daily. 07/28/17   [provider]  metoCLOPramide (REGLAN) 10 MG tablet Take 5 mg by mouth 3 (three) times daily before meals.    [provider]  metoprolol tartrate (LOPRESSOR) 25 MG tablet Take 1 tablet (25 mg total) by mouth 2 (two) times daily. 09/17/17   Darlin Drop, DO  oxyCODONE-acetaminophen (PERCOCET/ROXICET) 5-325 MG tablet Take 1 tablet by mouth every 4 (four) hours as needed for severe pain.    [provider]  tiotropium (SPIRIVA HANDIHALER)  18 MCG inhalation capsule Place 18 mcg into inhaler and inhale daily as needed (for wheezing).     [provider]    Family History Family History  Family history unknown: Yes    Social History Social History   Tobacco Use  . Smoking status: Never Smoker  . Smokeless tobacco: Never Used  Substance Use Topics  . Alcohol use: No  . Drug use: No     Allergies   Lovenox [enoxaparin sodium]   Review of Systems Review of Systems  Unable to perform ROS: Acuity of condition     Physical Exam Updated Vital Signs There were no vitals taken for this visit.  Physical Exam  Constitutional: She is oriented to person, place, and time. She appears distressed.  Patient is a 62 year old female by EMS in acute respiratory distress.  She appears older than stated age.  HENT:  Head: Normocephalic and atraumatic.  Neck: Normal range of motion. Neck supple.  Cardiovascular: Normal rate and regular rhythm. Exam reveals no gallop and no friction rub.  No murmur heard. Pulmonary/Chest: No stridor. She is in respiratory distress. She has wheezes.  There are rhonchorous breath sounds throughout while the patient is on BiPAP.  She is in tachypneic and in moderate to severe respiratory distress.  Abdominal: Soft. Bowel sounds are normal. She exhibits no distension. There is no tenderness.  Musculoskeletal: Normal range of motion. She exhibits no edema.  Neurological: She is alert and oriented to person, place, and time.  Skin: Skin is warm and dry.  Nursing note and vitals reviewed.    ED Treatments / Results  Labs (all labs ordered are listed, but only abnormal results are displayed) Labs Reviewed  COMPREHENSIVE METABOLIC PANEL  CBC WITH DIFFERENTIAL/PLATELET  BRAIN NATRIURETIC PEPTIDE  BLOOD GAS, ARTERIAL  I-STAT TROPONIN, ED  I-STAT CG4 LACTIC ACID, ED    EKG  EKG Interpretation None       Radiology No results found.  Procedures Procedures (including critical  care time)  Medications Ordered in ED Medications  ipratropium-albuterol (DUONEB) 0.5-2.5 (3) MG/3ML nebulizer solution 3 mL (not administered)  methylPREDNISolone sodium succinate (SOLU-MEDROL) 125 mg/2 mL injection 125 mg (not administered)  furosemide (LASIX) injection 80 mg (not administered)     Initial Impression / Assessment and Plan / ED Course  I have reviewed the triage vital signs and the nursing notes.  Pertinent labs & imaging results that were available during my care of the patient were reviewed by me and considered in my medical decision making (see chart for details).  Patient brought by EMS in moderate to severe respiratory distress.  She has a history of both COPD and CHF.  She required CPAP in route.  Shortly after arriving to the ED, she was placed on BiPAP, given Lasix, steroids, and a DuoNeb.  Her condition significantly improved and she has been weaned to oxygen by 4 L  nasal cannula.  Chest x-ray shows bibasilar atelectasis and possible pneumonia and/or CHF.  I have discussed the case with the hospitalist service who will evaluate and admit.  CRITICAL CARE Performed by: Geoffery Lyons Total critical care time: 35 minutes Critical care time was exclusive of separately billable procedures and treating other patients. Critical care was necessary to treat or prevent imminent or life-threatening deterioration. Critical care was time spent personally by me on the following activities: development of treatment plan with patient and/or surrogate as well as nursing, discussions with consultants, evaluation of patient's response to treatment, examination of patient, obtaining history from patient or surrogate, ordering and performing treatments and interventions, ordering and review of laboratory studies, ordering and review of radiographic studies, pulse oximetry and re-evaluation of patient's condition.   Final Clinical Impressions(s) / ED Diagnoses   Final diagnoses:    None    ED Discharge Orders    None       Geoffery Lyons, MD 09/30/17 [phone]

## 2017-09-30 NOTE — Consult Note (Signed)
Name: Elizabeth Montes MRN: [id number] DOB: 1956/03/11    ADMISSION DATE:  09/30/2017 CONSULTATION DATE: 09/30/2017  REFERRING MD : [facility]  CHIEF COMPLAINT: Dyspnea  BRIEF PATIENT DESCRIPTION: Morbidly obese female who is on 4 L nasal cannula no acute distress  SIGNIFICANT EVENTS  Admitted to [facility] 09/30/2017 for acute exacerbation of COPD Discharge from [facility] 09/17/2017 for acute exacerbation of COPD  STUDIES:  Ct Chest Wo Contrast  Result Date: 09/30/2017 CLINICAL DATA:  Increased shortness of breath. EXAM: CT CHEST WITHOUT CONTRAST TECHNIQUE: Multidetector CT imaging of the chest was performed following the standard protocol without IV contrast. COMPARISON:  CT chest dated August 01, 2017. FINDINGS: Cardiovascular: No significant vascular findings. Normal heart size. No pericardial effusion. Normal caliber thoracic aorta. Coronary, aortic arch, and branch vessel atherosclerotic vascular disease. Mild enlargement of the main pulmonary artery, measuring up to 3.1 cm. Left-sided PICC line with the tip in the proximal SVC. Mediastinum/Nodes: Stable mildly enlarged mediastinal and bilateral hilar lymph nodes, measuring up to 1.5 cm. No axillary lymphadenopathy. The thyroid and esophagus are unremarkable. Lungs/Pleura: Persistent basilar predominant inter and interlobular septal thickening with scattered subpleural ground-glass density, overall similar to prior study. Bibasilar atelectasis. Mild to moderate centrilobular emphysema. No focal consolidation, pleural effusion, or pneumothorax. No suspicious pulmonary nodule. Upper Abdomen: No acute abnormality. Musculoskeletal: No acute or suspicious osseous findings. IMPRESSION: 1. No definite acute intrathoracic process. Chronic changes consistent with interstitial lung disease are overall similar to prior study. 2. Stable mildly enlarged mediastinal and bilateral hilar lymph nodes, favored reactive. 3.  Emphysema (ICD10-J43.9). 4.  Aortic  atherosclerosis (ICD10-I70.0). Electronically Signed   By: Obie Dredge M.D.   On: 09/30/2017 08:50   Dg Chest Port 1 View  Result Date: 09/30/2017 CLINICAL DATA:  Respiratory distress. History of acute and chronic CHF, COPD, acute and chronic renal failure. EXAM: PORTABLE CHEST 1 VIEW COMPARISON:  Portable chest x-ray of September 13, 2017 FINDINGS: The lungs are adequately inflated. The interstitial markings are coarse. There are patchy interstitial densities at both lung bases greatest on the left. There is no pleural effusion. The heart is mildly enlarged. The pulmonary vascularity is mildly prominent centrally. There is calcification in the wall of the aortic arch. The PICC line tip projects over the midportion of the SVC. IMPRESSION: COPD. Bibasilar atelectasis or pneumonia. There may be low-grade CHF. Followup PA and lateral chest X-ray is recommended in 3-4 weeks following trial of antibiotic therapy to ensure resolution and exclude underlying malignancy. Thoracic aortic atherosclerosis. Electronically Signed   By: David  [place] M.D.   On: 09/30/2017 07:10      HISTORY OF PRESENT ILLNESS:   62 year old who has multiple admissions for acute exacerbation of COPD.  She was recently discharged from [facility] on September 17, 2017 for acute exacerbation of COPD.  She had 2D echo at the time that showed relatively normal LV functions.  At the time of examination she is unable to tell me if she smokes or does not smoke.  EMS was activated today 09/30/2017 due to her reported hypoxia with O2 sats 80% on room air.  She was treated with BiPAP oxygen steroids although she has no wheezing at the time of examination.  She carries past medical history of diabetes mellitus type 2 coronary artery disease with a reported acute infarction with stent in 2017 she also is notable for polymyositis, pulmonary fibrosis.  Chart review I see no evidence that she is been evaluated by pulmonary  since her arrival  and read from [place].  She does not appear to have been an infectious state.  She is been treated with steroids oxygen and BiPAP and is non-bronchospastic at the time of my examination.  Her sats are adequate on 4 L nasal cannula.  She is being treated with antimicrobial therapy but CT scan does not support an infectious process.  Note her white count is elevated 17.8 but reportedly she has been on steroids almost consistently over last several months.  Further orders have been reviewed changes have been made pulmonary critical care will be available as needed.  PAST MEDICAL HISTORY :   has a past medical history of COPD (chronic obstructive pulmonary disease) (HCC), Diabetes mellitus without complication (HCC), Heart attack (HCC) (06/09/2016), and Hypertension.  has a past surgical history that includes Tracheostomy and Abdominal surgery. Prior to Admission medications   Medication Sig Start Date End Date Taking? Authorizing Provider  furosemide (LASIX) 80 MG tablet Take 1 tablet (80 mg total) by mouth daily. 09/18/17  Yes Hall, Carole N, DO  insulin aspart (NOVOLOG FLEXPEN) 100 UNIT/ML FlexPen Inject 25 Units into the skin 3 (three) times daily with meals.    Yes [provider]  liraglutide (VICTOZA) 18 MG/3ML SOPN Inject 1.2 mg into the skin daily. 07/28/17  Yes [provider]  amLODipine (NORVASC) 10 MG tablet Take 1 tablet (10 mg total) by mouth daily. 09/18/17   Darlin Drop, DO  antiseptic oral rinse (BIOTENE) LIQD Take 15 mLs by mouth as needed for dry mouth. 08/27/17   [provider]  aspirin 81 MG EC tablet Take 1 tablet (81 mg total) by mouth daily. 09/18/17   Darlin Drop, DO  azaTHIOprine (IMURAN) 50 MG tablet Take 0.5 tablets (25 mg total) by mouth daily. 09/18/17   Darlin Drop, DO  bisacodyl (DULCOLAX) 5 MG EC tablet Take 1 tablet (5 mg total) by mouth daily as needed for mild constipation. 09/17/17   Darlin Drop, DO  budesonide (PULMICORT) 0.25  MG/2ML nebulizer solution Take 2 mLs (0.25 mg total) by nebulization 2 (two) times daily. 09/17/17   Darlin Drop, DO  fenofibrate 160 MG tablet Take 1 tablet (160 mg total) by mouth daily. 09/18/17   Darlin Drop, DO  metoCLOPramide (REGLAN) 10 MG tablet Take 5 mg by mouth 3 (three) times daily before meals.    [provider]  metoprolol tartrate (LOPRESSOR) 25 MG tablet Take 1 tablet (25 mg total) by mouth 2 (two) times daily. 09/17/17   Darlin Drop, DO  oxyCODONE-acetaminophen (PERCOCET/ROXICET) 5-325 MG tablet Take 1 tablet by mouth every 4 (four) hours as needed for severe pain.    [provider]  tiotropium (SPIRIVA HANDIHALER) 18 MCG inhalation capsule Place 18 mcg into inhaler and inhale daily as needed (for wheezing).     [provider]   Allergies  Allergen Reactions  . Lovenox [Enoxaparin Sodium] Other (See Comments)    "made me bleed"    FAMILY HISTORY:  family history includes Cancer in her mother; Diabetes in her sister; Stroke in her brother and sister. SOCIAL HISTORY:  reports that  has never smoked. she has never used smokeless tobacco. She reports that she does not drink alcohol or use drugs.  REVIEW OF SYSTEMS:   Poor historian very little past medical history is available from her.  SUBJECTIVE:  62 year old female is not and acute distress.  VITAL SIGNS: Temp:  [99.5 F (37.5  C)] 99.5 F (37.5 C) (03/07 0849) Pulse Rate:  [104-114] 104 (03/07 1045) Resp:  [22-30] 28 (03/07 1045) BP: (136-154)/(70-79) 148/70 (03/07 1045) SpO2:  [91 %-97 %] 96 % (03/07 1045) FiO2 (%):  [60 %] 60 % (03/07 0555)  PHYSICAL EXAMINATION: General: Obese female is lying in the bed poorly responsive, but does arouse follows commands Neuro: Sleepy but follows commands HEENT: Short neck no JVD appreciated Cardiovascular: Heart sounds are regular regular rate and rhythm Lungs: Decreased air movement throughout faint expiratory wheeze Abdomen: Large,  obese, positive bowel sounds Musculoskeletal: Intact Skin: Warm and dry  Recent Labs  Lab 09/30/17 0623  NA 137  K 2.8*  CL 103  CO2 18*  BUN 11  CREATININE 1.02*  GLUCOSE 213*   Recent Labs  Lab 09/30/17 0623  HGB 13.5  HCT 42.4  WBC 17.8*  PLT 433*   Ct Chest Wo Contrast  Result Date: 09/30/2017 CLINICAL DATA:  Increased shortness of breath. EXAM: CT CHEST WITHOUT CONTRAST TECHNIQUE: Multidetector CT imaging of the chest was performed following the standard protocol without IV contrast. COMPARISON:  CT chest dated August 01, 2017. FINDINGS: Cardiovascular: No significant vascular findings. Normal heart size. No pericardial effusion. Normal caliber thoracic aorta. Coronary, aortic arch, and branch vessel atherosclerotic vascular disease. Mild enlargement of the main pulmonary artery, measuring up to 3.1 cm. Left-sided PICC line with the tip in the proximal SVC. Mediastinum/Nodes: Stable mildly enlarged mediastinal and bilateral hilar lymph nodes, measuring up to 1.5 cm. No axillary lymphadenopathy. The thyroid and esophagus are unremarkable. Lungs/Pleura: Persistent basilar predominant inter and interlobular septal thickening with scattered subpleural ground-glass density, overall similar to prior study. Bibasilar atelectasis. Mild to moderate centrilobular emphysema. No focal consolidation, pleural effusion, or pneumothorax. No suspicious pulmonary nodule. Upper Abdomen: No acute abnormality. Musculoskeletal: No acute or suspicious osseous findings. IMPRESSION: 1. No definite acute intrathoracic process. Chronic changes consistent with interstitial lung disease are overall similar to prior study. 2. Stable mildly enlarged mediastinal and bilateral hilar lymph nodes, favored reactive. 3.  Emphysema (ICD10-J43.9). 4.  Aortic atherosclerosis (ICD10-I70.0). Electronically Signed   By: Obie Dredge M.D.   On: 09/30/2017 08:50   Dg Chest Port 1 View  Result Date: 09/30/2017 CLINICAL DATA:   Respiratory distress. History of acute and chronic CHF, COPD, acute and chronic renal failure. EXAM: PORTABLE CHEST 1 VIEW COMPARISON:  Portable chest x-ray of September 13, 2017 FINDINGS: The lungs are adequately inflated. The interstitial markings are coarse. There are patchy interstitial densities at both lung bases greatest on the left. There is no pleural effusion. The heart is mildly enlarged. The pulmonary vascularity is mildly prominent centrally. There is calcification in the wall of the aortic arch. The PICC line tip projects over the midportion of the SVC. IMPRESSION: COPD. Bibasilar atelectasis or pneumonia. There may be low-grade CHF. Followup PA and lateral chest X-ray is recommended in 3-4 weeks following trial of antibiotic therapy to ensure resolution and exclude underlying malignancy. Thoracic aortic atherosclerosis. Electronically Signed   By: David  [place] M.D.   On: 09/30/2017 07:10   Discussion: 62 year old who has multiple admissions for acute exacerbation of COPD.  She was recently discharged from [facility] on September 17, 2017 for acute exacerbation of COPD.  She had 2D echo at the time that showed relatively normal LV functions.  At the time of examination she is unable to tell me if she smokes or does not smoke.  EMS was activated today 09/30/2017 due to her  reported hypoxia with O2 sats 80% on room air.  She was treated with BiPAP oxygen steroids although she has no wheezing at the time of examination.  She carries past medical history of diabetes mellitus type 2 coronary artery disease with a reported acute infarction with stent in 2017 she also is notable for polymyositis, pulmonary fibrosis.  Chart review I see no evidence that she is been evaluated by pulmonary since her arrival and read from [place].  She does not appear to have been an infectious state.  She is been treated with steroids oxygen and BiPAP and is non-bronchospastic at the time of my examination.   Her sats are adequate on 4 L nasal cannula.  She is being treated with antimicrobial therapy but CT scan does not support an infectious process.  Note her white count is elevated 17.8 but reportedly she has been on steroids almost consistently over last several months.  Further orders have been reviewed changes have been made pulmonary critical care will be available as needed.  ASSESSMENT:   Acute exacerbation of COPD with asthma (HCC)   Type 2 diabetes mellitus, uncontrolled (HCC)   Acute on chronic respiratory failure with hypoxia (HCC)   Hypertension   Polymyositis (HCC)   Pulmonary hypertension (HCC)   CKD stage 2 due to type 2 diabetes mellitus (HCC)    Frequent admissions    Generalized failure to thrive  PLAN:  Agree with oxygen to keep sats greater than 98%   Currently on steroids I suspect she can be rapidly decreased  Diuresis if indicated  Pulmonary will be available as needed.   Brett Canales Minor ACNP [facility] P[facility] Pager [phone] till 1 pm If no answer page [phone] 09/30/2017, 12:40 PM

## 2017-09-30 NOTE — Progress Notes (Addendum)
Inpatient Diabetes Program Recommendations  AACE/ADA: New Consensus Statement on Inpatient Glycemic Control (2015)  Target Ranges:  Prepandial:   less than 140 mg/dL      Peak postprandial:   less than 180 mg/dL (1-2 hours)      Critically ill patients:  140 - 180 mg/dL   Lab Results  Component Value Date   GLUCAP 153 (H) 09/17/2017   HGBA1C 8.5 (H) 09/13/2017   Review of Glycemic Control  Diabetes history: DM 2 Outpatient Diabetes medications: Victoza 1.2 mg Daily, Novolog 25 units tid with meals Current orders for Inpatient glycemic control: Novolog Resistant Correction 0-20 units tid + Novolog HS scale 0-5 units + Novolog 6 units tid meal coverage  A1c 8.5% on 2/18  Inpatient Diabetes Program Recommendations:    Patient use to be on basal insulin (toujeo 25 units) at home previous admissions. Every admission with steroid patient has needed at least Lantus 25 units. Admitting glucose 213 mg/dl.  Patient started on IV Solumedrol 60 mg Q8 hours. Please consider Lantus 16 units (0.2units/kg) while on steroids if glucose is still above inpatient goal.   1200 pm: Attempted to speak with patient. Very lethargic, patient does not want to talk at this time not opening eyes and falling asleep. Will try later.  Thanks,  Christena Deem RN, MSN, BC-ADM, [facility] Inpatient Diabetes Coordinator Team Pager [phone] (8a-5p)

## 2017-09-30 NOTE — ED Triage Notes (Signed)
Brought in by EMS from home with respiratory distress, with headache.  Oxygen sats were 80% on RA at home.  Patient is CAOx4, appropriate.  She wears O2 at home.  Hx CHF and COPD.

## 2017-09-30 NOTE — H&P (Signed)
History and Physical    Elizabeth Montes TRR:[id number] DOB: Jul 22, 1956 DOA: 09/30/2017  PCP: Patient, No Pcp Per Patient coming from: Home via EMS  I have personally briefly reviewed patient's old medical records in [facility]  Chief Complaint: Respiratory distress headache and Short of breath  HPI: Elizabeth Montes is a 61 y.o. female with medical history significant of COPD on home oxygen 6 L nasal cannula, diabetes type 2, coronary artery disease with an acute myocardial infarction with stents in 2017, hypertension, polymyositis, and pulmonary fibrosis who presents in respiratory distress.  Per EMS her sats were 80% on room air at home.  She has a history of 9 admissions to our facility in 6 months.  Reports that she uses daily steroids.  She recently moved here from [place] and has been waiting on Medicaid.  She states that her Medicaid has come through recently.  She states that over the past 2 days she has had worsening respiratory distress.  She has been increasingly short of breath and this became much worse this morning.  She activated EMS and was found to be in significant respiratory distress, she was tachypneic with accessory muscle use.  She was placed on BiPAP by EMS and transported to our facility.  She initially required BiPAP management but since then has been able to be weaned off.  She is currently on 4 L of oxygen lying quietly in the bed.  In the emergency department she was given IV Lasix, 125 mg of Solu-Medrol, and a DuoNeb.  Her condition significantly improved.  Chest x-ray was obtained and shows bibasilar atelectasis and possible pneumonia.  A CT scan of the chest was obtained and showed no definite thoracic process.  More than likely this is an exacerbation of COPD without pneumonia or volume overload.  Please note patient has recently been carrying a diagnosis of diastolic congestive heart failure.  Echocardiogram obtained in December 2018 was completely normal.  She  did have mild pulmonary hypertension at 55 mmHg.  Her ejection fraction was normal.  There was no evidence of diastolic dysfunction.  I have removed this diagnosis from her history.  I discussed this with Dr. Duke Salvia from cardiology.  Patient currently denies fevers chills, nausea, vomiting, area, constipation, palpitations, chest pain, blurry vision, double vision, focal localized weakness, generalized weakness, abdominal pain, difficulty moving any of her limbs.  Review of Systems: As per HPI otherwise all other systems reviewed and  negative.    Past Medical History:  Diagnosis Date  . COPD (chronic obstructive pulmonary disease) (HCC)   . Diabetes mellitus without complication (HCC)   . Heart attack [facility]) 06/09/2016   06/09/2016 cath/PCI (inf STEMI): 60-70% proximal LAD, LCx normal, RCA mid 80-90% -> Rebel 3.5 x 32 mm BMS to mRCA and Rebel 3.5 x 28 mm BMS to proximal LAD   . Hypertension     Past Surgical History:  Procedure Laterality Date  . ABDOMINAL SURGERY    . TRACHEOSTOMY       reports that  has never smoked. she has never used smokeless tobacco. She reports that she does not drink alcohol or use drugs.  Allergies  Allergen Reactions  . Lovenox [Enoxaparin Sodium] Other (See Comments)    "made me bleed"    Family History  Problem Relation Age of Onset  . Cancer Mother   . Stroke Brother   . Diabetes Sister   . Stroke Sister      Prior to Admission medications  Medication Sig Start Date End Date Taking? Authorizing Provider  amLODipine (NORVASC) 10 MG tablet Take 1 tablet (10 mg total) by mouth daily. 09/18/17   Darlin Drop, DO  antiseptic oral rinse (BIOTENE) LIQD Take 15 mLs by mouth as needed for dry mouth. 08/27/17   [provider]  aspirin 81 MG EC tablet Take 1 tablet (81 mg total) by mouth daily. 09/18/17   Darlin Drop, DO  azaTHIOprine (IMURAN) 50 MG tablet Take 0.5 tablets (25 mg total) by mouth daily. 09/18/17   Darlin Drop, DO    bisacodyl (DULCOLAX) 5 MG EC tablet Take 1 tablet (5 mg total) by mouth daily as needed for mild constipation. 09/17/17   Darlin Drop, DO  budesonide (PULMICORT) 0.25 MG/2ML nebulizer solution Take 2 mLs (0.25 mg total) by nebulization 2 (two) times daily. 09/17/17   Darlin Drop, DO  fenofibrate 160 MG tablet Take 1 tablet (160 mg total) by mouth daily. 09/18/17   Darlin Drop, DO  furosemide (LASIX) 80 MG tablet Take 1 tablet (80 mg total) by mouth daily. 09/18/17   Darlin Drop, DO  insulin aspart (NOVOLOG FLEXPEN) 100 UNIT/ML FlexPen Inject 20 Units into the skin 3 (three) times daily with meals.     [provider]  liraglutide (VICTOZA) 18 MG/3ML SOPN Inject 1.2 mg into the skin daily. 07/28/17   [provider]  metoCLOPramide (REGLAN) 10 MG tablet Take 5 mg by mouth 3 (three) times daily before meals.    [provider]  metoprolol tartrate (LOPRESSOR) 25 MG tablet Take 1 tablet (25 mg total) by mouth 2 (two) times daily. 09/17/17   Darlin Drop, DO  oxyCODONE-acetaminophen (PERCOCET/ROXICET) 5-325 MG tablet Take 1 tablet by mouth every 4 (four) hours as needed for severe pain.    [provider]  tiotropium (SPIRIVA HANDIHALER) 18 MCG inhalation capsule Place 18 mcg into inhaler and inhale daily as needed (for wheezing).     [provider]    Physical Exam: Vitals:   09/30/17 0555 09/30/17 0557 09/30/17 0700 09/30/17 0849  BP:  (!) 147/79 138/74   Pulse:   (!) 114   Resp:  (!) 22    Temp:    99.5 F (37.5 C)  TempSrc:    Oral  SpO2: 94% 91% 97%     Constitutional: Lying on the stretcher with increased respiratory rate appears much more calm than on presentation.  Looks 20 years older than her stated age Vitals:   09/30/17 0555 09/30/17 0557 09/30/17 0700 09/30/17 0849  BP:  (!) 147/79 138/74   Pulse:   (!) 114   Resp:  (!) 22    Temp:    99.5 F (37.5 C)  TempSrc:    Oral  SpO2: 94% 91% 97%    Eyes: PERRL, lids and  conjunctivae normal ENMT: Mucous membranes are moist. Posterior pharynx clear of any exudate or lesions.Normal dentition.  Neck: normal, supple, no masses, no thyromegaly Respiratory: Bilateral coarse rhonchi with wheezing and no crackles appreciated.  She has accessory muscle use and is tachypneic.  Cardiovascular: Regular rate and rhythm, no murmurs / rubs / gallops. No extremity edema. 2+ pedal pulses. No carotid bruits.  Abdomen: no tenderness, no masses palpated. No hepatosplenomegaly. Bowel sounds positive.  Musculoskeletal: no clubbing / cyanosis. No joint deformity upper and lower extremities. Good ROM, no contractures. Normal muscle tone.  Skin: no rashes, lesions, ulcers. No induration Neurologic: CN 2-12 grossly intact. Sensation  intact, DTR normal. Strength 5/5 in all 4.  Psychiatric: Normal judgment and insight. Alert and oriented x 3. Normal mood.    Labs on Admission: I have personally reviewed following labs and imaging studies  CBC: Recent Labs  Lab 09/30/17 0623  WBC 17.8*  NEUTROABS 14.5*  HGB 13.5  HCT 42.4  MCV 90.8  PLT 433*   Basic Metabolic Panel: Recent Labs  Lab 09/30/17 0623  NA 137  K 2.8*  CL 103  CO2 18*  GLUCOSE 213*  BUN 11  CREATININE 1.02*  CALCIUM 9.0   GFR: Estimated Creatinine Clearance: 62.3 mL/min (A) (by C-G formula based on SCr of 1.02 mg/dL (H)). Liver Function Tests: Recent Labs  Lab 09/30/17 0623  AST 45*  ALT 36  ALKPHOS 75  BILITOT 0.7  PROT 6.8  ALBUMIN 3.1*   No results for input(s): LIPASE, AMYLASE in the last 168 hours. No results for input(s): AMMONIA in the last 168 hours. Coagulation Profile: No results for input(s): INR, PROTIME in the last 168 hours. Cardiac Enzymes: No results for input(s): CKTOTAL, CKMB, CKMBINDEX, TROPONINI in the last 168 hours. BNP (last 3 results) No results for input(s): PROBNP in the last 8760 hours. HbA1C: No results for input(s): HGBA1C in the last 72 hours. CBG: No results  for input(s): GLUCAP in the last 168 hours. Lipid Profile: No results for input(s): CHOL, HDL, LDLCALC, TRIG, CHOLHDL, LDLDIRECT in the last 72 hours. Thyroid Function Tests: No results for input(s): TSH, T4TOTAL, FREET4, T3FREE, THYROIDAB in the last 72 hours. Anemia Panel: No results for input(s): VITAMINB12, FOLATE, FERRITIN, TIBC, IRON, RETICCTPCT in the last 72 hours. Urine analysis:    Component Value Date/Time   COLORURINE YELLOW 07/16/2017 0526   APPEARANCEUR CLEAR 07/16/2017 0526   LABSPEC 1.010 07/16/2017 0526   PHURINE 5.0 07/16/2017 0526   GLUCOSEU >=500 (A) 07/16/2017 0526   HGBUR NEGATIVE 07/16/2017 0526   BILIRUBINUR NEGATIVE 07/16/2017 0526   KETONESUR NEGATIVE 07/16/2017 0526   PROTEINUR NEGATIVE 07/16/2017 0526   NITRITE NEGATIVE 07/16/2017 0526   LEUKOCYTESUR NEGATIVE 07/16/2017 0526    Radiological Exams on Admission: Ct Chest Wo Contrast  Result Date: 09/30/2017 CLINICAL DATA:  Increased shortness of breath. EXAM: CT CHEST WITHOUT CONTRAST TECHNIQUE: Multidetector CT imaging of the chest was performed following the standard protocol without IV contrast. COMPARISON:  CT chest dated August 01, 2017. FINDINGS: Cardiovascular: No significant vascular findings. Normal heart size. No pericardial effusion. Normal caliber thoracic aorta. Coronary, aortic arch, and branch vessel atherosclerotic vascular disease. Mild enlargement of the main pulmonary artery, measuring up to 3.1 cm. Left-sided PICC line with the tip in the proximal SVC. Mediastinum/Nodes: Stable mildly enlarged mediastinal and bilateral hilar lymph nodes, measuring up to 1.5 cm. No axillary lymphadenopathy. The thyroid and esophagus are unremarkable. Lungs/Pleura: Persistent basilar predominant inter and interlobular septal thickening with scattered subpleural ground-glass density, overall similar to prior study. Bibasilar atelectasis. Mild to moderate centrilobular emphysema. No focal consolidation, pleural  effusion, or pneumothorax. No suspicious pulmonary nodule. Upper Abdomen: No acute abnormality. Musculoskeletal: No acute or suspicious osseous findings. IMPRESSION: 1. No definite acute intrathoracic process. Chronic changes consistent with interstitial lung disease are overall similar to prior study. 2. Stable mildly enlarged mediastinal and bilateral hilar lymph nodes, favored reactive. 3.  Emphysema (ICD10-J43.9). 4.  Aortic atherosclerosis (ICD10-I70.0). Electronically Signed   By: Obie Dredge M.D.   On: 09/30/2017 08:50   Dg Chest Port 1 View  Result Date: 09/30/2017 CLINICAL DATA:  Respiratory distress.  History of acute and chronic CHF, COPD, acute and chronic renal failure. EXAM: PORTABLE CHEST 1 VIEW COMPARISON:  Portable chest x-ray of September 13, 2017 FINDINGS: The lungs are adequately inflated. The interstitial markings are coarse. There are patchy interstitial densities at both lung bases greatest on the left. There is no pleural effusion. The heart is mildly enlarged. The pulmonary vascularity is mildly prominent centrally. There is calcification in the wall of the aortic arch. The PICC line tip projects over the midportion of the SVC. IMPRESSION: COPD. Bibasilar atelectasis or pneumonia. There may be low-grade CHF. Followup PA and lateral chest X-ray is recommended in 3-4 weeks following trial of antibiotic therapy to ensure resolution and exclude underlying malignancy. Thoracic aortic atherosclerosis. Electronically Signed   By: David  [place] M.D.   On: 09/30/2017 07:10    Echocardiogram: 07/16/2017; personally reviewed shows left ventricle with normal cavity size mild concentric left ventricular hypertrophy, normal systolic function, normal ejection fraction 55-60%.  No regional wall motion abnormalities, left ventricular diastolic function parameters were normal.  Pulmonary artery pressure moderately increased.  (55 mmHg)  Assessment/Plan Principal Problem:   Acute exacerbation of  COPD with asthma (HCC) Active Problems:   Acute on chronic respiratory failure with hypoxia (HCC)   Type 2 diabetes mellitus, uncontrolled (HCC)   Pulmonary hypertension (HCC)   Hypertension   Polymyositis (HCC)   CKD stage 2 due to type 2 diabetes mellitus (HCC)   1.  Acute exacerbation of COPD with asthma: Patient with a history of pulmonary fibrosis and moderate pulmonary hypertension.  She has had 9 admissions to our hospital in 6 months most of which have been for pulmonary related issues.  We will admit her in the hospital, continue steroids as ordered in the emergency department at a dose of 60 mg IV every 8 hours, continue nebulizers, continue oxygen treatment.  Noncontrast CT scan of the chest did not show any acute pneumonia or findings consistent with bronchitis.  Patient has had multiple doses of antibiotics recently.  We will hold off on antibiotics for now we will continue to follow her white cell count.  I believe this may be elevated due to daily use of steroids.  Patient has not seen pulmonary since moving to our facility.  I have consulted pulmonary who will see the patient.  It appears that many of her issues have been with difficulty with access.  She has received her Medicaid recently and this should assist in her obtaining treatment.  2.  Acute on chronic respiratory failure with hypoxia: Patient sats were 80% at home.  She required BiPAP treatment on route and in the emergency department.  Fortunately after treatment with steroids and Lasix she has been able to be weaned off the BiPAP.  She is still requiring high flow oxygen at 4-6 L.  We will monitor her closely.  Continue as per Tori therapies.  3.  Type 2 diabetes uncontrolled with chronic kidney disease stage II: Patient's glucoses are very out of control likely related to her steroid use.  Review of her records from her primary care doctor in [place] suggest that she has been having difficulties with her sugars for many  years.  Her last hemoglobin A1c in February 2019 was 8.5.  She may require assistance from diabetes educator.  She is clearly needs to be set up for many services including home health.  She did tell me that home health did come out to her house a couple times after her last  discharge.  We will ask social work to assist with arranging any DME she might require including a glucometer.  Patient uses 3 times daily short acting insulin at home as well as Victoza.  4.  Pulmonary hypertension: Patient with a pulmonary artery pressure of 55 on echocardiogram obtained in December 2018.  Likely this is related to her pulmonary fibrosis.  Will monitor closely.  5.  Hypertension: Continue home medications which include amlodipine, and metoprolol tartrate.  6.  Polymyositis: Patient takes azathioprine daily to control her polymyositis.     DVT prophylaxis: Lovenox: Patient states she has an allergy to this but it is not an allergy apparently when she was on full dose Lovenox she bled.  We will be using subcu prophylaxis dosing Code Status: Full code Family Communication: Family present at the time of admission.  Patient retains capacity. Disposition Plan: Home versus skilled facility pending further evaluation likely in 3 days Consults called: Dr. Sherren Kerns Admission status: Inpatient   Lahoma Crocker MD FACP [facility] Pager [phone]  If 7PM-7AM, please contact night-coverage [email or web address] Password [facility]  09/30/2017, 9:43 AM

## 2017-09-30 NOTE — Progress Notes (Signed)
Per MD request pt has been taken off ob BiPAP satting 92% on 5L.  RT will continue to monitor.

## 2017-09-30 NOTE — ED Notes (Signed)
Moved patient to holding area.  

## 2017-09-30 NOTE — ED Notes (Signed)
Sent notes via MAR to pharmacy to send and verify medications for patient.

## 2017-09-30 NOTE — Progress Notes (Signed)
Pts droplet isolation discontinued. RSV panel negative. Will continue to monitor.

## 2017-10-01 ENCOUNTER — Other Ambulatory Visit: Payer: Self-pay

## 2017-10-01 DIAGNOSIS — R0902 Hypoxemia: Secondary | ICD-10-CM

## 2017-10-01 DIAGNOSIS — I10 Essential (primary) hypertension: Secondary | ICD-10-CM

## 2017-10-01 DIAGNOSIS — E1165 Type 2 diabetes mellitus with hyperglycemia: Secondary | ICD-10-CM

## 2017-10-01 DIAGNOSIS — J9621 Acute and chronic respiratory failure with hypoxia: Secondary | ICD-10-CM

## 2017-10-01 DIAGNOSIS — J810 Acute pulmonary edema: Secondary | ICD-10-CM

## 2017-10-01 DIAGNOSIS — I272 Pulmonary hypertension, unspecified: Secondary | ICD-10-CM

## 2017-10-01 DIAGNOSIS — M3320 Polymyositis, organ involvement unspecified: Secondary | ICD-10-CM

## 2017-10-01 LAB — CBC
HEMATOCRIT: 38 % (ref 36.0–46.0)
Hemoglobin: 12.1 g/dL (ref 12.0–15.0)
MCH: 28.7 pg (ref 26.0–34.0)
MCHC: 31.8 g/dL (ref 30.0–36.0)
MCV: 90 fL (ref 78.0–100.0)
Platelets: 391 10*3/uL (ref 150–400)
RBC: 4.22 MIL/uL (ref 3.87–5.11)
RDW: 15.5 % (ref 11.5–15.5)
WBC: 14.4 10*3/uL — ABNORMAL HIGH (ref 4.0–10.5)

## 2017-10-01 LAB — BASIC METABOLIC PANEL
Anion gap: 11 (ref 5–15)
BUN: 15 mg/dL (ref 6–20)
CHLORIDE: 105 mmol/L (ref 101–111)
CO2: 19 mmol/L — AB (ref 22–32)
Calcium: 8.2 mg/dL — ABNORMAL LOW (ref 8.9–10.3)
Creatinine, Ser: 1.02 mg/dL — ABNORMAL HIGH (ref 0.44–1.00)
GFR calc Af Amer: >60 mL/min (ref >60)
GFR calc non Af Amer: 59 mL/min — ABNORMAL LOW (ref >60)
GLUCOSE: 351 mg/dL — AB (ref 65–99)
POTASSIUM: 4.6 mmol/L (ref 3.5–5.1)
Sodium: 135 mmol/L (ref 135–145)

## 2017-10-01 LAB — GLUCOSE, CAPILLARY
GLUCOSE-CAPILLARY: 389 mg/dL — AB (ref 65–99)
GLUCOSE-CAPILLARY: 447 mg/dL — AB (ref 65–99)
Glucose-Capillary: 401 mg/dL — ABNORMAL HIGH (ref 65–99)
Glucose-Capillary: 472 mg/dL — ABNORMAL HIGH (ref 65–99)
Glucose-Capillary: 445 mg/dL — ABNORMAL HIGH (ref 65–99)
Glucose-Capillary: 379 mg/dL — ABNORMAL HIGH (ref 65–99)

## 2017-10-01 LAB — PROCALCITONIN: Procalcitonin: <0.1 ng/mL

## 2017-10-01 MED ORDER — LEVALBUTEROL HCL 0.63 MG/3ML IN NEBU
0.6300 mg | INHALATION_SOLUTION | Freq: Three times a day (TID) | RESPIRATORY_TRACT | Status: DC
  Administered 2017-10-02 – 2017-10-05 (×9): 0.63 mg via RESPIRATORY_TRACT
  Filled 2017-10-01 (×10): qty 3

## 2017-10-01 MED ORDER — PANTOPRAZOLE SODIUM 40 MG PO TBEC
40.0000 mg | DELAYED_RELEASE_TABLET | Freq: Two times a day (BID) | ORAL | Status: DC
  Administered 2017-10-01 – 2017-10-05 (×8): 40 mg via ORAL
  Filled 2017-10-01 (×8): qty 1

## 2017-10-01 MED ORDER — INSULIN ASPART 100 UNIT/ML ~~LOC~~ SOLN
10.0000 [IU] | Freq: Once | SUBCUTANEOUS | Status: AC
  Administered 2017-10-01: 10 [IU] via SUBCUTANEOUS

## 2017-10-01 MED ORDER — INSULIN ASPART 100 UNIT/ML ~~LOC~~ SOLN
30.0000 [IU] | Freq: Once | SUBCUTANEOUS | Status: AC
  Administered 2017-10-01: 30 [IU] via SUBCUTANEOUS

## 2017-10-01 MED ORDER — SODIUM CHLORIDE 0.9 % IV SOLN: INTRAVENOUS | Status: DC

## 2017-10-01 MED ORDER — INSULIN GLARGINE 100 UNIT/ML ~~LOC~~ SOLN
25.0000 [IU] | Freq: Every day | SUBCUTANEOUS | Status: DC
  Filled 2017-10-01: qty 0.25

## 2017-10-01 MED ORDER — INSULIN GLARGINE 100 UNIT/ML ~~LOC~~ SOLN
10.0000 [IU] | Freq: Once | SUBCUTANEOUS | Status: AC
  Administered 2017-10-01: 10 [IU] via SUBCUTANEOUS
  Filled 2017-10-01: qty 0.1

## 2017-10-01 MED ORDER — LEVALBUTEROL HCL 0.63 MG/3ML IN NEBU
0.6300 mg | INHALATION_SOLUTION | Freq: Three times a day (TID) | RESPIRATORY_TRACT | Status: DC
  Administered 2017-10-01 (×2): 0.63 mg via RESPIRATORY_TRACT
  Filled 2017-10-01 (×2): qty 3

## 2017-10-01 MED ORDER — SODIUM CHLORIDE 0.9% FLUSH
10.0000 mL | Freq: Two times a day (BID) | INTRAVENOUS | Status: DC
  Administered 2017-10-01 – 2017-10-05 (×9): 10 mL

## 2017-10-01 MED ORDER — SODIUM CHLORIDE 0.9 % IV SOLN
INTRAVENOUS | Status: DC
  Administered 2017-10-01 – 2017-10-02 (×2): via INTRAVENOUS

## 2017-10-01 MED ORDER — INSULIN GLARGINE 100 UNIT/ML ~~LOC~~ SOLN
16.0000 [IU] | Freq: Every day | SUBCUTANEOUS | Status: DC
  Administered 2017-10-01: 16 [IU] via SUBCUTANEOUS
  Filled 2017-10-01: qty 0.16

## 2017-10-01 NOTE — Progress Notes (Signed)
OT Cancellation Note  Patient Details Name: Elizabeth Montes MRN: [id number] DOB: 1955/12/28   Cancelled Treatment:    Reason Eval/Treat Not Completed: Patient declined, no reason specified("i dont feel like it" "i feel groggy") Pt reports she does not need OT and that she does everything. OT asking questions about home and pt responds "my daughter does that". Pt declines OT so unable to establish adl level at baseline and unable to evaluate at this time.  Harolyn Rutherford  [phone] 10/01/2017, 12:24 PM

## 2017-10-01 NOTE — Progress Notes (Signed)
Nutrition Consult/Brief Note  RD consulted per COPD Gold Protocol.  Wt Readings from Last 15 Encounters:  09/30/17 188 lb 7.9 oz (85.5 kg)  09/17/17 193 lb 3.2 oz (87.6 kg)  08/11/17 194 lb 7.1 oz (88.2 kg)  08/03/17 192 lb 14.4 oz (87.5 kg)  07/18/17 186 lb 1.6 oz (84.4 kg)   Body mass index is 33.39 kg/m. Patient meets criteria for Obesity Class I based on current BMI.   Current diet order is Heart Healthy/Carbohydrate Modified, patient is consuming approximately 75% of meals at this time. Labs and medications reviewed.   Nutrition focused physical exam completed.  No muscle or subcutaneous fat depletion noticed.  No nutrition interventions warranted at this time. If nutrition issues arise, please consult RD.   Maureen Chatters, RD, LDN Pager #: [phone] After-Hours Pager #: [phone]

## 2017-10-01 NOTE — Progress Notes (Addendum)
Name: Elizabeth Montes MRN: [id number] DOB: 1956/03/11    ADMISSION DATE:  09/30/2017 CONSULTATION DATE: 09/30/2017  REFERRING MD : [facility]  CHIEF COMPLAINT: Dyspnea  BRIEF PATIENT DESCRIPTION: Morbidly obese female who is on 4 L nasal cannula no acute distress  SIGNIFICANT EVENTS  Admitted to [facility] 09/30/2017 for acute exacerbation of COPD Discharge from [facility] 09/17/2017 for acute exacerbation of COPD  STUDIES:  Ct Chest Wo Contrast  Result Date: 09/30/2017 CLINICAL DATA:  Increased shortness of breath. EXAM: CT CHEST WITHOUT CONTRAST TECHNIQUE: Multidetector CT imaging of the chest was performed following the standard protocol without IV contrast. COMPARISON:  CT chest dated August 01, 2017. FINDINGS: Cardiovascular: No significant vascular findings. Normal heart size. No pericardial effusion. Normal caliber thoracic aorta. Coronary, aortic arch, and branch vessel atherosclerotic vascular disease. Mild enlargement of the main pulmonary artery, measuring up to 3.1 cm. Left-sided PICC line with the tip in the proximal SVC. Mediastinum/Nodes: Stable mildly enlarged mediastinal and bilateral hilar lymph nodes, measuring up to 1.5 cm. No axillary lymphadenopathy. The thyroid and esophagus are unremarkable. Lungs/Pleura: Persistent basilar predominant inter and interlobular septal thickening with scattered subpleural ground-glass density, overall similar to prior study. Bibasilar atelectasis. Mild to moderate centrilobular emphysema. No focal consolidation, pleural effusion, or pneumothorax. No suspicious pulmonary nodule. Upper Abdomen: No acute abnormality. Musculoskeletal: No acute or suspicious osseous findings. IMPRESSION: 1. No definite acute intrathoracic process. Chronic changes consistent with interstitial lung disease are overall similar to prior study. 2. Stable mildly enlarged mediastinal and bilateral hilar lymph nodes, favored reactive. 3.  Emphysema (ICD10-J43.9). 4.  Aortic  atherosclerosis (ICD10-I70.0). Electronically Signed   By: Obie Dredge M.D.   On: 09/30/2017 08:50   Dg Chest Port 1 View  Result Date: 09/30/2017 CLINICAL DATA:  Respiratory distress. History of acute and chronic CHF, COPD, acute and chronic renal failure. EXAM: PORTABLE CHEST 1 VIEW COMPARISON:  Portable chest x-ray of September 13, 2017 FINDINGS: The lungs are adequately inflated. The interstitial markings are coarse. There are patchy interstitial densities at both lung bases greatest on the left. There is no pleural effusion. The heart is mildly enlarged. The pulmonary vascularity is mildly prominent centrally. There is calcification in the wall of the aortic arch. The PICC line tip projects over the midportion of the SVC. IMPRESSION: COPD. Bibasilar atelectasis or pneumonia. There may be low-grade CHF. Followup PA and lateral chest X-ray is recommended in 3-4 weeks following trial of antibiotic therapy to ensure resolution and exclude underlying malignancy. Thoracic aortic atherosclerosis. Electronically Signed   By: David  [place] M.D.   On: 09/30/2017 07:10      HISTORY OF PRESENT ILLNESS:   62 year old who has multiple admissions for acute exacerbation of COPD.  She was recently discharged from [facility] on September 17, 2017 for acute exacerbation of COPD.  She had 2D echo at the time that showed relatively normal LV functions.  At the time of examination she is unable to tell me if she smokes or does not smoke.  EMS was activated today 09/30/2017 due to her reported hypoxia with O2 sats 80% on room air.  She was treated with BiPAP oxygen steroids although she has no wheezing at the time of examination.  She carries past medical history of diabetes mellitus type 2 coronary artery disease with a reported acute infarction with stent in 2017 she also is notable for polymyositis, pulmonary fibrosis.  Chart review I see no evidence that she is been evaluated by pulmonary  since her arrival  and read from [place].  She does not appear to have been an infectious state.  She is been treated with steroids oxygen and BiPAP and is non-bronchospastic at the time of my examination.  Her sats are adequate on 4 L nasal cannula.  She is being treated with antimicrobial therapy but CT scan does not support an infectious process.  Note her white count is elevated 17.8 but reportedly she has been on steroids almost consistently over last several months.  Further orders have been reviewed changes have been made pulmonary critical care will be available as needed.  .  SUBJECTIVE:  More awake and alert, reports she goes to the hospital proximal every month month and a half despite being followed by pulmonary doctor in [place].  VITAL SIGNS: Temp:  [97.5 F (36.4 C)-97.9 F (36.6 C)] 97.8 F (36.6 C) (03/08 0903) Pulse Rate:  [89-104] 102 (03/08 0903) Resp:  [18-25] 18 (03/08 0537) BP: (118-149)/(45-92) 126/45 (03/08 0903) SpO2:  [93 %-98 %] 94 % (03/08 0903) FiO2 (%):  [32 %] 32 % (03/08 0735) Weight:  [85.5 kg (188 lb 7.9 oz)] 85.5 kg (188 lb 7.9 oz) (03/07 2051)  PHYSICAL EXAMINATION: General: Overweight, follows commands, no acute distress at rest HEENT: EVD is appreciated t PSY: Normal affect Neuro: Intact CV: Heart sounds are regular PULM: even/non-labored, lungs bilaterally decreased in bases JY:NWGN, non-tender, bsx4 active  Extremities: warm/dry, 1+ edema  Skin: no rashes or lesions  Recent Labs  Lab 09/30/17 0623 10/01/17 0247  NA 137 135  K 2.8* 4.6  CL 103 105  CO2 18* 19*  BUN 11 15  CREATININE 1.02* 1.02*  GLUCOSE 213* 351*   Recent Labs  Lab 09/30/17 0623 10/01/17 0247  HGB 13.5 12.1  HCT 42.4 38.0  WBC 17.8* 14.4*  PLT 433* 391   Ct Chest Wo Contrast  Result Date: 09/30/2017 CLINICAL DATA:  Increased shortness of breath. EXAM: CT CHEST WITHOUT CONTRAST TECHNIQUE: Multidetector CT imaging of the chest was performed following the standard  protocol without IV contrast. COMPARISON:  CT chest dated August 01, 2017. FINDINGS: Cardiovascular: No significant vascular findings. Normal heart size. No pericardial effusion. Normal caliber thoracic aorta. Coronary, aortic arch, and branch vessel atherosclerotic vascular disease. Mild enlargement of the main pulmonary artery, measuring up to 3.1 cm. Left-sided PICC line with the tip in the proximal SVC. Mediastinum/Nodes: Stable mildly enlarged mediastinal and bilateral hilar lymph nodes, measuring up to 1.5 cm. No axillary lymphadenopathy. The thyroid and esophagus are unremarkable. Lungs/Pleura: Persistent basilar predominant inter and interlobular septal thickening with scattered subpleural ground-glass density, overall similar to prior study. Bibasilar atelectasis. Mild to moderate centrilobular emphysema. No focal consolidation, pleural effusion, or pneumothorax. No suspicious pulmonary nodule. Upper Abdomen: No acute abnormality. Musculoskeletal: No acute or suspicious osseous findings. IMPRESSION: 1. No definite acute intrathoracic process. Chronic changes consistent with interstitial lung disease are overall similar to prior study. 2. Stable mildly enlarged mediastinal and bilateral hilar lymph nodes, favored reactive. 3.  Emphysema (ICD10-J43.9). 4.  Aortic atherosclerosis (ICD10-I70.0). Electronically Signed   By: Obie Dredge M.D.   On: 09/30/2017 08:50   Dg Chest Port 1 View  Result Date: 09/30/2017 CLINICAL DATA:  Respiratory distress. History of acute and chronic CHF, COPD, acute and chronic renal failure. EXAM: PORTABLE CHEST 1 VIEW COMPARISON:  Portable chest x-ray of September 13, 2017 FINDINGS: The lungs are adequately inflated. The interstitial markings are coarse. There are patchy interstitial densities at both lung  bases greatest on the left. There is no pleural effusion. The heart is mildly enlarged. The pulmonary vascularity is mildly prominent centrally. There is calcification in the  wall of the aortic arch. The PICC line tip projects over the midportion of the SVC. IMPRESSION: COPD. Bibasilar atelectasis or pneumonia. There may be low-grade CHF. Followup PA and lateral chest X-ray is recommended in 3-4 weeks following trial of antibiotic therapy to ensure resolution and exclude underlying malignancy. Thoracic aortic atherosclerosis. Electronically Signed   By: David  [place] M.D.   On: 09/30/2017 07:10   Discussion: 62 year old who has multiple admissions for acute exacerbation of COPD.  She was recently discharged from [facility] on September 17, 2017 for acute exacerbation of COPD.  She had 2D echo at the time that showed relatively normal LV functions.  At the time of examination she is unable to tell me if she smokes or does not smoke.  EMS was activated today 09/30/2017 due to her reported hypoxia with O2 sats 80% on room air.  She was treated with BiPAP oxygen steroids although she has no wheezing at the time of examination.  She carries past medical history of diabetes mellitus type 2 coronary artery disease with a reported acute infarction with stent in 2017 she also is notable for polymyositis, pulmonary fibrosis.  Chart review I see no evidence that she is been evaluated by pulmonary since her arrival and read from [place].  She does not appear to have been an infectious state.  She is been treated with steroids oxygen and BiPAP and is non-bronchospastic at the time of my examination.  Her sats are adequate on 4 L nasal cannula.  She is being treated with antimicrobial therapy but CT scan does not support an infectious process.  Note her white count is elevated 17.8 but reportedly she has been on steroids almost consistently over last several months.  Further orders have been reviewed changes have been made pulmonary critical care will be available as needed.  ASSESSMENT:   Acute exacerbation of COPD with asthma (HCC)   Type 2 diabetes mellitus, uncontrolled  (HCC)   Acute on chronic respiratory failure with hypoxia (HCC)   Hypertension   Polymyositis (HCC)   Pulmonary hypertension (HCC)   CKD stage 2 due to type 2 diabetes mellitus (HCC)    Frequent admissions    Generalized failure to thrive  PLAN:  Agree with oxygen to keep sats greater than 98%   Currently on steroids I suspect she can be rapidly decreased  Diuresis if indicated  10/01/2017 have added PPI twice daily.  To treat presumed reflux disease.  Consider swallowing evaluation for possible chronic aspiration  Needs to be followed by pulmonary as an outpatient.  He has an appointment made to see Dr. Marchelle Gearing on April 5 at 9:15 AM to establish with a pulmonary practice and [facility].  She reports she goes in the hospital approximately every month to month and a half.  She is done this for last 2-1/2 years.  Reports she was followed by pulmonary doctor in [place] and still required hospitalization every month.    Pulmonary will be available as needed, we will not see on the weekend, please call us if needed.   Brett Canales Minor ACNP Ad[facility] Pollack PCCM Pager [phone] till 1 pm If no answer page [phone] 10/01/2017, 11:36 AM  Attending Note:  62 year old female with COPD and frequent admission presenting now with another exacerbation of COPD.  On exam,  distant BS with minimal wheezing.  I reviewed CXR myself, hyperinflation noted.  Discussed with PCCM-NP.  COPD exacerbation:  - Bronchodilators as ordered  - Steroid taper  - PPI BID  Hypoxemia:  - Titrate O2 for sat of 88-92%  Acute pulmonary edema:  - Lasix as ordered  Appointment made as above.  PCCM will see again on Monday.  Patient seen and examined, agree with above note.  I dictated the care and orders written for this patient under my direction.  Alyson Reedy, MD [phone]

## 2017-10-01 NOTE — Progress Notes (Signed)
PT Cancellation Note  Patient Details Name: Elizabeth Montes MRN: [id number] DOB: 1956-03-28   Cancelled Treatment:    Reason Eval/Treat Not Completed: Patient declined, no reason specified. Pt refusing to participate in PT evaluation or Elizabeth mobility at this time. Pt reporting "I don't want to" "I don't feel like it". PT will continue to f/u with pt as available.    Elizabeth Montes 10/01/2017, 3:19 PM

## 2017-10-01 NOTE — Care Management Note (Addendum)
Case Management Note  Patient Details  Name: Elizabeth Montes MRN: [id number] Date of Birth: 1955-10-20  Subjective/Objective:   Admitted for acute exacerbation of COPD.          Action/Plan:  In to speak with patient.  Prior to admission patient lived at home with daughter.  Will be returning to the same living situation after discharge.  Patient states she has been in [place] a month and a half after living in [place]. Patient states she recently applied for [place] [employer]. Patient daughter does the shopping, cooking (no salt) and takes patient to medical appointments.  Prior to admission patient patient active with [facility] for oxygen (3 Liters).  Active w/ Frances [place]ish ([place]), [facility] RN/Social worker.  Social Worker was scheduled to visit patient today so patient needs new order prior to discharge if still needed. Patient has no PCP since recently moved.  NCM offered to schedule appointment to establish PCP; patient would like NCM to provide some choices & she can make appointment.  NCM provided (2) options for PCP Providers, listed on discharge summary.  Home DME: bedside commode, shower chair.  Patient states she no longer has nebulizer machine, hers broke; also discussed that nebulizer was mentioned on last admission but patient states she never received DME: nebulizer machine.  NCM advised patient will notify attending of need of DME: nebulizer machine.  Patient also states she has history of Polymyositis & having symptoms of unsteady gate related to not receiving IVIG for 4 months.  States she was receiving IVIG x 3 doses every 6 weeks.   Also needs a neurologist and cardiologist in the area.  Patient has the ability to pay for  Medications/food.  At discharge, patient has transportation home.  Expected Discharge Date:   TBD             Expected Discharge Plan:    Home versus skilled facility pending further evaluation   Discharge planning Services   CM Consult  Yancey Flemings, RN  Nurse Case  Manager [place] 10/01/2017, 2:44 PM

## 2017-10-01 NOTE — Progress Notes (Signed)
PROGRESS NOTE  Elizabeth Montes NFA:[id number] DOB: [birth year] DOA: 09/30/2017 PCP: Patient, No Pcp Per  HPI/Recap of past 24 hours: Elizabeth Montes is a 62 y.o. 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 In the ED, pt initially required BiPAP, but was weaned off to [place] O2. Pt was given IV Lasix, Solu-Medrol, and DuoNeb, with significant improvement. CXR showed bibasilar atelectasis and possible pneumonia. CT chest showed no acute thoracic process, but chronic changes consistent with pulmonary fibrosis. Of note, pt has had 9 admissions to our facility in the past 6 months. Pt recently moved here from [place], [place]. Pulmonology consulted. Pt admitted for further management.   Today, pt reported feeling much better, denies worsening SOB, chest pain, fever/chills, N/V, abdominal pain. Pt noted to have elevated CBGs   Assessment/Plan: Principal Problem:   Acute exacerbation of COPD with asthma (HCC) Active Problems:   Type 2 diabetes mellitus, uncontrolled (HCC)   Acute on chronic respiratory failure with hypoxia (HCC)   Hypertension   Polymyositis (HCC)   Pulmonary hypertension (HCC)   CKD stage 2 due to type 2 diabetes mellitus (HCC)  Acute on chronic hypoxic respiratory failure Improving on [place] O2, sat well Likely due to COPD exacerbation Vs progressive pulmonary fibrosis + pulm HTN Afebrile, with leukocytosis (likely due to chronic steroid use) Resp viral panel neg LA down trended to WNL Procalcitonin pending CT chest showed no acute thoracic process, but chronic changes consistent with pulmonary fibrosis Started on IV AB in ED, no BC drawn Will hold off AB for now until procalcitonin level Vs worsening clinical status Continue steroids, nebs, inhalers Monitor closely  ILD- Pulmonary  fibrosis Continue [place] O2, ? Steroids CT Chest as above Pulmonology on board  Pulm HTN Likely due to pulm fibrosis ECHO 12/18 showed: LVEF of 55% to 60%, no regional wall motion abnormalities. Left ventricular diastolic function parameters were normal. PA pressure 55 mmHg Pulmonology on board   Acute exacerbation of COPD Management as above  Type 2 DM with hyperglycemia A1c on 09/13/17 showed 8.5 Uncontrolled due to chronic steroid use Start lantus, SSI, novolog TID If persistent, may need glucostabilizer  Hold home Victoza Diabetes educator, nutritionist on board  HTN Stable Continue amlodipine, metoprolol  Polymyositis Continue azathioprine      Code Status: Full  Family Communication: None at bedside  Disposition Plan: Once pt BS is controlled and breathing back to baseline   Consultants:  Pulmonology  Procedures:  None  Antimicrobials:  S/p IV cefepime  DVT prophylaxis:  Lovenox   Objective: Vitals:   10/01/17 0731 10/01/17 0734 10/01/17 0735 10/01/17 0903  BP:    (!) 126/45  Pulse:    (!) 102  Resp:      Temp:    97.8 F (36.6 C)  TempSrc:    Oral  SpO2: 96% 96% 95% 94%  Weight:      Height:        Intake/Output Summary (Last 24 hours) at 10/01/2017 1031 Last data filed at 10/01/2017 0133 Gross per 24 hour  Intake 1896.67 ml  Output 2 ml  Net 1894.67 ml   Filed Weights   09/30/17 2051  Weight: 85.5 kg (188 lb 7.9 oz)    Exam:   General:  NAD, on [place] O2  Cardiovascular: S1, S2 present  Respiratory: Decreased BS b/l, no wheezing noted  Abdomen: Soft, NT, ND, BS+   Musculoskeletal: No pedal edema bilaterally  Skin: Normal  Psychiatry: Normal mood    Data Reviewed: CBC: Recent Labs  Lab 09/30/17 0623 10/01/17 0247  WBC 17.8* 14.4*  NEUTROABS 14.5*  --   HGB 13.5 12.1  HCT 42.4 38.0  MCV 90.8 90.0  PLT 433* 391   Basic Metabolic Panel: Recent Labs  Lab 09/30/17 0623 10/01/17 0247  NA 137 135  K 2.8* 4.6  CL  103 105  CO2 18* 19*  GLUCOSE 213* 351*  BUN 11 15  CREATININE 1.02* 1.02*  CALCIUM 9.0 8.2*   GFR: Estimated Creatinine Clearance: 61.5 mL/min (A) (by C-G formula based on SCr of 1.02 mg/dL (H)). Liver Function Tests: Recent Labs  Lab 09/30/17 0623  AST 45*  ALT 36  ALKPHOS 75  BILITOT 0.7  PROT 6.8  ALBUMIN 3.1*   No results for input(s): LIPASE, AMYLASE in the last 168 hours. No results for input(s): AMMONIA in the last 168 hours. Coagulation Profile: No results for input(s): INR, PROTIME in the last 168 hours. Cardiac Enzymes: No results for input(s): CKTOTAL, CKMB, CKMBINDEX, TROPONINI in the last 168 hours. BNP (last 3 results) No results for input(s): PROBNP in the last 8760 hours. HbA1C: No results for input(s): HGBA1C in the last 72 hours. CBG: Recent Labs  Lab 09/30/17 1623 09/30/17 1742 09/30/17 2014 09/30/17 2334 10/01/17 0748  GLUCAP 471* 318* 315* 315* 401*   Lipid Profile: No results for input(s): CHOL, HDL, LDLCALC, TRIG, CHOLHDL, LDLDIRECT in the last 72 hours. Thyroid Function Tests: No results for input(s): TSH, T4TOTAL, FREET4, T3FREE, THYROIDAB in the last 72 hours. Anemia Panel: No results for input(s): VITAMINB12, FOLATE, FERRITIN, TIBC, IRON, RETICCTPCT in the last 72 hours. Urine analysis:    Component Value Date/Time   COLORURINE YELLOW 07/16/2017 0526   APPEARANCEUR CLEAR 07/16/2017 0526   LABSPEC 1.010 07/16/2017 0526   PHURINE 5.0 07/16/2017 0526   GLUCOSEU >=500 (A) 07/16/2017 0526   HGBUR NEGATIVE 07/16/2017 0526   BILIRUBINUR NEGATIVE 07/16/2017 0526   KETONESUR NEGATIVE 07/16/2017 0526   PROTEINUR NEGATIVE 07/16/2017 0526   NITRITE NEGATIVE 07/16/2017 0526   LEUKOCYTESUR NEGATIVE 07/16/2017 0526   Sepsis Labs: @LABRCNTIP (procalcitonin:4,lacticidven:4)  ) Recent Results (from the past 240 hour(s))  Respiratory Panel by PCR     Status: None   Collection Time: 09/30/17  8:52 AM  Result Value Ref Range Status    Adenovirus NOT DETECTED NOT DETECTED Final   Coronavirus 229E NOT DETECTED NOT DETECTED Final   Coronavirus HKU1 NOT DETECTED NOT DETECTED Final   Coronavirus NL63 NOT DETECTED NOT DETECTED Final   Coronavirus OC43 NOT DETECTED NOT DETECTED Final   Metapneumovirus NOT DETECTED NOT DETECTED Final   Rhinovirus / Enterovirus NOT DETECTED NOT DETECTED Final   Influenza A NOT DETECTED NOT DETECTED Final   Influenza B NOT DETECTED NOT DETECTED Final   Parainfluenza Virus 1 NOT DETECTED NOT DETECTED Final   Parainfluenza Virus 2 NOT DETECTED NOT DETECTED Final   Parainfluenza Virus 3 NOT DETECTED NOT DETECTED Final   Parainfluenza Virus 4 NOT DETECTED NOT DETECTED Final   Respiratory Syncytial Virus NOT DETECTED NOT DETECTED Final   Bordetella pertussis NOT DETECTED NOT DETECTED Final   Chlamydophila pneumoniae NOT DETECTED NOT DETECTED Final   Mycoplasma pneumoniae NOT DETECTED NOT DETECTED Final      Studies: No results found.  Scheduled Meds: . amLODipine  10 mg Oral  Daily  . aspirin EC  81 mg Oral Daily  . azaTHIOprine  25 mg Oral Daily  . budesonide (PULMICORT) nebulizer solution  0.25 mg Nebulization BID  . enoxaparin (LOVENOX) injection  40 mg Subcutaneous Q24H  . fenofibrate  160 mg Oral Daily  . furosemide  80 mg Oral Daily  . guaiFENesin  600 mg Oral BID  . insulin aspart  0-20 Units Subcutaneous TID WC  . insulin aspart  0-5 Units Subcutaneous QHS  . insulin aspart  30 Units Subcutaneous Once  . insulin aspart  31 Units Subcutaneous TID WC  . insulin glargine  16 Units Subcutaneous Daily  . ipratropium-albuterol  3 mL Nebulization Once  . ipratropium-albuterol  3 mL Nebulization QID  . methylPREDNISolone (SOLU-MEDROL) injection  60 mg Intravenous Q8H  . metoCLOPramide  5 mg Oral TID AC  . metoprolol tartrate  25 mg Oral BID  . sodium chloride flush  10-40 mL Intracatheter Q12H  . sodium chloride flush  3 mL Intravenous Q12H  . tiotropium  18 mcg Inhalation Daily     Continuous Infusions: . sodium chloride    . ceFEPime (MAXIPIME) IV 2 g (10/01/17 3329)     LOS: 1 day     Briant Cedar, MD [facility]   If 7PM-7AM, please contact night-coverage [email or web address] Password [facility] 10/01/2017, 10:31 AM

## 2017-10-01 NOTE — Progress Notes (Signed)
Inpatient Diabetes Program Recommendations  AACE/ADA: New Consensus Statement on Inpatient Glycemic Control (2015)  Target Ranges:  Prepandial:   less than 140 mg/dL      [place]ak postprandial:   less than 180 mg/dL (1-2 hours)      Critically ill patients:  140 - 180 mg/dL   Lab Results  Component Value Date   GLUCAP 389 (H) 10/01/2017   HGBA1C 8.5 (H) 09/13/2017    Review of Glycemic Control  Diabetes history: DM 2 Outpatient Diabetes medications: Toujeo 25 units + Victoza 1.2 mg Daily, Novolog 25 units tid with meals Current orders for Inpatient glycemic control:Lantus 16 units qd + Novolog Resistant Correction 0-20 units tid + Novolog HS scale 0-5 units + Novolog 6 units tid meal coverage  Inpatient Diabetes Program Recommendations:   Spoke with patient @ bedside to clarify insulin medication regimen @ home. Patient is currently on Toujeo 25 units along with Novolog 25 units tid meal coverage. Will update MD and recommend increase in Lantus to 25 units daily.  Thank you, Elizabeth Montes. Ashe Graybeal, RN, MSN, CDE  Diabetes Coordinator Inpatient Glycemic Control Team Team Pager [phone] (8am-5pm) 10/01/2017 2:35 PM

## 2017-10-02 LAB — CBC WITH DIFFERENTIAL/PLATELET
BASOS ABS: 0 10*3/uL (ref 0.0–0.1)
Basophils Relative: 0 %
EOS ABS: 0 10*3/uL (ref 0.0–0.7)
Eosinophils Relative: 0 %
HEMATOCRIT: 35.7 % — AB (ref 36.0–46.0)
HEMOGLOBIN: 11 g/dL — AB (ref 12.0–15.0)
LYMPHS PCT: 5 %
Lymphs Abs: 0.8 10*3/uL (ref 0.7–4.0)
MCH: 28.1 pg (ref 26.0–34.0)
MCHC: 30.8 g/dL (ref 30.0–36.0)
MCV: 91.1 fL (ref 78.0–100.0)
MONOS PCT: 2 %
Monocytes Absolute: 0.3 10*3/uL (ref 0.1–1.0)
NEUTROS PCT: 93 %
Neutro Abs: 14.3 10*3/uL — ABNORMAL HIGH (ref 1.7–7.7)
Platelets: 404 10*3/uL — ABNORMAL HIGH (ref 150–400)
RBC: 3.92 MIL/uL (ref 3.87–5.11)
RDW: 15.8 % — ABNORMAL HIGH (ref 11.5–15.5)
WBC: 15.4 10*3/uL — ABNORMAL HIGH (ref 4.0–10.5)

## 2017-10-02 LAB — BASIC METABOLIC PANEL
Anion gap: 10 (ref 5–15)
BUN: 21 mg/dL — ABNORMAL HIGH (ref 6–20)
CHLORIDE: 105 mmol/L (ref 101–111)
CO2: 21 mmol/L — ABNORMAL LOW (ref 22–32)
Calcium: 7.7 mg/dL — ABNORMAL LOW (ref 8.9–10.3)
Creatinine, Ser: 0.94 mg/dL (ref 0.44–1.00)
GFR calc non Af Amer: >60 mL/min (ref >60)
Glucose, Bld: 402 mg/dL — ABNORMAL HIGH (ref 65–99)
POTASSIUM: 4.2 mmol/L (ref 3.5–5.1)
SODIUM: 136 mmol/L (ref 135–145)

## 2017-10-02 LAB — GLUCOSE, CAPILLARY
GLUCOSE-CAPILLARY: 385 mg/dL — AB (ref 65–99)
GLUCOSE-CAPILLARY: 382 mg/dL — AB (ref 65–99)
Glucose-Capillary: 241 mg/dL — ABNORMAL HIGH (ref 65–99)
Glucose-Capillary: 292 mg/dL — ABNORMAL HIGH (ref 65–99)

## 2017-10-02 LAB — PROCALCITONIN

## 2017-10-02 MED ORDER — METHYLPREDNISOLONE SODIUM SUCC 125 MG IJ SOLR
60.0000 mg | Freq: Two times a day (BID) | INTRAMUSCULAR | Status: DC
  Administered 2017-10-02 – 2017-10-03 (×2): 60 mg via INTRAVENOUS
  Filled 2017-10-02 (×2): qty 2

## 2017-10-02 MED ORDER — INSULIN GLARGINE 100 UNIT/ML ~~LOC~~ SOLN
15.0000 [IU] | Freq: Two times a day (BID) | SUBCUTANEOUS | Status: DC
  Administered 2017-10-02 – 2017-10-05 (×7): 15 [IU] via SUBCUTANEOUS
  Filled 2017-10-02 (×7): qty 0.15

## 2017-10-02 MED ORDER — INSULIN ASPART 100 UNIT/ML ~~LOC~~ SOLN
35.0000 [IU] | Freq: Three times a day (TID) | SUBCUTANEOUS | Status: DC
  Administered 2017-10-02 – 2017-10-05 (×9): 35 [IU] via SUBCUTANEOUS

## 2017-10-02 NOTE — Progress Notes (Signed)
PROGRESS NOTE  Elizabeth Montes IDP:[id number] DOB: Feb 09, 1956 DOA: 09/30/2017 PCP: Patient, No Pcp Per  HPI/Recap of past 24 hours: Elizabeth Montes is a 62 y.o. 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 In the ED, pt initially required BiPAP, but was weaned off to [place] O2. Pt was given IV Lasix, Solu-Medrol, and DuoNeb, with significant improvement. CXR showed bibasilar atelectasis and possible pneumonia. CT chest showed no acute thoracic process, but chronic changes consistent with pulmonary fibrosis. Of note, pt has had 9 admissions to our facility in the past 6 months. Pt recently moved here from [place], [place]. Pulmonology consulted. Pt admitted for further management.   Today, met pt sleeping, easily arousable, reported she didn't get enough sleep last night. Feeling much better, denies worsening SOB, chest pain, fever/chills, N/V, abdominal pain.   Assessment/Plan: Principal Problem:   Acute exacerbation of COPD with asthma ([place]) Active Problems:   Type 2 diabetes mellitus, uncontrolled ([place])   Acute on chronic respiratory failure with hypoxia (HCC)   Hypertension   Polymyositis (HCC)   Pulmonary hypertension (HCC)   CKD stage 2 due to type 2 diabetes mellitus (HCC)   Hypoxemia   Acute pulmonary edema (HCC)  Acute on chronic hypoxic respiratory failure Improving on [place] O2, sat well Likely due to COPD exacerbation Vs progressive pulmonary fibrosis + pulm HTN Afebrile, with leukocytosis (likely due to chronic steroid use) Resp viral panel neg LA down trended to WNL Procalcitonin <0.10, unlikely PNA CT chest showed no acute thoracic process, but chronic changes consistent with pulmonary fibrosis Started on IV AB in ED, no BC drawn, no need for IV AB Taper steroids, continue nebs,  inhalers Monitor closely  ILD- Pulmonary fibrosis Continue [place] O2 CT Chest as above Pulmonology on board, outpt appt set up with Dr Chase Caller on April 5th  Pulm HTN Likely due to pulm fibrosis ECHO 12/18 showed: LVEF of 55% to 60%, no regional wall motion abnormalities. Left ventricular diastolic function parameters were normal. PA pressure 55 mmHg Pulmonology on board   Acute exacerbation of COPD Management as above  Type 2 DM with hyperglycemia A1c on 09/13/17 showed 8.5 Uncontrolled due to chronic steroid use Start lantus, SSI, novolog TID, adjust accordingly If persistent, may need glucostabilizer  Hold home Victoza Diabetes educator, nutritionist on board  HTN Stable Continue amlodipine, metoprolol  Polymyositis Continue azathioprine Patient reports taking IVIG X 3 doses every 6 weeks given by her neurologist in [place] Plan to set up an outpt neurologist to follow up on this     Code Status: Full  Family Communication: None at bedside  Disposition Plan: Once pt BS is controlled and breathing back to baseline   Consultants:  Pulmonology  Procedures:  None  Antimicrobials:  S/p IV cefepime  DVT prophylaxis:  Lovenox   Objective: Vitals:   10/02/17 0500 10/02/17 0731 10/02/17 1204 10/02/17 1425  BP: (!) 132/57 (!) 149/65 (!) 138/59   Pulse: 89 83 81   Resp: _0 Temp: 98.4 F (36.9 C) 98.4 F (36.9 C) 98.4 F (36.9 C)   TempSrc: Oral Oral Oral   SpO2: 98% 98% 94% 96%  Weight:      Height:        Intake/Output Summary (Last 24 hours) at 10/02/2017 1504 Last data filed  at 10/02/2017 1422 Gross per 24 hour  Intake 1965 ml  Output 2076 ml  Net -111 ml   Filed Weights   09/30/17 2051  Weight: 85.5 kg (188 lb 7.9 oz)    Exam:   General:  NAD, on [place] O2  Cardiovascular: S1, S2 present  Respiratory: Decreased BS b/l, no wheezing noted  Abdomen: Soft, NT, ND, BS+   Musculoskeletal: No pedal edema bilaterally  Skin:  Normal  Psychiatry: Normal mood    Data Reviewed: CBC: Recent Labs  Lab 09/30/17 0623 10/01/17 0247 10/02/17 0559  WBC 17.8* 14.4* 15.4*  NEUTROABS 14.5*  --  14.3*  HGB 13.5 12.1 11.0*  HCT 42.4 38.0 35.7*  MCV 90.8 90.0 91.1  PLT 433* 391 404*   Basic Metabolic Panel: Recent Labs  Lab 09/30/17 0623 10/01/17 0247 10/02/17 0559  NA 137 135 136  K 2.8* 4.6 4.2  CL 103 105 105  CO2 18* 19* 21*  GLUCOSE 213* 351* 402*  BUN 11 15 21*  CREATININE 1.02* 1.02* 0.94  CALCIUM 9.0 8.2* 7.7*   GFR: Estimated Creatinine Clearance: 66.7 mL/min (by C-G formula based on SCr of 0.94 mg/dL). Liver Function Tests: Recent Labs  Lab 09/30/17 0623  AST 45*  ALT 36  ALKPHOS 75  BILITOT 0.7  PROT 6.8  ALBUMIN 3.1*   No results for input(s): LIPASE, AMYLASE in the last 168 hours. No results for input(s): AMMONIA in the last 168 hours. Coagulation Profile: No results for input(s): INR, PROTIME in the last 168 hours. Cardiac Enzymes: No results for input(s): CKTOTAL, CKMB, CKMBINDEX, TROPONINI in the last 168 hours. BNP (last 3 results) No results for input(s): PROBNP in the last 8760 hours. HbA1C: No results for input(s): HGBA1C in the last 72 hours. CBG: Recent Labs  Lab 10/01/17 1204 10/01/17 1625 10/01/17 2125 10/02/17 0731 10/02/17 1201  GLUCAP 389* 379* 447* 385* 382*   Lipid Profile: No results for input(s): CHOL, HDL, LDLCALC, TRIG, CHOLHDL, LDLDIRECT in the last 72 hours. Thyroid Function Tests: No results for input(s): TSH, T4TOTAL, FREET4, T3FREE, THYROIDAB in the last 72 hours. Anemia Panel: No results for input(s): VITAMINB12, FOLATE, FERRITIN, TIBC, IRON, RETICCTPCT in the last 72 hours. Urine analysis:    Component Value Date/Time   COLORURINE YELLOW 07/16/2017 0526   APPEARANCEUR CLEAR 07/16/2017 0526   LABSPEC 1.010 07/16/2017 0526   PHURINE 5.0 07/16/2017 0526   GLUCOSEU >=500 (A) 07/16/2017 0526   HGBUR NEGATIVE 07/16/2017 0526   BILIRUBINUR  NEGATIVE 07/16/2017 0526   KETONESUR NEGATIVE 07/16/2017 0526   PROTEINUR NEGATIVE 07/16/2017 0526   NITRITE NEGATIVE 07/16/2017 0526   LEUKOCYTESUR NEGATIVE 07/16/2017 0526   Sepsis Labs: @LABRCNTIP(procalcitonin:4,lacticidven:4)  ) Recent Results (from the past 240 hour(s))  Respiratory Panel by PCR     Status: None   Collection Time: 09/30/17  8:52 AM  Result Value Ref Range Status   Adenovirus NOT DETECTED NOT DETECTED Final   Coronavirus 229E NOT DETECTED NOT DETECTED Final   Coronavirus HKU1 NOT DETECTED NOT DETECTED Final   Coronavirus NL63 NOT DETECTED NOT DETECTED Final   Coronavirus OC43 NOT DETECTED NOT DETECTED Final   Metapneumovirus NOT DETECTED NOT DETECTED Final   Rhinovirus / Enterovirus NOT DETECTED NOT DETECTED Final   Influenza A NOT DETECTED NOT DETECTED Final   Influenza B NOT DETECTED NOT DETECTED Final   Parainfluenza Virus 1 NOT DETECTED NOT DETECTED Final   Parainfluenza Virus 2 NOT DETECTED NOT DETECTED Final   Parainfluenza Virus 3 NOT   DETECTED NOT DETECTED Final   Parainfluenza Virus 4 NOT DETECTED NOT DETECTED Final   Respiratory Syncytial Virus NOT DETECTED NOT DETECTED Final   Bordetella pertussis NOT DETECTED NOT DETECTED Final   Chlamydophila pneumoniae NOT DETECTED NOT DETECTED Final   Mycoplasma pneumoniae NOT DETECTED NOT DETECTED Final      Studies: No results found.  Scheduled Meds: . amLODipine  10 mg Oral Daily  . aspirin EC  81 mg Oral Daily  . azaTHIOprine  25 mg Oral Daily  . budesonide (PULMICORT) nebulizer solution  0.25 mg Nebulization BID  . enoxaparin (LOVENOX) injection  40 mg Subcutaneous Q24H  . fenofibrate  160 mg Oral Daily  . furosemide  80 mg Oral Daily  . guaiFENesin  600 mg Oral BID  . insulin aspart  0-20 Units Subcutaneous TID WC  . insulin aspart  0-5 Units Subcutaneous QHS  . insulin aspart  31 Units Subcutaneous TID WC  . insulin glargine  15 Units Subcutaneous BID  . levalbuterol  0.63 mg Nebulization  TID  . methylPREDNISolone (SOLU-MEDROL) injection  60 mg Intravenous Q12H  . metoCLOPramide  5 mg Oral TID AC  . metoprolol tartrate  25 mg Oral BID  . pantoprazole  40 mg Oral BID AC  . sodium chloride flush  10-40 mL Intracatheter Q12H  . sodium chloride flush  3 mL Intravenous Q12H  . tiotropium  18 mcg Inhalation Daily    Continuous Infusions:    LOS: 2 days     Alma Friendly, MD [facility]   If 7PM-7AM, please contact night-coverage [email or web address] Password [email or web address] 10/02/2017, 3:04 PM

## 2017-10-02 NOTE — Evaluation (Signed)
Physical Therapy Evaluation Patient Details Name: Elizabeth Montes MRN: [id number] DOB: Dec 14, 1955 Today's Date: 10/02/2017   History of Present Illness  Pt is a 62 y/o female female with medical history significant of COPD on home oxygen 6 L nasal cannula, diabetes type 2, coronary artery disease with an acute myocardial infarction with stents in 2017, hypertension, polymyositis, and pulmonary fibrosis who presents in respiratory distress.  Per EMS her sats were 80% on room air at home.  She has a history of 9 admissions to our facility in 6 months. Pt admitted with acute COPD exacerbation.    Clinical Impression  Pt presented supine in bed with HOB elevated, awake and willing to participate in therapy session. Prior to admission, pt reported that she is independent with all functional mobility and ADLs. Pt currently at modified independence with bed mobility and transfers. She ambulated in hallway without use of an AD with supervision for safety on 4-6L of O2. Pt's SPO2 decreasing to as low as 73% with activity on 4-6L of O2 with slow recover to >90% after several minutes with pursed-lip breathing. Pt reported that she feels she is at her baseline in regards to functional mobility. She reported that she can tell when her SPO2 drops and rests as needed. No further acute PT needs identified at this time. PT signing off.     Follow Up Recommendations No PT follow up    Equipment Recommendations  None recommended by PT    Recommendations for Other Services       Precautions / Restrictions Precautions Precautions: Other (comment) Precaution Comments: monitor SPO2 Restrictions Weight Bearing Restrictions: No      Mobility  Bed Mobility Overal bed mobility: Modified Independent                Transfers Overall transfer level: Modified independent Equipment used: None                Ambulation/Gait Ambulation/Gait assistance: Supervision Ambulation Distance (Feet): 200  Feet(200' x2 with standing rest break x5 minutes) Assistive device: None Gait Pattern/deviations: Step-through pattern Gait velocity: decreased Gait velocity interpretation: Below normal speed for age/gender General Gait Details: steady gait without use of an AD; pt on 4-6L of O2 with SPO2 decreasing to as low as 73% with slow recovery to 90% after several minutes of pursed-lip breathing and rest break  Stairs            Wheelchair Mobility    Modified Rankin (Stroke Patients Only)       Balance Overall balance assessment: Needs assistance Sitting-balance support: Feet supported Sitting balance-Leahy Scale: Good     Standing balance support: During functional activity;No upper extremity supported Standing balance-Leahy Scale: Good                               Pertinent Vitals/Pain Pain Assessment: No/denies pain    Home Living Family/patient expects to be discharged to:: Private residence Living Arrangements: Children Available Help at Discharge: Family;Available PRN/intermittently Type of Home: Apartment Home Access: Level entry     Home Layout: Two level;Able to live on main level with bedroom/bathroom;Bed/bath upstairs Home Equipment: Emergency planning/management officer - 2 wheels;Cane - single point;Wheelchair - [employer];Hospital bed Additional Comments: pt on 3L of O2 at all times    Prior Function Level of Independence: Independent               Hand Dominance  Extremity/Trunk Assessment   Upper Extremity Assessment Upper Extremity Assessment: Defer to OT evaluation    Lower Extremity Assessment Lower Extremity Assessment: Overall WFL for tasks assessed       Communication   Communication: No difficulties  Cognition Arousal/Alertness: Awake/alert Behavior During Therapy: WFL for tasks assessed/performed;Flat affect Overall Cognitive Status: Within Functional Limits for tasks assessed                                         General Comments      Exercises     Assessment/Plan    PT Assessment Patent does not need any further PT services  PT Problem List         PT Treatment Interventions      PT Goals (Current goals can be found in the Care Plan section)  Acute Rehab PT Goals Patient Stated Goal: return home    Frequency     Barriers to discharge        Co-evaluation               AM-PAC PT "6 Clicks" Daily Activity  Outcome Measure Difficulty turning over in bed (including adjusting bedclothes, sheets and blankets)?: None Difficulty moving from lying on back to sitting on the side of the bed? : None Difficulty sitting down on and standing up from a chair with arms (e.g., wheelchair, bedside commode, etc,.)?: None Help needed moving to and from a bed to chair (including a wheelchair)?: None Help needed walking in hospital room?: None Help needed climbing 3-5 steps with a railing? : A Little 6 Click Score: 23    End of Session Equipment Utilized During Treatment: Oxygen(4-6L) Activity Tolerance: Patient limited by fatigue Patient left: in bed;with call bell/phone within reach Nurse Communication: Mobility status PT Visit Diagnosis: Other abnormalities of gait and mobility (R26.89)    Time: 1031-2811 PT Time Calculation (min) (ACUTE ONLY): 24 min   Charges:   PT Evaluation $PT Eval Low Complexity: 1 Low PT Treatments $Therapeutic Activity: 8-22 mins   PT G Codes:        Elizabeth Montes, PT, DPT [phone]   Elizabeth Montes 10/02/2017, 3:50 PM

## 2017-10-03 LAB — BASIC METABOLIC PANEL
ANION GAP: 12 (ref 5–15)
BUN: 23 mg/dL — ABNORMAL HIGH (ref 6–20)
CALCIUM: 8 mg/dL — AB (ref 8.9–10.3)
CHLORIDE: 101 mmol/L (ref 101–111)
CO2: 22 mmol/L (ref 22–32)
Creatinine, Ser: 0.96 mg/dL (ref 0.44–1.00)
GFR calc non Af Amer: >60 mL/min (ref >60)
GLUCOSE: 358 mg/dL — AB (ref 65–99)
POTASSIUM: 3.8 mmol/L (ref 3.5–5.1)
Sodium: 135 mmol/L (ref 135–145)

## 2017-10-03 LAB — CBC WITH DIFFERENTIAL/PLATELET
BASOS PCT: 0 %
Basophils Absolute: 0 10*3/uL (ref 0.0–0.1)
Eosinophils Absolute: 0 10*3/uL (ref 0.0–0.7)
Eosinophils Relative: 0 %
HEMATOCRIT: 36.9 % (ref 36.0–46.0)
HEMOGLOBIN: 11.6 g/dL — AB (ref 12.0–15.0)
LYMPHS PCT: 5 %
Lymphs Abs: 0.7 10*3/uL (ref 0.7–4.0)
MCH: 28.6 pg (ref 26.0–34.0)
MCHC: 31.4 g/dL (ref 30.0–36.0)
MCV: 90.9 fL (ref 78.0–100.0)
MONO ABS: 0.4 10*3/uL (ref 0.1–1.0)
Monocytes Relative: 3 %
NEUTROS ABS: 13.9 10*3/uL — AB (ref 1.7–7.7)
NEUTROS PCT: 92 %
Platelets: 409 10*3/uL — ABNORMAL HIGH (ref 150–400)
RBC: 4.06 MIL/uL (ref 3.87–5.11)
RDW: 15.8 % — AB (ref 11.5–15.5)
WBC: 15.1 10*3/uL — ABNORMAL HIGH (ref 4.0–10.5)

## 2017-10-03 LAB — GLUCOSE, CAPILLARY
GLUCOSE-CAPILLARY: 353 mg/dL — AB (ref 65–99)
Glucose-Capillary: 320 mg/dL — ABNORMAL HIGH (ref 65–99)
Glucose-Capillary: 309 mg/dL — ABNORMAL HIGH (ref 65–99)
Glucose-Capillary: 205 mg/dL — ABNORMAL HIGH (ref 65–99)

## 2017-10-03 MED ORDER — METHYLPREDNISOLONE SODIUM SUCC 40 MG IJ SOLR
40.0000 mg | Freq: Two times a day (BID) | INTRAMUSCULAR | Status: DC
  Administered 2017-10-03 – 2017-10-05 (×4): 40 mg via INTRAVENOUS
  Filled 2017-10-03 (×4): qty 1

## 2017-10-03 NOTE — Progress Notes (Signed)
PROGRESS NOTE  SHAUNDRA ROZEN KKX:[id number] DOB: Sep 21, 1955 DOA: 09/30/2017 PCP: Patient, No Pcp Per  HPI/Recap of past 24 hours: NETHA NAPOLEONE is a 62 y.o. 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 In the ED, pt initially required BiPAP, but was weaned off to [place] O2. Pt was given IV Lasix, Solu-Medrol, and DuoNeb, with significant improvement. CXR showed bibasilar atelectasis and possible pneumonia. CT chest showed no acute thoracic process, but chronic changes consistent with pulmonary fibrosis. Of note, pt has had 9 admissions to our facility in the past 6 months. Pt recently moved here from [place], [place]. Pulmonology consulted. Pt admitted for further management.   Today, pt reported feeling much better, almost back to her baseline. Denies worsening SOB, chest pain, fever/chills, N/V, abdominal pain. Encouraged to ambulate.   Assessment/Plan: Principal Problem:   Acute exacerbation of COPD with asthma ([facility]) Active Problems:   Type 2 diabetes mellitus, uncontrolled ([facility])   Acute on chronic respiratory failure with hypoxia (HCC)   Hypertension   Polymyositis (HCC)   Pulmonary hypertension (HCC)   CKD stage 2 due to type 2 diabetes mellitus (HCC)   Hypoxemia   Acute pulmonary edema (H[facility])  Acute on chronic hypoxic respiratory failure Improving on [place] O2, sat well Likely due to COPD exacerbation Vs progressive pulmonary fibrosis + pulm HTN Afebrile, with leukocytosis (likely due to chronic steroid use) Resp viral panel neg LA down trended to WNL Procalcitonin <0.10, unlikely PNA CT chest showed no acute thoracic process, but chronic changes consistent with pulmonary fibrosis Started on IV AB in ED, no BC drawn, no need for IV AB Taper steroids, continue nebs, inhalers Monitor  closely  ILD- Pulmonary fibrosis Continue [place] O2 CT Chest as above Pulmonology on board, outpt appt set up with Dr Marchelle Gearing on April 5th  Pulm HTN Likely due to pulm fibrosis ECHO 12/18 showed: LVEF of 55% to 60%, no regional wall motion abnormalities. Left ventricular diastolic function parameters were normal. PA pressure 55 mmHg Pulmonology on board   Acute exacerbation of COPD Management as above  Type 2 DM with hyperglycemia A1c on 09/13/17 showed 8.5 Uncontrolled due to chronic steroid use Start lantus, SSI, novolog TID, adjust accordingly Hold home Victoza Diabetes educator, nutritionist on board  HTN Stable Continue amlodipine, metoprolol  Polymyositis Continue azathioprine Patient reports taking IVIG X 3 doses every 6 weeks given by her neurologist in [place] Plan to set up an outpt neurologist/rheumatologis to follow up on this     Code Status: Full  Family Communication: None at bedside  Disposition Plan: Once pt BS is controlled and breathing back to baseline   Consultants:  Pulmonology  Procedures:  None  Antimicrobials:  S/p IV cefepime, took for only 1 day  DVT prophylaxis:  Lovenox   Objective: Vitals:   10/03/17 0520 10/03/17 0735 10/03/17 0750 10/03/17 0755  BP: (!) 143/67  (!) 141/69 (!) 141/69  Pulse: 75  79 79  Resp: 20   20  Temp: 97.7 F (36.5 C)   (!) 97.4 F (36.3 C)  TempSrc: Oral   Oral  SpO2: 97% 97%  96%  Weight:      Height:        Intake/Output Summary (Last 24 hours) at 10/03/2017 1021 Last data filed at 10/03/2017 0905 Gross per 24  hour  Intake 610 ml  Output 1941 ml  Net -1331 ml   Filed Weights   09/30/17 2051  Weight: 85.5 kg (188 lb 7.9 oz)    Exam:   General:  NAD, on [place] O2  Cardiovascular: S1, S2 present  Respiratory: Decreased BS b/l, no wheezing noted  Abdomen: Soft, NT, ND, BS+   Musculoskeletal: No pedal edema bilaterally  Skin: Normal  Psychiatry: Normal mood    Data  Reviewed: CBC: Recent Labs  Lab 09/30/17 0623 10/01/17 0247 10/02/17 0559 10/03/17 0844  WBC 17.8* 14.4* 15.4* 15.1*  NEUTROABS 14.5*  --  14.3* 13.9*  HGB 13.5 12.1 11.0* 11.6*  HCT 42.4 38.0 35.7* 36.9  MCV 90.8 90.0 91.1 90.9  PLT 433* 391 404* 409*   Basic Metabolic Panel: Recent Labs  Lab 09/30/17 0623 10/01/17 0247 10/02/17 0559 10/03/17 0844  NA 137 135 136 135  K 2.8* 4.6 4.2 3.8  CL 103 105 105 101  CO2 18* 19* 21* 22  GLUCOSE 213* 351* 402* 358*  BUN 11 15 21* 23*  CREATININE 1.02* 1.02* 0.94 0.96  CALCIUM 9.0 8.2* 7.7* 8.0*   GFR: Estimated Creatinine Clearance: 65.3 mL/min (by C-G formula based on SCr of 0.96 mg/dL). Liver Function Tests: Recent Labs  Lab 09/30/17 0623  AST 45*  ALT 36  ALKPHOS 75  BILITOT 0.7  PROT 6.8  ALBUMIN 3.1*   No results for input(s): LIPASE, AMYLASE in the last 168 hours. No results for input(s): AMMONIA in the last 168 hours. Coagulation Profile: No results for input(s): INR, PROTIME in the last 168 hours. Cardiac Enzymes: No results for input(s): CKTOTAL, CKMB, CKMBINDEX, TROPONINI in the last 168 hours. BNP (last 3 results) No results for input(s): PROBNP in the last 8760 hours. HbA1C: No results for input(s): HGBA1C in the last 72 hours. CBG: Recent Labs  Lab 10/02/17 0731 10/02/17 1201 10/02/17 1627 10/02/17 2051 10/03/17 0721  GLUCAP 385* 382* 241* 292* 320*   Lipid Profile: No results for input(s): CHOL, HDL, LDLCALC, TRIG, CHOLHDL, LDLDIRECT in the last 72 hours. Thyroid Function Tests: No results for input(s): TSH, T4TOTAL, FREET4, T3FREE, THYROIDAB in the last 72 hours. Anemia Panel: No results for input(s): VITAMINB12, FOLATE, FERRITIN, TIBC, IRON, RETICCTPCT in the last 72 hours. Urine analysis:    Component Value Date/Time   COLORURINE YELLOW 07/16/2017 0526   APPEARANCEUR CLEAR 07/16/2017 0526   LABSPEC 1.010 07/16/2017 0526   PHURINE 5.0 07/16/2017 0526   GLUCOSEU >=500 (A) 07/16/2017  0526   HGBUR NEGATIVE 07/16/2017 0526   BILIRUBINUR NEGATIVE 07/16/2017 0526   KETONESUR NEGATIVE 07/16/2017 0526   PROTEINUR NEGATIVE 07/16/2017 0526   NITRITE NEGATIVE 07/16/2017 0526   LEUKOCYTESUR NEGATIVE 07/16/2017 0526   Sepsis Labs: @LABRCNTIP (procalcitonin:4,lacticidven:4)  ) Recent Results (from the past 240 hour(s))  Respiratory Panel by PCR     Status: None   Collection Time: 09/30/17  8:52 AM  Result Value Ref Range Status   Adenovirus NOT DETECTED NOT DETECTED Final   Coronavirus 229E NOT DETECTED NOT DETECTED Final   Coronavirus HKU1 NOT DETECTED NOT DETECTED Final   Coronavirus NL63 NOT DETECTED NOT DETECTED Final   Coronavirus OC43 NOT DETECTED NOT DETECTED Final   Metapneumovirus NOT DETECTED NOT DETECTED Final   Rhinovirus / Enterovirus NOT DETECTED NOT DETECTED Final   Influenza A NOT DETECTED NOT DETECTED Final   Influenza B NOT DETECTED NOT DETECTED Final   Parainfluenza Virus 1 NOT DETECTED NOT DETECTED Final   Parainfluenza Virus  2 NOT DETECTED NOT DETECTED Final   Parainfluenza Virus 3 NOT DETECTED NOT DETECTED Final   Parainfluenza Virus 4 NOT DETECTED NOT DETECTED Final   Respiratory Syncytial Virus NOT DETECTED NOT DETECTED Final   Bordetella pertussis NOT DETECTED NOT DETECTED Final   Chlamydophila pneumoniae NOT DETECTED NOT DETECTED Final   Mycoplasma pneumoniae NOT DETECTED NOT DETECTED Final      Studies: No results found.  Scheduled Meds: . amLODipine  10 mg Oral Daily  . aspirin EC  81 mg Oral Daily  . azaTHIOprine  25 mg Oral Daily  . budesonide (PULMICORT) nebulizer solution  0.25 mg Nebulization BID  . enoxaparin (LOVENOX) injection  40 mg Subcutaneous Q24H  . fenofibrate  160 mg Oral Daily  . furosemide  80 mg Oral Daily  . guaiFENesin  600 mg Oral BID  . insulin aspart  0-20 Units Subcutaneous TID WC  . insulin aspart  0-5 Units Subcutaneous QHS  . insulin aspart  35 Units Subcutaneous TID WC  . insulin glargine  15 Units  Subcutaneous BID  . levalbuterol  0.63 mg Nebulization TID  . methylPREDNISolone (SOLU-MEDROL) injection  40 mg Intravenous Q12H  . metoCLOPramide  5 mg Oral TID AC  . metoprolol tartrate  25 mg Oral BID  . pantoprazole  40 mg Oral BID AC  . sodium chloride flush  10-40 mL Intracatheter Q12H  . sodium chloride flush  3 mL Intravenous Q12H  . tiotropium  18 mcg Inhalation Daily    Continuous Infusions:    LOS: 3 days     Briant Cedar, MD [facility]   If 7PM-7AM, please contact night-coverage [email or web address] Password [facility] 10/03/2017, 10:21 AM

## 2017-10-03 NOTE — Progress Notes (Signed)
OT Cancellation Note  Patient Details Name: Elizabeth Montes MRN: [id number] DOB: [birth year]   Cancelled Treatment:    Reason Eval/Treat Not Completed: Patient declined, no reason specified (2nd attempt at OT evaluation. Pt stating "It's too early in the morning for me to get up and do anything like that. I do just fine." Attempted to discuss medication/inhaler management with pt, but pt continued to stare at her cell phone and ignore therapist. Pt could benefit from OT services, but continues to refuse. Will re-attempt later today if time permits.  Nils Pyle, MS, OTR/L 10/03/2017, 8:41 AM

## 2017-10-04 LAB — CBC WITH DIFFERENTIAL/PLATELET
BASOS PCT: 0 %
Basophils Absolute: 0 10*3/uL (ref 0.0–0.1)
Eosinophils Absolute: 0 10*3/uL (ref 0.0–0.7)
Eosinophils Relative: 0 %
HEMATOCRIT: 38.7 % (ref 36.0–46.0)
Hemoglobin: 12.2 g/dL (ref 12.0–15.0)
LYMPHS ABS: 1 10*3/uL (ref 0.7–4.0)
Lymphocytes Relative: 7 %
MCH: 28.5 pg (ref 26.0–34.0)
MCHC: 31.5 g/dL (ref 30.0–36.0)
MCV: 90.4 fL (ref 78.0–100.0)
MONO ABS: 1.4 10*3/uL — AB (ref 0.1–1.0)
MONOS PCT: 9 %
NEUTROS ABS: 13.6 10*3/uL — AB (ref 1.7–7.7)
Neutrophils Relative %: 84 %
Platelets: 441 10*3/uL — ABNORMAL HIGH (ref 150–400)
RBC: 4.28 MIL/uL (ref 3.87–5.11)
RDW: 15.8 % — AB (ref 11.5–15.5)
WBC: 16 10*3/uL — ABNORMAL HIGH (ref 4.0–10.5)

## 2017-10-04 LAB — BASIC METABOLIC PANEL
Anion gap: 10 (ref 5–15)
BUN: 25 mg/dL — ABNORMAL HIGH (ref 6–20)
CALCIUM: 7.5 mg/dL — AB (ref 8.9–10.3)
CHLORIDE: 103 mmol/L (ref 101–111)
CO2: 23 mmol/L (ref 22–32)
CREATININE: 0.84 mg/dL (ref 0.44–1.00)
GFR calc Af Amer: >60 mL/min (ref >60)
GLUCOSE: 325 mg/dL — AB (ref 65–99)
Potassium: 3.9 mmol/L (ref 3.5–5.1)
Sodium: 136 mmol/L (ref 135–145)

## 2017-10-04 LAB — GLUCOSE, CAPILLARY
GLUCOSE-CAPILLARY: 261 mg/dL — AB (ref 65–99)
GLUCOSE-CAPILLARY: 330 mg/dL — AB (ref 65–99)
GLUCOSE-CAPILLARY: 176 mg/dL — AB (ref 65–99)
Glucose-Capillary: 244 mg/dL — ABNORMAL HIGH (ref 65–99)

## 2017-10-04 MED ORDER — BLOOD GLUCOSE METER KIT: PACK | 0 refills | Status: DC

## 2017-10-04 MED ORDER — BUDESONIDE 0.25 MG/2ML IN SUSP: 0.2500 mg | Freq: Two times a day (BID) | RESPIRATORY_TRACT | 12 refills | Status: DC

## 2017-10-04 MED ORDER — PREDNISONE 10 MG PO TABS: ORAL_TABLET | ORAL | 0 refills | Status: DC

## 2017-10-04 MED ORDER — PANTOPRAZOLE SODIUM 40 MG PO TBEC: 40.0000 mg | DELAYED_RELEASE_TABLET | Freq: Two times a day (BID) | ORAL | 0 refills | Status: AC

## 2017-10-04 MED ORDER — DIPHENHYDRAMINE HCL 25 MG PO CAPS
25.0000 mg | ORAL_CAPSULE | Freq: Once | ORAL | Status: AC
Start: 2017-10-04 — End: 2017-10-04
  Administered 2017-10-04: 25 mg via ORAL
  Filled 2017-10-04: qty 1

## 2017-10-04 MED ORDER — GUAIFENESIN ER 600 MG PO TB12: 600.0000 mg | ORAL_TABLET | Freq: Two times a day (BID) | ORAL | 0 refills | Status: AC

## 2017-10-04 NOTE — Progress Notes (Signed)
Messaged attending MD re:"Pt refusing lovenox, however states will take heparin instead. Do we want to switch?"

## 2017-10-04 NOTE — Discharge Summary (Addendum)
Discharge Summary  Elizabeth Montes WKM:[id number] DOB: 08/24/1955  PCP: Patient, No Pcp Per  Admit date: 09/30/2017 Discharge date: 10/05/2017  Time spent: > 30 mins  Recommendations for Outpatient Follow-up:  1. PCP 2. Pulmonologist  Discharge Diagnoses:  Active Hospital Problems   Diagnosis Date Noted  . Acute exacerbation of COPD with asthma ([place]) 09/30/2017  . Hypoxemia   . Acute pulmonary edema (HCC)   . Pulmonary hypertension ([place]) 09/30/2017  . CKD stage 2 due to type 2 diabetes mellitus ([place]) 09/30/2017  . Type 2 diabetes mellitus, uncontrolled ([place]) 07/16/2017  . Acute on chronic respiratory failure with hypoxia ([place]) 07/16/2017  . Polymyositis ([place]) 07/16/2017  . Hypertension 07/16/2017    Resolved Hospital Problems  No resolved problems to display.    Discharge Condition: Stable  Diet recommendation: Heart healthy, mod carb  Vitals:   10/04/17 1203 10/04/17 1511  BP: (!) 152/76   Pulse: 61   Resp: 20   Temp: 97.6 F (36.4 C)   SpO2: 99% 98%    History of present illness:  Elizabeth Montes a 62 y.o.femalewith medical history significant ofCOPD on home oxygen 6 L, steroid dependent, diabetes type 2, CAD s/p stents in 2017, hypertension, polymyositis, and pulmonary fibrosis, pulm HTN, who presents with worsening dyspnea for the past 2 days. In the ED, pt initially required BiPAP, but was weaned off to [place] O2. Pt was given IV Lasix, Solu-Medrol, and DuoNeb, with significant improvement. CXR showed bibasilar atelectasis and possible pneumonia. CT chest showed no acute thoracic process, but chronic changes consistent with pulmonary fibrosis. Of note, pt has had 9 admissions to our facility in the past 6 months. Pt recently moved here from [place], [place]. Pulmonology consulted. Pt admitted for further management.   Today, pt reported feeling much better, back to her baseline. Denies worsening SOB, chest pain, fever/chills, N/V, abdominal pain. Provided pt with a  nebulizer machine and diabetes kit supply. Pt stable for discharge 10/05/17    Hospital Course:  Principal Problem:   Acute exacerbation of COPD with asthma ([place]) Active Problems:   Type 2 diabetes mellitus, uncontrolled ([place])   Acute on chronic respiratory failure with hypoxia (HCC)   Hypertension   Polymyositis (HCC)   Pulmonary hypertension (HCC)   CKD stage 2 due to type 2 diabetes mellitus (HCC)   Hypoxemia   Acute pulmonary edema (HCC)  Acute on chronic hypoxic respiratory failure Improving on [place] O2, sat well Likely due to COPD exacerbation Vs progressive pulmonary fibrosis + pulm HTN Afebrile, with leukocytosis (likely due to chronic steroid use) Resp viral panel neg LA down trended to WNL Procalcitonin <0.10, unlikely PNA CT chest showed no acute thoracic process, but chronic changes consistent with pulmonary fibrosis Started on IV AB in ED, no BC drawn, no need for IV AB Taper steroids, continue nebs, inhalers  ILD- Pulmonary fibrosis Continue [place] O2 CT Chest as above Pulmonology on board, outpt appt set up with Dr Chase Caller on April 5th  Pulm HTN Likely due to pulm fibrosis ECHO 12/18 showed: LVEF of 55% to 60%, no regional wall motion abnormalities. Left ventricular diastolic function parameters were normal. PA pressure 55 mmHg Follow up with pulmonologist as mentioned above  Acute exacerbation of COPD Management as above  Type 2 DM with hyperglycemia A1c on 09/13/17 showed 8.5 Uncontrolled due to chronic steroid use Continue home lantus, novolog and Victoza Provided pt with script for diabetes kit  HTN Stable Continue amlodipine, metoprolol  Polymyositis Continue azathioprine, stable  Patient reports taking IVIG X 3 doses every 6 weeks given by neurologist in [place] PCP to set up neurology or rheumatology appointment    Procedures:  None  Consultations:  Pulmonology  Discharge Exam: BP (!) 152/76   Pulse 61   Temp 97.6 F (36.4 C)  (Oral)   Resp 20   Ht 5' 3"  (1.6 m)   Wt 85.5 kg (188 lb 7.9 oz)   SpO2 98%   BMI 33.39 kg/m   General: NAD  Cardiovascular: S1, S2 present Respiratory: Diminished BS bilaterally   Discharge Instructions You were cared for by a hospitalist during your hospital stay. If you have any questions about your discharge medications or the care you received while you were in the hospital after you are discharged, you can call the unit and asked to speak with the hospitalist on call if the hospitalist that took care of you is not available. Once you are discharged, your primary care physician will handle any further medical issues. Please note that NO REFILLS for any discharge medications will be authorized once you are discharged, as it is imperative that you return to your primary care physician (or establish a relationship with a primary care physician if you do not have one) for your aftercare needs so that they can reassess your need for medications and monitor your lab values.   Allergies as of 10/04/2017      Reactions   Lovenox [enoxaparin Sodium] Other (See Comments)   "made me bleed"      Medication List    TAKE these medications   amLODipine 10 MG tablet Commonly known as:  NORVASC Take 1 tablet (10 mg total) by mouth daily.   antiseptic oral rinse Liqd Take 15 mLs by mouth as needed for dry mouth.   aspirin 81 MG EC tablet Take 1 tablet (81 mg total) by mouth daily.   azaTHIOprine 50 MG tablet Commonly known as:  IMURAN Take 0.5 tablets (25 mg total) by mouth daily.   bisacodyl 5 MG EC tablet Commonly known as:  DULCOLAX Take 1 tablet (5 mg total) by mouth daily as needed for mild constipation.   blood glucose meter kit and supplies Dispense based on patient and insurance preference. Use up to four times daily as directed. (FOR ICD-10 E10.9, E11.9).   budesonide 0.25 MG/2ML nebulizer solution Commonly known as:  PULMICORT Take 2 mLs (0.25 mg total) by nebulization 2  (two) times daily.   fenofibrate 160 MG tablet Take 1 tablet (160 mg total) by mouth daily.   furosemide 80 MG tablet Commonly known as:  LASIX Take 1 tablet (80 mg total) by mouth daily.   guaiFENesin 600 MG 12 hr tablet Commonly known as:  MUCINEX Take 1 tablet (600 mg total) by mouth 2 (two) times daily for 10 days.   liraglutide 18 MG/3ML Sopn Commonly known as:  VICTOZA Inject 1.2 mg into the skin daily.   metoCLOPramide 10 MG tablet Commonly known as:  REGLAN Take 5 mg by mouth 3 (three) times daily before meals.   metoprolol tartrate 25 MG tablet Commonly known as:  LOPRESSOR Take 1 tablet (25 mg total) by mouth 2 (two) times daily.   NOVOLOG FLEXPEN 100 UNIT/ML FlexPen Generic drug:  insulin aspart Inject 25 Units into the skin 3 (three) times daily with meals.   oxyCODONE 5 MG immediate release tablet Commonly known as:  Oxy IR/ROXICODONE Take 5 mg by mouth every 6 (six) hours as needed (for pain).  pantoprazole 40 MG tablet Commonly known as:  PROTONIX Take 1 tablet (40 mg total) by mouth 2 (two) times daily before a meal.   predniSONE 10 MG tablet Commonly known as:  DELTASONE Take 4 tablets for 2 days, then 3 tablets for 2 days, then continue with 2 tablets   SPIRIVA HANDIHALER 18 MCG inhalation capsule Generic drug:  tiotropium Place 18 mcg into inhaler and inhale daily as needed (for wheezing).            Durable Medical Equipment  (From admission, onward)        Start     Ordered   10/04/17 1136  For home use only DME Nebulizer machine  Once    Question:  Patient needs a nebulizer to treat with the following condition  Answer:  COPD (chronic obstructive pulmonary disease) ([place])   10/04/17 1136     Allergies  Allergen Reactions  . Lovenox [Enoxaparin Sodium] Other (See Comments)    "made me bleed"   Follow-up Information    Brand Males, MD Follow up on 10/29/2017.   Specialty:  Pulmonary Disease Why:  [place] with MD. Contact  information: [place] [address] [phone]        [place]. Call.   Why:  Make appointment to establish PCP Contact information: [place]. Call.   Why:  Make appointment to establish PCP Contact information: [place] [address] [place] [address]  Phone: [phone]       [facility], [facility] Follow up.   Specialty:  [place] Why:  will call to set up visits Contact information: [address] [place] [address] [phone]        [facility] - Dme Follow up.   Why:  Arranged for nebulizer to be delivered prior to discharge.  Contact information: [address] [place] [address] [phone]            The results of significant diagnostics from this hospitalization (including imaging, microbiology, ancillary and laboratory) are listed below for reference.    Significant Diagnostic Studies: Ct Chest Wo Contrast  Result Date: 09/30/2017 CLINICAL DATA:  Increased shortness of breath. EXAM: CT CHEST WITHOUT CONTRAST TECHNIQUE: Multidetector CT imaging of the chest was performed following the standard protocol without IV contrast. COMPARISON:  CT chest dated August 01, 2017. FINDINGS: Cardiovascular: No significant vascular findings. Normal heart size. No pericardial effusion. Normal caliber thoracic aorta. Coronary, aortic arch, and branch vessel atherosclerotic vascular disease. Mild enlargement of the main pulmonary artery, measuring up to 3.1 cm. Left-sided PICC line with the tip in the proximal SVC. Mediastinum/Nodes: Stable mildly enlarged mediastinal and bilateral hilar lymph nodes, measuring up to 1.5 cm. No axillary lymphadenopathy. The thyroid and esophagus are unremarkable. Lungs/Pleura: Persistent basilar predominant inter and  interlobular septal thickening with scattered subpleural ground-glass density, overall similar to prior study. Bibasilar atelectasis. Mild to moderate centrilobular emphysema. No focal consolidation, pleural effusion, or pneumothorax. No suspicious pulmonary nodule. Upper Abdomen: No acute abnormality. Musculoskeletal: No acute or suspicious osseous findings. IMPRESSION: 1. No definite acute intrathoracic process. Chronic changes consistent with interstitial lung disease are overall similar to prior study. 2. Stable mildly enlarged mediastinal and bilateral hilar lymph nodes, favored reactive. 3.  Emphysema (ICD10-J43.9). 4.  Aortic atherosclerosis (ICD10-I70.0). Electronically Signed  By: Titus Dubin M.D.   On: 09/30/2017 08:50   Dg Chest Port 1 View  Result Date: 09/30/2017 CLINICAL DATA:  Respiratory distress. History of acute and chronic CHF, COPD, acute and chronic renal failure. EXAM: PORTABLE CHEST 1 VIEW COMPARISON:  Portable chest x-ray of September 13, 2017 FINDINGS: The lungs are adequately inflated. The interstitial markings are coarse. There are patchy interstitial densities at both lung bases greatest on the left. There is no pleural effusion. The heart is mildly enlarged. The pulmonary vascularity is mildly prominent centrally. There is calcification in the wall of the aortic arch. The PICC line tip projects over the midportion of the SVC. IMPRESSION: COPD. Bibasilar atelectasis or pneumonia. There may be low-grade CHF. Followup PA and lateral chest X-ray is recommended in 3-4 weeks following trial of antibiotic therapy to ensure resolution and exclude underlying malignancy. Thoracic aortic atherosclerosis. Electronically Signed   By: David  Martinique M.D.   On: 09/30/2017 07:10   Dg Chest Port 1 View  Result Date: 09/13/2017 CLINICAL DATA:  CHF.  Shortness of breath.  Chest pain.  Cough. EXAM: PORTABLE CHEST 1 VIEW COMPARISON:  Frontal and lateral views 08/11/2017 FINDINGS: Tip of the left  upper extremity PICC in the proximal SVC. Heart is upper normal in size. Mediastinal contours are unchanged. Increased interstitial opacities above baseline suspicious for pulmonary edema. Streaky bibasilar opacities are likely atelectasis. No large pleural effusion. No pneumothorax. IMPRESSION: 1. Mild pulmonary edema. 2. Increased bibasilar atelectasis. 3. Tip of the left upper extremity PICC in the proximal SVC. Electronically Signed   By: Jeb Levering M.D.   On: 09/13/2017 02:46    Microbiology: Recent Results (from the past 240 hour(s))  Respiratory Panel by PCR     Status: None   Collection Time: 09/30/17  8:52 AM  Result Value Ref Range Status   Adenovirus NOT DETECTED NOT DETECTED Final   Coronavirus 229E NOT DETECTED NOT DETECTED Final   Coronavirus HKU1 NOT DETECTED NOT DETECTED Final   Coronavirus NL63 NOT DETECTED NOT DETECTED Final   Coronavirus OC43 NOT DETECTED NOT DETECTED Final   Metapneumovirus NOT DETECTED NOT DETECTED Final   Rhinovirus / Enterovirus NOT DETECTED NOT DETECTED Final   Influenza A NOT DETECTED NOT DETECTED Final   Influenza B NOT DETECTED NOT DETECTED Final   Parainfluenza Virus 1 NOT DETECTED NOT DETECTED Final   Parainfluenza Virus 2 NOT DETECTED NOT DETECTED Final   Parainfluenza Virus 3 NOT DETECTED NOT DETECTED Final   Parainfluenza Virus 4 NOT DETECTED NOT DETECTED Final   Respiratory Syncytial Virus NOT DETECTED NOT DETECTED Final   Bordetella pertussis NOT DETECTED NOT DETECTED Final   Chlamydophila pneumoniae NOT DETECTED NOT DETECTED Final   Mycoplasma pneumoniae NOT DETECTED NOT DETECTED Final     Labs: Basic Metabolic Panel: Recent Labs  Lab 09/30/17 0623 10/01/17 0247 10/02/17 0559 10/03/17 0844 10/04/17 0500  NA 137 135 136 135 136  K 2.8* 4.6 4.2 3.8 3.9  CL 103 105 105 101 103  CO2 18* 19* 21* 22 23  GLUCOSE 213* 351* 402* 358* 325*  BUN 11 15 21* 23* 25*  CREATININE 1.02* 1.02* 0.94 0.96 0.84  CALCIUM 9.0 8.2* 7.7*  8.0* 7.5*   Liver Function Tests: Recent Labs  Lab 09/30/17 0623  AST 45*  ALT 36  ALKPHOS 75  BILITOT 0.7  PROT 6.8  ALBUMIN 3.1*   No results for input(s): LIPASE, AMYLASE in the last 168 hours. No results for input(s): AMMONIA in  the last 168 hours. CBC: Recent Labs  Lab 09/30/17 0623 10/01/17 0247 10/02/17 0559 10/03/17 0844 10/04/17 0500  WBC 17.8* 14.4* 15.4* 15.1* 16.0*  NEUTROABS 14.5*  --  14.3* 13.9* 13.6*  HGB 13.5 12.1 11.0* 11.6* 12.2  HCT 42.4 38.0 35.7* 36.9 38.7  MCV 90.8 90.0 91.1 90.9 90.4  PLT 433* 391 404* 409* 441*   Cardiac Enzymes: No results for input(s): CKTOTAL, CKMB, CKMBINDEX, TROPONINI in the last 168 hours. BNP: BNP (last 3 results) Recent Labs    08/11/17 0103 09/13/17 0345 09/30/17 0623  BNP 82.5 41.3 79.2    ProBNP (last 3 results) No results for input(s): PROBNP in the last 8760 hours.  CBG: Recent Labs  Lab 10/03/17 1600 10/03/17 2123 10/04/17 0713 10/04/17 1130 10/04/17 1617  GLUCAP 309* 205* 261* 330* 176*       Signed:  Alma Friendly, MD [facility] 10/04/2017, 4:42 PM

## 2017-10-04 NOTE — Evaluation (Signed)
Occupational Therapy Evaluation Patient Details Name: Elizabeth Montes MRN: [id number] DOB: 1955-09-04 Today's Date: 10/04/2017    History of Present Illness Pt is a 62 y/o female female with medical history significant of COPD on home oxygen 6 L nasal cannula, diabetes type 2, coronary artery disease with an acute myocardial infarction with stents in 2017, hypertension, polymyositis, and pulmonary fibrosis who presents in respiratory distress.  Per EMS her sats were 80% on room air at home.  She has a history of 9 admissions to our facility in 6 months. Pt admitted with acute COPD exacerbation.   Clinical Impression   Pt is at baseline Mod I level of function with ADLs and ADL mobility. Reviewed energy conservation strategies with pt. All education completed and no further acute OT indicated at this time    Follow Up Recommendations  No OT follow up;Supervision - Intermittent    Equipment Recommendations  None recommended by OT    Recommendations for Other Services       Precautions / Restrictions Precautions Precautions: Other (comment)(O2 SATs) Precaution Comments: monitor SPO2 Restrictions Weight Bearing Restrictions: No      Mobility Bed Mobility Overal bed mobility: Modified Independent                Transfers Overall transfer level: Modified independent Equipment used: None                  Balance Overall balance assessment: Needs assistance Sitting-balance support: Feet supported;No upper extremity supported Sitting balance-Leahy Scale: Good     Standing balance support: During functional activity;No upper extremity supported Standing balance-Leahy Scale: Good                             ADL either performed or assessed with clinical judgement   ADL Overall ADL's : Modified independent;At baseline                                       General ADL Comments: increased time and multiple rest breaks to complete.  Educated/reviewed energy conservation techniques for ADLs and ADL mobility, pt verblaizes and demos understanding     Vision Baseline Vision/History: Wears glasses Wears Glasses: Reading only Patient Visual Report: No change from baseline       Perception     Praxis      Pertinent Vitals/Pain Pain Assessment: No/denies pain     Hand Dominance Right   Extremity/Trunk Assessment Upper Extremity Assessment Upper Extremity Assessment: Overall WFL for tasks assessed   Lower Extremity Assessment Lower Extremity Assessment: Defer to PT evaluation   Cervical / Trunk Assessment Cervical / Trunk Assessment: Normal   Communication Communication Communication: No difficulties   Cognition Arousal/Alertness: Awake/alert Behavior During Therapy: WFL for tasks assessed/performed;Flat affect Overall Cognitive Status: Within Functional Limits for tasks assessed                                     General Comments       Exercises     Shoulder Instructions      Home Living Family/patient expects to be discharged to:: Private residence Living Arrangements: Children Available Help at Discharge: Family;Available PRN/intermittently Type of Home: Apartment Home Access: Level entry     Home Layout: Two level;Able to live on main  level with bedroom/bathroom;Bed/bath upstairs Alternate Level Stairs-Number of Steps: 13 Alternate Level Stairs-Rails: Right;Left Bathroom Shower/Tub: Chief Strategy Officer: Standard     Home Equipment: Emergency planning/management officer - 2 wheels;Cane - single point;Wheelchair - [employer];Hospital bed;Adaptive equipment Adaptive Equipment: [place]acher Additional Comments: pt on 3L of O2 at all times      Prior Functioning/Environment Level of Independence: Independent                 OT Problem List: Decreased activity tolerance;Cardiopulmonary status limiting activity      OT Treatment/Interventions:      OT  Goals(Current goals can be found in the care plan section) Acute Rehab OT Goals Patient Stated Goal: return home OT Goal Formulation: With patient  OT Frequency:     Barriers to D/C:    no barriers       Co-evaluation              AM-PAC PT "6 Clicks" Daily Activity     Outcome Measure Help from another person eating meals?: None Help from another person taking care of personal grooming?: None Help from another person toileting, which includes using toliet, bedpan, or urinal?: None Help from another person bathing (including washing, rinsing, drying)?: None Help from another person to put on and taking off regular upper body clothing?: None Help from another person to put on and taking off regular lower body clothing?: None 6 Click Score: 24   End of Session Equipment Utilized During Treatment: Oxygen  Activity Tolerance: Patient tolerated treatment well;Patient limited by fatigue Patient left: in bed;with call bell/phone within reach  OT Visit Diagnosis: Other abnormalities of gait and mobility (R26.89)                Time: 2197-5883 OT Time Calculation (min): 25 min Charges:  OT General Charges $OT Visit: 1 Visit OT Evaluation $OT Eval Low Complexity: 1 Low OT Treatments $Therapeutic Activity: 8-22 mins G-Codes: OT G-codes **NOT FOR INPATIENT CLASS** Functional Assessment Tool Used: AM-PAC 6 Clicks Daily Activity     Galen Manila 10/04/2017, 12:42 PM

## 2017-10-04 NOTE — Progress Notes (Signed)
Name: KEONA BILYEU MRN: [id number] DOB: 1955-12-17    ADMISSION DATE:  09/30/2017 CONSULTATION DATE: 09/30/2017  REFERRING MD : [facility]  CHIEF COMPLAINT: Dyspnea  BRIEF PATIENT DESCRIPTION: Morbidly obese female who is on 4 L nasal cannula no acute distress  SIGNIFICANT EVENTS  Admitted to [facility] 09/30/2017 for acute exacerbation of COPD Discharge from [facility] 09/17/2017 for acute exacerbation of COPD  STUDIES:  No results found.    HISTORY OF PRESENT ILLNESS:   62 year old who has multiple admissions for acute exacerbation of COPD.  She was recently discharged from [facility] on September 17, 2017 for acute exacerbation of COPD.  She had 2D echo at the time that showed relatively normal LV functions.  At the time of examination she is unable to tell me if she smokes or does not smoke.  EMS was activated today 09/30/2017 due to her reported hypoxia with O2 sats 80% on room air.  She was treated with BiPAP oxygen steroids although she has no wheezing at the time of examination.  She carries past medical history of diabetes mellitus type 2 coronary artery disease with a reported acute infarction with stent in 2017 she also is notable for polymyositis, pulmonary fibrosis.  Chart review I see no evidence that she is been evaluated by pulmonary since her arrival and read from [place].  She does not appear to have been an infectious state.  She is been treated with steroids oxygen and BiPAP and is non-bronchospastic at the time of my examination.  Her sats are adequate on 4 L nasal cannula.  She is being treated with antimicrobial therapy but CT scan does not support an infectious process.  Note her white count is elevated 17.8 but reportedly she has been on steroids almost consistently over last several months.  Further orders have been reviewed changes have been made pulmonary critical care will be available as needed.  .  SUBJECTIVE:  No events overnight,  tolerating 3L [place] very well.  VITAL SIGNS: Temp:  [97.5 F (36.4 C)-98 F (36.7 C)] 98 F (36.7 C) (03/11 0610) Pulse Rate:  [59-84] 59 (03/11 0610) Resp:  [20] 20 (03/10 1119) BP: (100-149)/(56-70) 100/64 (03/11 0810) SpO2:  [96 %-98 %] 97 % (03/11 0810)  PHYSICAL EXAMINATION: General: Overweight female, resting comfortably in exam bed, NAD HEENT: [place]/AT, PERRL, EOM-I and MMM PSY: Normal Neuro: Alert and interactive, moving all ext to command CV: RRR, Nl S1/S2 and -M/R/G. PULM: Decrease BS at the bases GI: Soft, NT, ND and +BS Extremities: warm/dry, 1+ edema  Skin: no rashes or lesions  Recent Labs  Lab 10/02/17 0559 10/03/17 0844 10/04/17 0500  NA 136 135 136  K 4.2 3.8 3.9  CL 105 101 103  CO2 21* 22 23  BUN 21* 23* 25*  CREATININE 0.94 0.96 0.84  GLUCOSE 402* 358* 325*   Recent Labs  Lab 10/02/17 0559 10/03/17 0844 10/04/17 0500  HGB 11.0* 11.6* 12.2  HCT 35.7* 36.9 38.7  WBC 15.4* 15.1* 16.0*  PLT 404* 409* 441*   I reviewed CXR myself, pulmonary infiltrate noted  Discussion: 62 year old who has multiple admissions for acute exacerbation of COPD.  She was recently discharged from [facility] on September 17, 2017 for acute exacerbation of COPD.  She had 2D echo at the time that showed relatively normal LV functions.  At the time of examination she is unable to tell me if she smokes or does not smoke.  EMS was activated  today 09/30/2017 due to her reported hypoxia with O2 sats 80% on room air.  She was treated with BiPAP oxygen steroids although she has no wheezing at the time of examination.  She carries past medical history of diabetes mellitus type 2 coronary artery disease with a reported acute infarction with stent in 2017 she also is notable for polymyositis, pulmonary fibrosis.  Chart review I see no evidence that she is been evaluated by pulmonary since her arrival and read from [place].  She does not appear to have been an infectious  state.  She is been treated with steroids oxygen and BiPAP and is non-bronchospastic at the time of my examination.  Her sats are adequate on 3 L nasal cannula.  She is being treated with antimicrobial therapy but CT scan does not support an infectious process.  Note her white count is elevated 17.8 but reportedly she has been on steroids almost consistently over last several months.  ASSESSMENT:   Acute exacerbation of COPD with asthma (HCC)   Type 2 diabetes mellitus, uncontrolled (HCC)   Acute on chronic respiratory failure with hypoxia (HCC)   Hypertension   Polymyositis (HCC)   Pulmonary hypertension (HCC)   CKD stage 2 due to type 2 diabetes mellitus (HCC)    Frequent admissions    Generalized failure to thrive  PLAN:  Needs to be followed by pulmonary as an outpatient.  He has an appointment made to see Dr. Marchelle Gearing on April 5 at 9:15 AM to establish with a pulmonary practice and [facility].  62 year old female with COPD and frequent admission presenting now with another exacerbation of COPD.  On exam, distant BS with minimal wheezing.  I reviewed CXR myself, hyperinflation noted.  Discussed with PCCM-NP.  COPD exacerbation:  - Bronchodilators as ordered  - Steroid taper, change to prednisone 40 mg PO daily and taper over a week period.  - PPI BID  Hypoxemia:  - Titrate O2 for sat of 88-92%  Acute pulmonary edema:  - Lasix as ordered - 80 mg PO daily  Appointment made as above.  Appointment made for f/u as above.  Taper steroids.  PCCM will sign off, please call back if needed.  Patient seen and examined, agree with above note.  I dictated the care and orders written for this patient under my direction.  Alyson Reedy, MD [phone]

## 2017-10-04 NOTE — Progress Notes (Addendum)
Call placed to [facility] at [phone]: Meghan notified of patient discharge orders today.  Patient with discharge orders home today. In to speak with patient, Offered choice for DME, patient selected [facility].  Referral for DME called to [facility] with [facility] for nebulizer. Arranged for DME to be delivered prior to discharge.  Yancey Flemings, BSN, RN Nurse case [employer]

## 2017-10-05 LAB — CBC WITH DIFFERENTIAL/PLATELET
BASOS ABS: 0 10*3/uL (ref 0.0–0.1)
Basophils Relative: 0 %
Eosinophils Absolute: 0 10*3/uL (ref 0.0–0.7)
Eosinophils Relative: 0 %
HEMATOCRIT: 37.3 % (ref 36.0–46.0)
HEMOGLOBIN: 11.7 g/dL — AB (ref 12.0–15.0)
LYMPHS PCT: 10 %
Lymphs Abs: 1.8 10*3/uL (ref 0.7–4.0)
MCH: 28.1 pg (ref 26.0–34.0)
MCHC: 31.4 g/dL (ref 30.0–36.0)
MCV: 89.4 fL (ref 78.0–100.0)
MONOS PCT: 7 %
Monocytes Absolute: 1.2 10*3/uL — ABNORMAL HIGH (ref 0.1–1.0)
NEUTROS ABS: 14.8 10*3/uL — AB (ref 1.7–7.7)
NEUTROS PCT: 83 %
Platelets: 434 10*3/uL — ABNORMAL HIGH (ref 150–400)
RBC: 4.17 MIL/uL (ref 3.87–5.11)
RDW: 15.6 % — ABNORMAL HIGH (ref 11.5–15.5)
WBC: 17.8 10*3/uL — AB (ref 4.0–10.5)

## 2017-10-05 LAB — BASIC METABOLIC PANEL
Anion gap: 8 (ref 5–15)
BUN: 26 mg/dL — ABNORMAL HIGH (ref 6–20)
CALCIUM: 8 mg/dL — AB (ref 8.9–10.3)
CHLORIDE: 100 mmol/L — AB (ref 101–111)
CO2: 27 mmol/L (ref 22–32)
CREATININE: 0.93 mg/dL (ref 0.44–1.00)
GFR calc non Af Amer: >60 mL/min (ref >60)
Glucose, Bld: 269 mg/dL — ABNORMAL HIGH (ref 65–99)
Potassium: 4.1 mmol/L (ref 3.5–5.1)
SODIUM: 135 mmol/L (ref 135–145)

## 2017-10-05 LAB — GLUCOSE, CAPILLARY
GLUCOSE-CAPILLARY: 266 mg/dL — AB (ref 65–99)
Glucose-Capillary: 247 mg/dL — ABNORMAL HIGH (ref 65–99)

## 2017-10-05 MED ORDER — HEPARIN SOD (PORK) LOCK FLUSH 100 UNIT/ML IV SOLN
500.0000 [IU] | INTRAVENOUS | Status: AC | PRN
  Administered 2017-10-05: 500 [IU]

## 2017-10-05 NOTE — Progress Notes (Signed)
pts port deaccessed. Dtr here to pick pt up. Pt taken downstairs by wheelchair w/ oxygen. Pts dtr w/ portable O2 in car.

## 2017-10-05 NOTE — Progress Notes (Addendum)
Discharge paperwork, follow up instructions, and medications reviewed w/ pt. Applicable scripts provided. Pt states no questions or concerns. Pt to d/c to home w/ dtr.   IV therapy has been paged for port-a-cath deaccess.

## 2017-10-05 NOTE — Progress Notes (Signed)
Inpatient Diabetes Program Recommendations  AACE/ADA: New Consensus Statement on Inpatient Glycemic Control (2015)  Target Ranges:  Prepandial:   less than 140 mg/dL      Peak postprandial:   less than 180 mg/dL (1-2 hours)      Critically ill patients:  140 - 180 mg/dL   Lab Results  Component Value Date   GLUCAP 247 (H) 10/05/2017   HGBA1C 8.5 (H) 09/13/2017    Review of Glycemic ControlResults for Elizabeth, Montes (MRN [id number]) as of 10/05/2017 11:03  Ref. Range 10/04/2017 07:13 10/04/2017 11:30 10/04/2017 16:17 10/04/2017 20:56 10/05/2017 07:37  Glucose-Capillary Latest Ref Range: 65 - 99 mg/dL 638 (H) 453 (H) 646 (H) 244 (H) 247 (H)    Diabetes history: Type 2 DM Outpatient Diabetes medications: Novolog 25 units tid with meals, Victoza 1.2 mg daily,  Current orders for Inpatient glycemic control: Novolog resistant tid with meals and HS, Novolog 35 units tid with meals, Lantus 15 units bid Solumedrol 40 mg IV q 12 hours Inpatient Diabetes Program Recommendations:    Please consider increasing Lantus to 20 units bid.   Thanks,  Beryl Meager, RN, BC-ADM Inpatient Diabetes Coordinator Pager [phone] (8a-5p)

## 2017-10-11 ENCOUNTER — Encounter (HOSPITAL_COMMUNITY): Payer: Self-pay | Admitting: Emergency Medicine

## 2017-10-11 DIAGNOSIS — R079 Chest pain, unspecified: Secondary | ICD-10-CM | POA: Diagnosis not present

## 2017-10-11 DIAGNOSIS — Z5321 Procedure and treatment not carried out due to patient leaving prior to being seen by health care provider: Secondary | ICD-10-CM | POA: Diagnosis not present

## 2017-10-11 NOTE — ED Triage Notes (Signed)
Pt transported from home by EMS for c/o chest pain with flu like s/s x 1 week, cough x 2 days.  Per EMS pt has been off all home meds x 1 week d/t recent relocation

## 2017-10-12 ENCOUNTER — Emergency Department (HOSPITAL_COMMUNITY)
Admission: EM | Admit: 2017-10-12 | Discharge: 2017-10-12 | Disposition: A | Discharge status: Home / Self Care | Payer: Medicare HMO | Attending: Emergency Medicine | Admitting: Emergency Medicine

## 2017-10-12 ENCOUNTER — Emergency Department (HOSPITAL_COMMUNITY): Payer: Medicare HMO

## 2017-10-12 LAB — BASIC METABOLIC PANEL
Anion gap: 11 (ref 5–15)
BUN: 10 mg/dL (ref 6–20)
CHLORIDE: 104 mmol/L (ref 101–111)
CO2: 19 mmol/L — AB (ref 22–32)
CREATININE: 1 mg/dL (ref 0.44–1.00)
Calcium: 8.6 mg/dL — ABNORMAL LOW (ref 8.9–10.3)
GFR calc Af Amer: >60 mL/min (ref >60)
GFR calc non Af Amer: >60 mL/min (ref >60)
GLUCOSE: 163 mg/dL — AB (ref 65–99)
Potassium: 3.2 mmol/L — ABNORMAL LOW (ref 3.5–5.1)
SODIUM: 134 mmol/L — AB (ref 135–145)

## 2017-10-12 LAB — CBC
HEMATOCRIT: 38.4 % (ref 36.0–46.0)
Hemoglobin: 12.3 g/dL (ref 12.0–15.0)
MCH: 28.3 pg (ref 26.0–34.0)
MCHC: 32 g/dL (ref 30.0–36.0)
MCV: 88.3 fL (ref 78.0–100.0)
PLATELETS: 411 10*3/uL — AB (ref 150–400)
RBC: 4.35 MIL/uL (ref 3.87–5.11)
RDW: 16.4 % — AB (ref 11.5–15.5)
WBC: 17.6 10*3/uL — AB (ref 4.0–10.5)

## 2017-10-12 LAB — I-STAT TROPONIN, ED: Troponin i, poc: 0.04 ng/mL (ref 0.00–0.08)

## 2017-10-12 NOTE — ED Notes (Signed)
No answer for call to room, X [facility] called and patient is not over there.  Oxygen tank turned into nurse 1st secretary earlier  Will discharge patient from system

## 2017-10-19 ENCOUNTER — Encounter (HOSPITAL_COMMUNITY): Payer: Self-pay | Admitting: *Deleted

## 2017-10-19 ENCOUNTER — Inpatient Hospital Stay (HOSPITAL_COMMUNITY)
Admission: EM | Admit: 2017-10-19 | Discharge: 2017-10-23 | DRG: 286 | Disposition: A | Discharge status: Home / Self Care | Payer: Medicare HMO | Attending: Internal Medicine | Admitting: Internal Medicine

## 2017-10-19 ENCOUNTER — Emergency Department (HOSPITAL_COMMUNITY): Payer: Medicare HMO

## 2017-10-19 ENCOUNTER — Inpatient Hospital Stay (HOSPITAL_COMMUNITY): Payer: Medicare HMO

## 2017-10-19 ENCOUNTER — Other Ambulatory Visit: Payer: Self-pay

## 2017-10-19 DIAGNOSIS — Z9114 Patient's other noncompliance with medication regimen: Secondary | ICD-10-CM

## 2017-10-19 DIAGNOSIS — Z955 Presence of coronary angioplasty implant and graft: Secondary | ICD-10-CM

## 2017-10-19 DIAGNOSIS — I361 Nonrheumatic tricuspid (valve) insufficiency: Secondary | ICD-10-CM | POA: Diagnosis not present

## 2017-10-19 DIAGNOSIS — N182 Chronic kidney disease, stage 2 (mild): Secondary | ICD-10-CM | POA: Diagnosis present

## 2017-10-19 DIAGNOSIS — J8410 Pulmonary fibrosis, unspecified: Secondary | ICD-10-CM | POA: Diagnosis not present

## 2017-10-19 DIAGNOSIS — R768 Other specified abnormal immunological findings in serum: Secondary | ICD-10-CM | POA: Diagnosis not present

## 2017-10-19 DIAGNOSIS — Z823 Family history of stroke: Secondary | ICD-10-CM | POA: Diagnosis not present

## 2017-10-19 DIAGNOSIS — I2729 Other secondary pulmonary hypertension: Secondary | ICD-10-CM | POA: Diagnosis present

## 2017-10-19 DIAGNOSIS — E1122 Type 2 diabetes mellitus with diabetic chronic kidney disease: Secondary | ICD-10-CM | POA: Diagnosis present

## 2017-10-19 DIAGNOSIS — I509 Heart failure, unspecified: Secondary | ICD-10-CM

## 2017-10-19 DIAGNOSIS — N179 Acute kidney failure, unspecified: Secondary | ICD-10-CM

## 2017-10-19 DIAGNOSIS — J441 Chronic obstructive pulmonary disease with (acute) exacerbation: Secondary | ICD-10-CM | POA: Diagnosis present

## 2017-10-19 DIAGNOSIS — E1159 Type 2 diabetes mellitus with other circulatory complications: Secondary | ICD-10-CM | POA: Diagnosis present

## 2017-10-19 DIAGNOSIS — Z833 Family history of diabetes mellitus: Secondary | ICD-10-CM | POA: Diagnosis not present

## 2017-10-19 DIAGNOSIS — Z888 Allergy status to other drugs, medicaments and biological substances status: Secondary | ICD-10-CM | POA: Diagnosis not present

## 2017-10-19 DIAGNOSIS — J84112 Idiopathic pulmonary fibrosis: Secondary | ICD-10-CM | POA: Diagnosis present

## 2017-10-19 DIAGNOSIS — M3320 Polymyositis, organ involvement unspecified: Secondary | ICD-10-CM | POA: Diagnosis not present

## 2017-10-19 DIAGNOSIS — I252 Old myocardial infarction: Secondary | ICD-10-CM | POA: Diagnosis not present

## 2017-10-19 DIAGNOSIS — Z794 Long term (current) use of insulin: Secondary | ICD-10-CM

## 2017-10-19 DIAGNOSIS — Z7951 Long term (current) use of inhaled steroids: Secondary | ICD-10-CM

## 2017-10-19 DIAGNOSIS — I2510 Atherosclerotic heart disease of native coronary artery without angina pectoris: Secondary | ICD-10-CM | POA: Diagnosis present

## 2017-10-19 DIAGNOSIS — Z6832 Body mass index (BMI) 32.0-32.9, adult: Secondary | ICD-10-CM

## 2017-10-19 DIAGNOSIS — I2721 Secondary pulmonary arterial hypertension: Secondary | ICD-10-CM | POA: Diagnosis present

## 2017-10-19 DIAGNOSIS — I130 Hypertensive heart and chronic kidney disease with heart failure and stage 1 through stage 4 chronic kidney disease, or unspecified chronic kidney disease: Principal | ICD-10-CM | POA: Diagnosis present

## 2017-10-19 DIAGNOSIS — Z809 Family history of malignant neoplasm, unspecified: Secondary | ICD-10-CM | POA: Diagnosis not present

## 2017-10-19 DIAGNOSIS — J9620 Acute and chronic respiratory failure, unspecified whether with hypoxia or hypercapnia: Secondary | ICD-10-CM | POA: Diagnosis present

## 2017-10-19 DIAGNOSIS — J849 Interstitial pulmonary disease, unspecified: Secondary | ICD-10-CM | POA: Diagnosis not present

## 2017-10-19 DIAGNOSIS — Z9981 Dependence on supplemental oxygen: Secondary | ICD-10-CM

## 2017-10-19 DIAGNOSIS — Z87891 Personal history of nicotine dependence: Secondary | ICD-10-CM

## 2017-10-19 DIAGNOSIS — E669 Obesity, unspecified: Secondary | ICD-10-CM | POA: Diagnosis present

## 2017-10-19 DIAGNOSIS — E785 Hyperlipidemia, unspecified: Secondary | ICD-10-CM | POA: Diagnosis present

## 2017-10-19 DIAGNOSIS — Z7952 Long term (current) use of systemic steroids: Secondary | ICD-10-CM

## 2017-10-19 DIAGNOSIS — J9621 Acute and chronic respiratory failure with hypoxia: Secondary | ICD-10-CM

## 2017-10-19 DIAGNOSIS — E874 Mixed disorder of acid-base balance: Secondary | ICD-10-CM | POA: Diagnosis present

## 2017-10-19 DIAGNOSIS — E876 Hypokalemia: Secondary | ICD-10-CM | POA: Diagnosis present

## 2017-10-19 DIAGNOSIS — Z7982 Long term (current) use of aspirin: Secondary | ICD-10-CM | POA: Diagnosis not present

## 2017-10-19 DIAGNOSIS — E119 Type 2 diabetes mellitus without complications: Secondary | ICD-10-CM | POA: Diagnosis not present

## 2017-10-19 DIAGNOSIS — R0902 Hypoxemia: Secondary | ICD-10-CM

## 2017-10-19 DIAGNOSIS — I5033 Acute on chronic diastolic (congestive) heart failure: Secondary | ICD-10-CM | POA: Diagnosis present

## 2017-10-19 DIAGNOSIS — I10 Essential (primary) hypertension: Secondary | ICD-10-CM | POA: Diagnosis present

## 2017-10-19 HISTORY — DX: Pulmonary fibrosis, unspecified: J84.10

## 2017-10-19 HISTORY — DX: Pulmonary hypertension, unspecified: I27.20

## 2017-10-19 HISTORY — DX: Hyperlipidemia, unspecified: E78.5

## 2017-10-19 HISTORY — DX: Polymyositis, organ involvement unspecified: M33.20

## 2017-10-19 HISTORY — DX: Obesity, unspecified: E66.9

## 2017-10-19 HISTORY — DX: Atherosclerotic heart disease of native coronary artery without angina pectoris: I25.10

## 2017-10-19 LAB — BASIC METABOLIC PANEL
Anion gap: 17 — ABNORMAL HIGH (ref 5–15)
Anion gap: 15 (ref 5–15)
Anion gap: 13 (ref 5–15)
BUN: 11 mg/dL (ref 6–20)
BUN: 10 mg/dL (ref 6–20)
BUN: 13 mg/dL (ref 6–20)
CHLORIDE: 102 mmol/L (ref 101–111)
CHLORIDE: 103 mmol/L (ref 101–111)
CO2: 18 mmol/L — ABNORMAL LOW (ref 22–32)
CO2: 19 mmol/L — ABNORMAL LOW (ref 22–32)
CO2: 18 mmol/L — AB (ref 22–32)
CREATININE: 1.57 mg/dL — AB (ref 0.44–1.00)
CREATININE: 1.5 mg/dL — AB (ref 0.44–1.00)
CREATININE: 1.37 mg/dL — AB (ref 0.44–1.00)
Calcium: 8.3 mg/dL — ABNORMAL LOW (ref 8.9–10.3)
Calcium: 8 mg/dL — ABNORMAL LOW (ref 8.9–10.3)
Calcium: 8.2 mg/dL — ABNORMAL LOW (ref 8.9–10.3)
Chloride: 107 mmol/L (ref 101–111)
GFR calc Af Amer: 41 mL/min — ABNORMAL LOW (ref >60)
GFR calc Af Amer: 48 mL/min — ABNORMAL LOW (ref >60)
GFR calc non Af Amer: 35 mL/min — ABNORMAL LOW (ref >60)
GFR calc non Af Amer: 37 mL/min — ABNORMAL LOW (ref >60)
GFR calc non Af Amer: 41 mL/min — ABNORMAL LOW (ref >60)
GFR, EST AFRICAN AMERICAN: 43 mL/min — AB (ref >60)
GLUCOSE: 168 mg/dL — AB (ref 65–99)
GLUCOSE: 242 mg/dL — AB (ref 65–99)
Glucose, Bld: 169 mg/dL — ABNORMAL HIGH (ref 65–99)
POTASSIUM: 2.9 mmol/L — AB (ref 3.5–5.1)
POTASSIUM: 3.3 mmol/L — AB (ref 3.5–5.1)
Potassium: 4.8 mmol/L (ref 3.5–5.1)
SODIUM: 137 mmol/L (ref 135–145)
SODIUM: 137 mmol/L (ref 135–145)
Sodium: 138 mmol/L (ref 135–145)

## 2017-10-19 LAB — CBC WITH DIFFERENTIAL/PLATELET
Basophils Absolute: 0.1 10*3/uL (ref 0.0–0.1)
Basophils Relative: 1 %
EOS PCT: 4 %
Eosinophils Absolute: 0.4 10*3/uL (ref 0.0–0.7)
HEMATOCRIT: 36.9 % (ref 36.0–46.0)
Hemoglobin: 12 g/dL (ref 12.0–15.0)
LYMPHS ABS: 2.2 10*3/uL (ref 0.7–4.0)
Lymphocytes Relative: 24 %
MCH: 27.5 pg (ref 26.0–34.0)
MCHC: 32.5 g/dL (ref 30.0–36.0)
MCV: 84.4 fL (ref 78.0–100.0)
MONO ABS: 0.8 10*3/uL (ref 0.1–1.0)
MONOS PCT: 9 %
Neutro Abs: 5.7 10*3/uL (ref 1.7–7.7)
Neutrophils Relative %: 62 %
PLATELETS: 497 10*3/uL — AB (ref 150–400)
RBC: 4.37 MIL/uL (ref 3.87–5.11)
RDW: 16.5 % — AB (ref 11.5–15.5)
WBC: 9.2 10*3/uL (ref 4.0–10.5)

## 2017-10-19 LAB — LIPID PANEL
CHOL/HDL RATIO: 22.4 ratio
Cholesterol: 224 mg/dL — ABNORMAL HIGH (ref 0–200)
HDL: 10 mg/dL — ABNORMAL LOW (ref >40)
LDL Cholesterol: 166 mg/dL — ABNORMAL HIGH (ref 0–99)
Triglycerides: 238 mg/dL — ABNORMAL HIGH (ref <150)
VLDL: 48 mg/dL — AB (ref 0–40)

## 2017-10-19 LAB — RESPIRATORY PANEL BY PCR
ADENOVIRUS-RVPPCR: NOT DETECTED
Bordetella pertussis: NOT DETECTED
CORONAVIRUS HKU1-RVPPCR: NOT DETECTED
CORONAVIRUS NL63-RVPPCR: NOT DETECTED
CORONAVIRUS OC43-RVPPCR: NOT DETECTED
Chlamydophila pneumoniae: NOT DETECTED
Coronavirus 229E: NOT DETECTED
INFLUENZA A-RVPPCR: NOT DETECTED
Influenza B: NOT DETECTED
METAPNEUMOVIRUS-RVPPCR: NOT DETECTED
Mycoplasma pneumoniae: NOT DETECTED
PARAINFLUENZA VIRUS 2-RVPPCR: NOT DETECTED
PARAINFLUENZA VIRUS 3-RVPPCR: NOT DETECTED
PARAINFLUENZA VIRUS 4-RVPPCR: NOT DETECTED
Parainfluenza Virus 1: NOT DETECTED
RHINOVIRUS / ENTEROVIRUS - RVPPCR: NOT DETECTED
Respiratory Syncytial Virus: NOT DETECTED

## 2017-10-19 LAB — PHOSPHORUS: Phosphorus: 3.7 mg/dL (ref 2.5–4.6)

## 2017-10-19 LAB — I-STAT ARTERIAL BLOOD GAS, ED
Acid-base deficit: 2 mmol/L (ref 0.0–2.0)
Acid-base deficit: 6 mmol/L — ABNORMAL HIGH (ref 0.0–2.0)
Bicarbonate: 15.9 mmol/L — ABNORMAL LOW (ref 20.0–28.0)
Bicarbonate: 19.7 mmol/L — ABNORMAL LOW (ref 20.0–28.0)
O2 SAT: 100 %
O2 Saturation: 98 %
PCO2 ART: 23 mmHg — AB (ref 32.0–48.0)
PCO2 ART: 24.8 mmHg — AB (ref 32.0–48.0)
PH ART: 7.508 — AB (ref 7.350–7.450)
PO2 ART: 101 mmHg (ref 83.0–108.0)
Patient temperature: 99
TCO2: 17 mmol/L — AB (ref 22–32)
TCO2: 20 mmol/L — ABNORMAL LOW (ref 22–32)
pH, Arterial: 7.449 (ref 7.350–7.450)
pO2, Arterial: 241 mmHg — ABNORMAL HIGH (ref 83.0–108.0)

## 2017-10-19 LAB — GLUCOSE, CAPILLARY
GLUCOSE-CAPILLARY: 184 mg/dL — AB (ref 65–99)
GLUCOSE-CAPILLARY: 251 mg/dL — AB (ref 65–99)
Glucose-Capillary: 255 mg/dL — ABNORMAL HIGH (ref 65–99)

## 2017-10-19 LAB — ECHOCARDIOGRAM COMPLETE

## 2017-10-19 LAB — BRAIN NATRIURETIC PEPTIDE: B Natriuretic Peptide: 839.6 pg/mL — ABNORMAL HIGH (ref 0.0–100.0)

## 2017-10-19 LAB — TROPONIN I
TROPONIN I: 0.12 ng/mL — AB (ref <0.03)
TROPONIN I: 0.06 ng/mL — AB (ref <0.03)
Troponin I: 0.04 ng/mL (ref <0.03)

## 2017-10-19 LAB — SEDIMENTATION RATE: SED RATE: 119 mm/h — AB (ref 0–22)

## 2017-10-19 LAB — CBG MONITORING, ED: GLUCOSE-CAPILLARY: 165 mg/dL — AB (ref 65–99)

## 2017-10-19 LAB — MAGNESIUM: Magnesium: 1.2 mg/dL — ABNORMAL LOW (ref 1.7–2.4)

## 2017-10-19 LAB — MRSA PCR SCREENING: MRSA by PCR: NEGATIVE

## 2017-10-19 MED ORDER — BUDESONIDE 0.25 MG/2ML IN SUSP
0.2500 mg | Freq: Two times a day (BID) | RESPIRATORY_TRACT | Status: DC
  Administered 2017-10-19 – 2017-10-23 (×9): 0.25 mg via RESPIRATORY_TRACT
  Filled 2017-10-19 (×12): qty 2

## 2017-10-19 MED ORDER — POTASSIUM CHLORIDE CRYS ER 20 MEQ PO TBCR
40.0000 meq | EXTENDED_RELEASE_TABLET | Freq: Two times a day (BID) | ORAL | Status: DC
  Administered 2017-10-19 – 2017-10-20 (×2): 40 meq via ORAL
  Filled 2017-10-19 (×2): qty 2

## 2017-10-19 MED ORDER — SODIUM CHLORIDE 0.9 % IV SOLN: 250.0000 mL | INTRAVENOUS | Status: DC | PRN

## 2017-10-19 MED ORDER — HYDROCOD POLST-CPM POLST ER 10-8 MG/5ML PO SUER
5.0000 mL | Freq: Two times a day (BID) | ORAL | Status: DC | PRN
  Administered 2017-10-19: 5 mL via ORAL
  Filled 2017-10-19: qty 5

## 2017-10-19 MED ORDER — ASPIRIN 81 MG PO CHEW
81.0000 mg | CHEWABLE_TABLET | Freq: Every day | ORAL | Status: DC
  Administered 2017-10-19 – 2017-10-23 (×5): 81 mg via ORAL
  Filled 2017-10-19 (×5): qty 1

## 2017-10-19 MED ORDER — METOPROLOL TARTRATE 5 MG/5ML IV SOLN
5.0000 mg | Freq: Four times a day (QID) | INTRAVENOUS | Status: DC
  Administered 2017-10-19: 5 mg via INTRAVENOUS
  Filled 2017-10-19: qty 5

## 2017-10-19 MED ORDER — TIOTROPIUM BROMIDE MONOHYDRATE 18 MCG IN CAPS: 18.0000 ug | ORAL_CAPSULE | Freq: Every day | RESPIRATORY_TRACT | Status: DC | PRN

## 2017-10-19 MED ORDER — ENOXAPARIN SODIUM 40 MG/0.4ML ~~LOC~~ SOLN
40.0000 mg | SUBCUTANEOUS | Status: DC
  Administered 2017-10-19 – 2017-10-22 (×4): 40 mg via SUBCUTANEOUS
  Filled 2017-10-19 (×5): qty 0.4

## 2017-10-19 MED ORDER — MAGNESIUM SULFATE 4 GM/100ML IV SOLN
4.0000 g | Freq: Once | INTRAVENOUS | Status: AC
  Administered 2017-10-19: 4 g via INTRAVENOUS
  Filled 2017-10-19 (×2): qty 100

## 2017-10-19 MED ORDER — ONDANSETRON HCL 4 MG/2ML IJ SOLN: 4.0000 mg | Freq: Four times a day (QID) | INTRAMUSCULAR | Status: DC | PRN

## 2017-10-19 MED ORDER — FUROSEMIDE 10 MG/ML IJ SOLN
80.0000 mg | Freq: Once | INTRAMUSCULAR | Status: AC
  Administered 2017-10-19: 80 mg via INTRAVENOUS
  Filled 2017-10-19: qty 8

## 2017-10-19 MED ORDER — HYDROCORTISONE NA SUCCINATE PF 100 MG IJ SOLR: 50.0000 mg | Freq: Once | INTRAMUSCULAR | Status: DC

## 2017-10-19 MED ORDER — ALTEPLASE 2 MG IJ SOLR
2.0000 mg | Freq: Once | INTRAMUSCULAR | Status: AC
  Administered 2017-10-19: 2 mg
  Filled 2017-10-19: qty 2

## 2017-10-19 MED ORDER — TIOTROPIUM BROMIDE MONOHYDRATE 18 MCG IN CAPS
18.0000 ug | ORAL_CAPSULE | Freq: Every day | RESPIRATORY_TRACT | Status: DC
Start: 2017-10-19 — End: 2017-10-23
  Administered 2017-10-19 – 2017-10-23 (×5): 18 ug via RESPIRATORY_TRACT
  Filled 2017-10-19: qty 5

## 2017-10-19 MED ORDER — SODIUM CHLORIDE 0.9% FLUSH
3.0000 mL | Freq: Two times a day (BID) | INTRAVENOUS | Status: DC
  Administered 2017-10-19 – 2017-10-23 (×5): 3 mL via INTRAVENOUS

## 2017-10-19 MED ORDER — SODIUM CHLORIDE 0.9% FLUSH: 3.0000 mL | INTRAVENOUS | Status: DC | PRN

## 2017-10-19 MED ORDER — METHYLPREDNISOLONE SODIUM SUCC 40 MG IJ SOLR
40.0000 mg | Freq: Two times a day (BID) | INTRAMUSCULAR | Status: DC
  Administered 2017-10-19: 40 mg via INTRAVENOUS
  Filled 2017-10-19: qty 1

## 2017-10-19 MED ORDER — METHYLPREDNISOLONE SODIUM SUCC 40 MG IJ SOLR
40.0000 mg | Freq: Once | INTRAMUSCULAR | Status: DC
Start: 2017-10-19 — End: 2017-10-20

## 2017-10-19 MED ORDER — CHLORHEXIDINE GLUCONATE 0.12 % MT SOLN
15.0000 mL | Freq: Two times a day (BID) | OROMUCOSAL | Status: DC
  Administered 2017-10-19 – 2017-10-22 (×5): 15 mL via OROMUCOSAL
  Filled 2017-10-19 (×3): qty 15

## 2017-10-19 MED ORDER — FUROSEMIDE 10 MG/ML IJ SOLN
80.0000 mg | Freq: Two times a day (BID) | INTRAMUSCULAR | Status: DC
  Administered 2017-10-19 – 2017-10-21 (×4): 80 mg via INTRAVENOUS
  Filled 2017-10-19 (×4): qty 8

## 2017-10-19 MED ORDER — ACETAMINOPHEN 325 MG PO TABS
650.0000 mg | ORAL_TABLET | ORAL | Status: DC | PRN
  Administered 2017-10-19: 650 mg via ORAL
  Filled 2017-10-19: qty 2

## 2017-10-19 MED ORDER — METHYLPREDNISOLONE SODIUM SUCC 125 MG IJ SOLR
60.0000 mg | Freq: Two times a day (BID) | INTRAMUSCULAR | Status: DC
  Administered 2017-10-19 – 2017-10-20 (×2): 60 mg via INTRAVENOUS
  Filled 2017-10-19 (×2): qty 0.96

## 2017-10-19 MED ORDER — INSULIN ASPART 100 UNIT/ML ~~LOC~~ SOLN
0.0000 [IU] | SUBCUTANEOUS | Status: DC
  Administered 2017-10-19 (×2): 3 [IU] via SUBCUTANEOUS
  Administered 2017-10-19: 5 [IU] via SUBCUTANEOUS
  Administered 2017-10-19: 3 [IU] via SUBCUTANEOUS
  Administered 2017-10-20 (×2): 11 [IU] via SUBCUTANEOUS
  Administered 2017-10-20: 15 [IU] via SUBCUTANEOUS
  Filled 2017-10-19: qty 1

## 2017-10-19 MED ORDER — SODIUM CHLORIDE 0.9% FLUSH
10.0000 mL | INTRAVENOUS | Status: DC | PRN
  Administered 2017-10-19: 10 mL
  Administered 2017-10-19: 20 mL
  Administered 2017-10-23: 10 mL
  Filled 2017-10-19 (×2): qty 40

## 2017-10-19 MED ORDER — POTASSIUM CHLORIDE 20 MEQ/15ML (10%) PO SOLN
40.0000 meq | ORAL | Status: AC
  Administered 2017-10-19 (×2): 40 meq via ORAL
  Filled 2017-10-19 (×3): qty 30

## 2017-10-19 MED ORDER — ORAL CARE MOUTH RINSE
15.0000 mL | Freq: Two times a day (BID) | OROMUCOSAL | Status: DC
  Administered 2017-10-19 – 2017-10-21 (×4): 15 mL via OROMUCOSAL

## 2017-10-19 MED ORDER — POTASSIUM CHLORIDE 10 MEQ/100ML IV SOLN
10.0000 meq | INTRAVENOUS | Status: AC
  Administered 2017-10-19 (×3): 10 meq via INTRAVENOUS
  Filled 2017-10-19 (×3): qty 100

## 2017-10-19 NOTE — ED Notes (Signed)
Notified Rennis Harding, NP of bladder scan 164 ml's - orders to I&O cath

## 2017-10-19 NOTE — ED Notes (Signed)
BIPAP restarted by RT due to worsening SOB/dry cough and low O2 sat=83%

## 2017-10-19 NOTE — Progress Notes (Signed)
Titrated off the NIV at this time. Pt appears much more comfortable than previous assessments. Respirations are no longer labored and tachypneic.

## 2017-10-19 NOTE — Progress Notes (Signed)
IV team called and verified patient's port can be used to draw labs by RN

## 2017-10-19 NOTE — Progress Notes (Signed)
Pt placed back on NIV due to O2 desaturation and worsening SOB.

## 2017-10-19 NOTE — Progress Notes (Signed)
  Echocardiogram 2D Echocardiogram has been performed.  Delcie Roch 10/19/2017, 4:28 PM

## 2017-10-19 NOTE — Progress Notes (Signed)
Addendum to H&P:    Polymyositis -While in [place] patient received IVIG every 6 weeks through currently established PICC line -Patient reports did follow-up with PCP after discharge and still in process of trying to establish with rheumatologist here in [place] so IVIG can be resumed  Junious Silk, ANP

## 2017-10-19 NOTE — H&P (Signed)
History and Physical    [name] OQH:[id number] DOB: 1955-12-28 DOA: 10/19/2017  **Will admit patient based on the expectation that the patient will need hospitalization/ hospital care that crosses at least 2 midnights  PCP: Patient, No Pcp Per   Attending physician: Lorin Mercy  Patient coming from/Resides with: Private residence  Chief Complaint: Shortness of breath  HPI: Elizabeth Montes is a 62 y.o. female with medical history significant for hypertension, dyslipidemia, diabetes on Victoza, obesity, polymyositis with history of IVIG infusions, chronic hypoxemic respiratory failure in the context of COPD but more likely related to ILD/pulmonary fibrosis with associated pulmonary hypertension, mild concentric hypertrophy with history of heart failure on chronic Lasix.  Patient was discharged on 3/11 after an admission for COPD exacerbation in context of underlying pulmonary fibrosis and chronic hypoxemia.  She was treated with IV steroids, Lasix and nebulizer treatments.  Of note this is the patient's 11th admission in the past 6 months.  She recently relocated from [place], [place] in January.  Patient reports that for the past 2 weeks she has been out of her medications and only recently followed up with her PCP to apparently obtain refills.  She did have oxygen available to her and her baseline is between 3-4 L/min.  Patient reports 1 week of progressive shortness of breath, orthopnea and dyspnea on exertion that has worsened over the past 24 hours.  Patient had been using her home nebulizers without improvement in symptoms.  Since arrival she has remained tachycardic tachypneic despite BiPAP with 50% FiO2.  An ABG was performed that revealed hypercapnia and appropriate oxygenation although this apparently was a compensatory mechanism in the context of acute kidney injury with an anion gap acidemia.  Patient has also received Lasix 80 mg IV x1.  On clinical exam she appeared to have a distended  bladder but bladder scan only revealed 164 cc.  Because of discrepancy between clinical exam and bladder scan and and out catheterization was ordered.  She is also not received any steroids since arrival so Solu-Medrol 40 mg IV x1 was given to replace her missed baseline of prednisone 40 daily.  **Of note, because patient remains tachypneic for greater than 6 hours we have opted to repeat an ABG and PCCM has been consulted.  Based on their evaluation patient meets requirements for admission to the ICU and [facility] and will assume care of this patient.  ED Course:  Vital Signs: BP 128/65   Pulse (!) 119   Temp 98.9 F (37.2 C) (Temporal)   Resp (!) 44   SpO2 95%  CXR: Neg except for chronic interstitial changes consistent with known pulmonary fibrosis, patient is either hypoventilatory or has bilateral pleural effusions based on my read of the chest x-ray Lab data: Sodium 137, potassium 2.9, chloride 102, CO2 18, glucose 168, BUN 11, creatinine 1.57, calcium 8.3, anion gap 17, BNP 840, white count 9200 normal differential, hemoglobin 12, platelets 497,000 Medications and treatments: Lasix 80 mg IV x1, potassium 10 mEq bolus x2  Review of Systems:  In addition to the HPI above,  No Fever-chills, myalgias or other constitutional symptoms No Headache, changes with Vision or hearing, new weakness, tingling, numbness in any extremity, dizziness, dysarthria or word finding difficulty, gait disturbance or imbalance, tremors or seizure activity No problems swallowing food or Liquids, indigestion/reflux, choking or coughing while eating, abdominal pain with or after eating No Chest pain, palpitations No Abdominal pain, N/V, melena,hematochezia, dark tarry stools, constipation No dysuria, malodorous urine,  hematuria or flank pain No new skin rashes, lesions, masses or bruises, No new joint pains, aches, swelling or redness No recent unintentional weight loss No polyuria, polydypsia or polyphagia   Past  Medical History:  Diagnosis Date  . CAD (coronary artery disease) w/ stent 2017 ([place])   . CHF (congestive heart failure) ([place])   . COPD (chronic obstructive pulmonary disease) ([place])   . Diabetes mellitus without complication ([place])   . Heart attack [facility]) 06/09/2016   06/09/2016 cath/PCI (inf STEMI): 60-70% proximal LAD, LCx normal, RCA mid 80-90% -> Rebel 3.5 x 32 mm BMS to mRCA and Rebel 3.5 x 28 mm BMS to proximal LAD   . HLD (hyperlipidemia)   . Hypertension   . Obesity   . Polymyositis ([place])   . Pulmonary fibrosis ([place])   . Pulmonary hypertension ([place])     Past Surgical History:  Procedure Laterality Date  . ABDOMINAL SURGERY    . TRACHEOSTOMY      Social History   Socioeconomic History  . Marital status: Single    Spouse name: Not on file  . Number of children: Not on file  . Years of education: Not on file  . Highest education level: Not on file  Occupational History  . Not on file  Social Needs  . Financial resource strain: Not on file  . Food insecurity:    Worry: Not on file    Inability: Not on file  . Transportation needs:    Medical: Not on file    Non-medical: Not on file  Tobacco Use  . Smoking status: Never Smoker  . Smokeless tobacco: Never Used  Substance and Sexual Activity  . Alcohol use: No  . Drug use: No  . Sexual activity: Not on file  Lifestyle  . Physical activity:    Days per week: Not on file    Minutes per session: Not on file  . Stress: Not on file  Relationships  . Social connections:    Talks on phone: Not on file    Gets together: Not on file    Attends religious service: Not on file    Active member of club or organization: Not on file    Attends meetings of clubs or organizations: Not on file    Relationship status: Not on file  . Intimate partner violence:    Fear of current or ex partner: Not on file    Emotionally abused: Not on file    Physically abused: Not on file    Forced sexual activity: Not on file  Other  Topics Concern  . Not on file  Social History Narrative  . Not on file    Mobility: Independent Work history: Not obtained   Allergies  Allergen Reactions  . Lovenox [Enoxaparin Sodium] Other (See Comments)    "made me bleed"    Family History  Problem Relation Age of Onset  . Cancer Mother   . Stroke Brother   . Diabetes Sister   . Stroke Sister      Prior to Admission medications   Medication Sig Start Date End Date Taking? Authorizing Provider  amLODipine (NORVASC) 10 MG tablet Take 1 tablet (10 mg total) by mouth daily. 09/18/17   Kayleen Memos, DO  antiseptic oral rinse (BIOTENE) LIQD Take 15 mLs by mouth as needed for dry mouth. 08/27/17   [provider]  aspirin 81 MG EC tablet Take 1 tablet (81 mg total) by mouth daily. 09/18/17  Irene Pap N, DO  azaTHIOprine (IMURAN) 50 MG tablet Take 0.5 tablets (25 mg total) by mouth daily. 09/18/17   Kayleen Memos, DO  bisacodyl (DULCOLAX) 5 MG EC tablet Take 1 tablet (5 mg total) by mouth daily as needed for mild constipation. 09/17/17   Kayleen Memos, DO  blood glucose meter kit and supplies Dispense based on patient and insurance preference. Use up to four times daily as directed. (FOR ICD-10 E10.9, E11.9). 10/04/17   Alma Friendly, MD  budesonide (PULMICORT) 0.25 MG/2ML nebulizer solution Take 2 mLs (0.25 mg total) by nebulization 2 (two) times daily. 10/04/17   Alma Friendly, MD  fenofibrate 160 MG tablet Take 1 tablet (160 mg total) by mouth daily. 09/18/17   Kayleen Memos, DO  furosemide (LASIX) 80 MG tablet Take 1 tablet (80 mg total) by mouth daily. 09/18/17   Kayleen Memos, DO  insulin aspart (NOVOLOG FLEXPEN) 100 UNIT/ML FlexPen Inject 25 Units into the skin 3 (three) times daily with meals.     [provider]  liraglutide (VICTOZA) 18 MG/3ML SOPN Inject 1.2 mg into the skin daily. 07/28/17   [provider]  metoCLOPramide (REGLAN) 10 MG tablet Take 5 mg by mouth 3 (three) times  daily before meals.    [provider]  metoprolol tartrate (LOPRESSOR) 25 MG tablet Take 1 tablet (25 mg total) by mouth 2 (two) times daily. 09/17/17   Kayleen Memos, DO  oxyCODONE (OXY IR/ROXICODONE) 5 MG immediate release tablet Take 5 mg by mouth every 6 (six) hours as needed (for pain).    [provider]  pantoprazole (PROTONIX) 40 MG tablet Take 1 tablet (40 mg total) by mouth 2 (two) times daily before a meal. 10/04/17 11/03/17  Alma Friendly, MD  predniSONE (DELTASONE) 10 MG tablet Take 4 tablets for 2 days, then 3 tablets for 2 days, then continue with 2 tablets 10/04/17   Alma Friendly, MD  tiotropium (SPIRIVA HANDIHALER) 18 MCG inhalation capsule Place 18 mcg into inhaler and inhale daily as needed (for wheezing).     [provider]    Physical Exam: Vitals:   10/19/17 0700 10/19/17 0715 10/19/17 0730 10/19/17 0745  BP: 135/80 127/68 (!) 123/55 128/65  Pulse: (!) 113 (!) 113 (!) 113 (!) 119  Resp: (!) 36 (!) 36 (!) 31 (!) 44  Temp:      TempSrc:      SpO2: 98% 98% 99% 95%      Constitutional: NAD, mildly restless, uncomfortable 2/2 ongoing work of breathing Eyes: PERRL, lids and conjunctivae normal ENMT: Mucous membranes are dry. Posterior pharynx clear of any exudate or lesions.  Neck: normal, supple, no masses, no thyromegaly Respiratory: Patient with coarse bilateral lung sounds markedly diminished in the bases, increased work of breathing with tachypnea and use of accessory muscles despite application of BiPAP for greater than 6 hours Cardiovascular: Regular tachycardic rate with underlying sinus rhythm, no murmurs / rubs / gallops.  1-2+ bilateral lower extremity edema. 2+ pedal pulses. No carotid bruits.  Abdomen: Mild suprapubic tenderness palpation-demand distended, no masses palpated. No hepatosplenomegaly. Bowel sounds positive are hypoactive.  Musculoskeletal: no clubbing / cyanosis. No joint deformity upper and lower  extremities. Good ROM, no contractures. Normal muscle tone.  Skin: no rashes, lesions, ulcers. No induration Neurologic: CN 2-12 grossly intact. Sensation intact, DTR normal. Strength 4-5/5 x all 4 extremities.  Psychiatric: Normal judgment and insight. Alert and oriented x 3. Normal mood.  Other: PICC line in place right AC-site unremarkable   Labs on Admission: I have personally reviewed following labs and imaging studies  CBC: Recent Labs  Lab 10/19/17 0308  WBC 9.2  NEUTROABS 5.7  HGB 12.0  HCT 36.9  MCV 84.4  PLT 128*   Basic Metabolic Panel: Recent Labs  Lab 10/19/17 0308  NA 137  K 2.9*  CL 102  CO2 18*  GLUCOSE 168*  BUN 11  CREATININE 1.57*  CALCIUM 8.3*   GFR: Estimated Creatinine Clearance: 39.6 mL/min (A) (by C-G formula based on SCr of 1.57 mg/dL (H)). Liver Function Tests: No results for input(s): AST, ALT, ALKPHOS, BILITOT, PROT, ALBUMIN in the last 168 hours. No results for input(s): LIPASE, AMYLASE in the last 168 hours. No results for input(s): AMMONIA in the last 168 hours. Coagulation Profile: No results for input(s): INR, PROTIME in the last 168 hours. Cardiac Enzymes: No results for input(s): CKTOTAL, CKMB, CKMBINDEX, TROPONINI in the last 168 hours. BNP (last 3 results) No results for input(s): PROBNP in the last 8760 hours. HbA1C: No results for input(s): HGBA1C in the last 72 hours. CBG: Recent Labs  Lab 10/19/17 0803  GLUCAP 165*   Lipid Profile: No results for input(s): CHOL, HDL, LDLCALC, TRIG, CHOLHDL, LDLDIRECT in the last 72 hours. Thyroid Function Tests: No results for input(s): TSH, T4TOTAL, FREET4, T3FREE, THYROIDAB in the last 72 hours. Anemia Panel: No results for input(s): VITAMINB12, FOLATE, FERRITIN, TIBC, IRON, RETICCTPCT in the last 72 hours. Urine analysis:    Component Value Date/Time   COLORURINE YELLOW 07/16/2017 0526   APPEARANCEUR CLEAR 07/16/2017 0526   LABSPEC 1.010 07/16/2017 0526   PHURINE 5.0  07/16/2017 0526   GLUCOSEU >=500 (A) 07/16/2017 0526   HGBUR NEGATIVE 07/16/2017 0526   BILIRUBINUR NEGATIVE 07/16/2017 0526   KETONESUR NEGATIVE 07/16/2017 0526   PROTEINUR NEGATIVE 07/16/2017 0526   NITRITE NEGATIVE 07/16/2017 0526   LEUKOCYTESUR NEGATIVE 07/16/2017 0526   Sepsis Labs: @LABRCNTIP (procalcitonin:4,lacticidven:4) )No results found for this or any previous visit (from the past 240 hour(s)).   Radiological Exams on Admission: Dg Chest Port 1 View  Result Date: 10/19/2017 CLINICAL DATA:  62 year old female with shortness of breath. EXAM: PORTABLE CHEST 1 VIEW COMPARISON:  Chest radiograph dated 10/12/2017 FINDINGS: Left-sided PICC with tip in similar position. There is shallow inspiration with bibasilar atelectatic changes/scarring. There is diffuse chronic interstitial coarsening. No focal consolidation, pleural effusion, or pneumothorax. The cardiac silhouette is within normal limits. Coronary vascular calcification and stent as well as atherosclerotic calcification of the aortic arch. Lower cervical fixation hardware. No acute osseous pathology. IMPRESSION: No active disease. Electronically Signed   By: Anner Crete M.D.   On: 10/19/2017 01:19    EKG: (Independently reviewed) sinus tachycardia with ventricular rate 137 bpm, QTC 462 ms, early R wave rotation, nonspecific ST changes, no acute ischemic changes  Assessment/Plan Principal Problem:   Acute on chronic respiratory failure with hypoxia 2/2 COPD/ Pulmonary hypertension and CHF w/assoc. Pulmonary HTN -Patient presents with acute on chronic hypoxemic respiratory failure in the context of missed medications and associated exacerbation of ILD/pulmonary fibrosis as well as acute CHF exacerbation -Continue Lasix 80 mg IV every 12 hours -ABG repeated and demonstrates worsening hypercapnia with metabolic alkalosis pH 7.867 PCO2 24.8 and O2 down to 101 with a TCO2 of 20 and a bicarbonate of 19.7 -Echocardiogram -Daily  weights, strict I's/O -No ARB/ACE I secondary to AKI -Continue beta-blocker IV -Solu-Medrol 60 mg IV every 12 hours -Pulmicort nebs -Low  threshold for intubation-patient now transferring to ICU under PCCM service  Active Problems:   Acute kidney injury   -Baseline renal function: Creatinine 1.00 -Current renal function: Creatinine 1.57 with a GFR of 35 -Follow labs -Avoid nephrotoxic medications    Acute hypokalemia -Oral repletion -BMETnow and again in a.m. -Magnesium and phosphorus now    Diabetes mellitus without complication  -Old Victoza context of acute kidney injury -SSI -HgbA1c was 8.5 during previous admission    CAD (coronary artery disease) -Currently patient with inappropriate tachycardia in context of missed beta-blocker dosages/acute respiratory failure/likely beta-blocker withdrawal for IV Lopressor 5 mg every 6 hours -Cycle troponin -Cardiac catheterization November 2017 after inferior STEMI demonstrated 60-70% proximal LAD lesion with left circumflex normal, RCA mid 80-90% lesion with Rebel bare-metal stents to RCA and LAD -Continue beta-blocker as above -Holding aspirin in context of NPO, not on statin prior to admission but was on fenofibrate    HTN (hypertension) -Current blood pressure somewhat suboptimal so holding preadmission Norvasc but continuing low-dose IV beta-blocker    Obesity    HLD (hyperlipidemia)  -Fenofibrate on hold on hold  **Additional lab, imaging and/or diagnostic evaluation at discretion of supervising physician  DVT prophylaxis: Lovenox Code Status: Full Family Communication: No family at bedside Disposition Plan: Home Consults called: [facility]/PCCM    Cait Locust L. ANP-BC [facility] Pager [phone]   If 7PM-7AM, please contact night-coverage [email or web address] Password [facility]  10/19/2017, 8:31 AM

## 2017-10-19 NOTE — ED Notes (Signed)
Unable to obtain blood work x2.  

## 2017-10-19 NOTE — ED Notes (Signed)
Attempted report x1. 

## 2017-10-19 NOTE — Consult Note (Addendum)
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 She moved to [place] about 7 mo. Ago and this presentation marks > 10 admissions for acute on chronic respiratory failure. She was just discharged from [facility] on 3/12 following an admit for what was felt to be acute on chronic resp failure d/t AECOPD +/- IPF flare w/ volume overload. She was dc'd on pred taper, BDs, and lasix.  She says she was doing well for about the first week after discharge, but then began to develop cough which became more progressive, worsening shortness of breath, intermittent chest discomfort, and nausea which prevented her from taking her pills.  She stated she continued to feel weaker, she was unable to eat, and also not long after her discharge back to home her daughter and granddaughter both had "the flu".  She presents to the emergency room on 3/26. With persistent cough, productive of thick clear sputum.  Resting shortness of breath, and low pulse oximetry, saturations 83% on nasal cannula.  She denied fever, denied nasal congestion, denied headache.  Since hospital presentation she is been treated with BiPAP, IV Lasix, and IV Solu-Medrol.  In spite of these therapies she is spent approximately 8 hours in the emergency room without significant improvement and therefore critical care was asked to evaluate.  PAST MEDICAL HISTORY :  She  has a past medical history of CAD (coronary artery  disease) w/ stent 2017 ([place]), CHF (congestive heart failure) ([place]), COPD (chronic obstructive pulmonary disease) ([place]), Diabetes mellitus without complication ([place]), Heart attack ([place]) (06/09/2016), HLD (hyperlipidemia), Hypertension, Obesity, Polymyositis ([place]), Pulmonary fibrosis ([place]), and Pulmonary hypertension ([place]).  PAST SURGICAL HISTORY: She  has a past surgical history that includes Tracheostomy and Abdominal surgery.  Allergies  Allergen Reactions  . Lovenox [Enoxaparin Sodium] Other (See Comments)    "made me bleed"    No current facility-administered medications on file prior to encounter.    Current Outpatient Medications on File Prior to Encounter  Medication Sig  . amLODipine (NORVASC) 10 MG tablet Take 1 tablet (10 mg total) by mouth daily.  Marland Kitchen antiseptic oral rinse (BIOTENE) LIQD Take 15 mLs by mouth as needed for dry mouth.  Marland Kitchen aspirin 81 MG EC tablet Take 1 tablet (81 mg total) by mouth daily.  Marland Kitchen azaTHIOprine (IMURAN) 50 MG tablet Take 0.5 tablets (25 mg total) by mouth daily.  . bisacodyl (DULCOLAX) 5 MG EC tablet Take 1 tablet (5 mg total) by mouth daily as needed for mild constipation.  . blood glucose meter kit and supplies Dispense based on patient and insurance preference. Use up to four times daily as directed. (FOR ICD-10 E10.9, E11.9).  . budesonide (PULMICORT) 0.25 MG/2ML nebulizer solution Take 2 mLs (0.25 mg total) by nebulization 2 (two) times daily.  . fenofibrate 160 MG tablet Take 1 tablet (160 mg total) by mouth daily.  . furosemide (LASIX) 80 MG tablet Take 1 tablet (80  mg total) by mouth daily.  . insulin aspart (NOVOLOG FLEXPEN) 100 UNIT/ML FlexPen Inject 25 Units into the skin 3 (three) times daily with meals.   . liraglutide (VICTOZA) 18 MG/3ML SOPN Inject 1.2 mg into the skin daily.  . metoCLOPramide (REGLAN) 10 MG tablet Take 5 mg by mouth 3 (three) times daily before meals.  . metoprolol tartrate (LOPRESSOR) 25 MG tablet Take 1 tablet (25 mg  total) by mouth 2 (two) times daily.  Marland Kitchen oxyCODONE (OXY IR/ROXICODONE) 5 MG immediate release tablet Take 5 mg by mouth every 6 (six) hours as needed (for pain).  . pantoprazole (PROTONIX) 40 MG tablet Take 1 tablet (40 mg total) by mouth 2 (two) times daily before a meal.  . predniSONE (DELTASONE) 10 MG tablet Take 4 tablets for 2 days, then 3 tablets for 2 days, then continue with 2 tablets  . tiotropium (SPIRIVA HANDIHALER) 18 MCG inhalation capsule Place 18 mcg into inhaler and inhale daily as needed (for wheezing).     FAMILY HISTORY:  Her indicated that her mother is deceased. She indicated that her sister is deceased. She indicated that her brother is deceased.   SOCIAL HISTORY: She  reports that she has never smoked. She has never used smokeless tobacco. She reports that she does not drink alcohol or use drugs.  REVIEW OF SYSTEMS:   General: Generalized weakness, positive for sick exposure, denied fever, does have significant weakness.  HEENT: No headache, sore throat, nasal congestion.  Pulmonary: Resting shortness of breath, cough persistent productive of thick clear mucus, intermittent chest discomfort, no wheezing.  Cardiac: Intermittent chest discomfort, does endorse lower extremity swelling as well as abdominal bloating.  No palpitations.  Extremities/musculoskeletal: Generalized weakness but no focal weakness.  Swelling is noted in cardiac section.  Abdomen: Intermittent nausea and vomiting, poor p.o. intake, no pain.  GU: No urinary frequency hesitancy or dysuria neuro: No headache, gait disturbance, loss of consciousness, seizure.  Endocrine: No hot or cold disturbances.  SUBJECTIVE:  Feels about the same after being transitioned to high flow oxygen  VITAL SIGNS: Blood Pressure 128/65   Pulse (Abnormal) 119   Temperature 98.9 F (37.2 C) (Temporal)   Respiration (Abnormal) 44   Oxygen Saturation 95%   HEMODYNAMICS:    VENTILATOR SETTINGS: Vent Mode: BIPAP;PCV FiO2  (%):  [50 %-100 %] 50 % Set Rate:  [8 bmp-15 bmp] 8 bmp PEEP:  [5 cmH20] 5 cmH20  INTAKE / OUTPUT: No intake/output data recorded.  PHYSICAL EXAMINATION: General:  This is a chronically ill-appearing 62 year old female.  She appears much older than stated age.  She is currently on high flow oxygen, she is able to complete 3-4 word phrases on this, denies significant difference between this and BiPAP Neuro: Awake alert oriented no focal deficits HEENT: Normocephalic atraumatic does have facial adiposity.  Mucous membranes are moist, there is no jugular venous distention Cardiovascular: Regular rate and rhythm no murmur rub or gallop Lungs: Resting accessory use.  Bibasilar rales, no wheezing Abdomen: Soft, nontender, positive bowel sounds Musculoskeletal: Equal strength and bulk Skin: Warm and dry, bilateral lower extremity edema  LABS:  BMET Recent Labs  Lab 10/19/17 0308  NA 137  K 2.9*  CL 102  CO2 18*  BUN 11  CREATININE 1.57*  GLUCOSE 168*    Electrolytes Recent Labs  Lab 10/19/17 0308  CALCIUM 8.3*    CBC Recent Labs  Lab 10/19/17 0308  WBC 9.2  HGB 12.0  HCT 36.9  PLT 497*  Coag's No results for input(s): APTT, INR in the last 168 hours.  Sepsis Markers No results for input(s): LATICACIDVEN, PROCALCITON, O2SATVEN in the last 168 hours.  ABG Recent Labs  Lab 10/19/17 0050 10/19/17 0834  PHART 7.449 7.508*  PCO2ART 23.0* 24.8*  PO2ART 241.0* 101.0    Liver Enzymes No results for input(s): AST, ALT, ALKPHOS, BILITOT, ALBUMIN in the last 168 hours.  Cardiac Enzymes No results for input(s): TROPONINI, PROBNP in the last 168 hours.  Glucose Recent Labs  Lab 10/19/17 0803  GLUCAP 165*    Imaging Dg Chest Port 1 View  Result Date: 10/19/2017 CLINICAL DATA:  62 year old female with shortness of breath. EXAM: PORTABLE CHEST 1 VIEW COMPARISON:  Chest radiograph dated 10/12/2017 FINDINGS: Left-sided PICC with tip in similar position. There  is shallow inspiration with bibasilar atelectatic changes/scarring. There is diffuse chronic interstitial coarsening. No focal consolidation, pleural effusion, or pneumothorax. The cardiac silhouette is within normal limits. Coronary vascular calcification and stent as well as atherosclerotic calcification of the aortic arch. Lower cervical fixation hardware. No acute osseous pathology. IMPRESSION: No active disease. Electronically Signed   By: Anner Crete M.D.   On: 10/19/2017 01:19     STUDIES:  Echo 3/26  CULTURES: Blood cultures x2 3/26 Respiratory viral panel 3/26  ANTIBIOTICS:   SIGNIFICANT EVENTS:   LINES/TUBES: Left upper extremity PICC line, this is been in for approximately 6 months  DISCUSSION: Acute on chronic hypoxic respiratory failure in the setting of decompensated diastolic heart failure with pulmonary edema superimposed on secondary pulmonary artery hypertension, and underlying presumptive diagnosis of IPF, COPD.  I suspect her inability to take her home medications is the greatest contributor to her decompensation.  I am not convinced she is infected, but she did have flu exposure.  We will treat her with pulse steroids, aggressive diuresis, high flow oxygen and nocturnal BiPAP.  I think we need to consider skilled nursing at discharge  ASSESSMENT / PLAN:  Acute on chronic hypoxic respiratory failure.  This seems to be multifactorial, but most likely acute diastolic heart failure with pulmonary edema superimposed on underlying presumptive diagnosis of IPF, COPD, and resultant secondary PAH.  Suspect that this has been exacerbated by her inability to take her home medications, but also need to consider possible recent viral exposure Plan Admit to the intensive care for close pulse oximetry monitoring and observation of her respiratory status Will alternate heated high flow oxygen with mandatory at bedtime BiPAP Continue aggressive IV diuresis Pulse steroid  taper Scheduled bronchodilators Check respiratory viral panel, also pro calcitonin algorithm Agree with follow-up echocardiogram  History of Diastolic heart failure as well as pulmonary artery hypertension (secondary).  Given physical exam and history also would be concerned about decompensated cor pulmonale History of coronary artery disease with prior bare-metal stents to RCA and LAD Plan Admit to the intensive care Cycle cardiac enzymes Continue aspirin Continue beta-blockade IV diuresis Holding Norvasc  Acute kidney injury.  Baseline creatinine is 1.  GFR is 35.  Presents with creatinine of 1.57.   Plan Renal dose medications Diuresis as tolerated Follow-up a.m. Chemistry. Replaced, recheck.  Mild anion gap metabolic acidosis.  Suspect this is due to work of breathing and not sepsis.  Glucose is not severely elevated so doubt DKA. Plan Repeat blood chemistry Supportive care treating respiratory failure  Nausea and vomiting.?  Etiology.  Does have significant electrolyte derangements, However also has a history of pancreatitis in the past. Plan Check lipase Continue PRN antiemetics  Ensure electrolytes replaced  Diabetes Plan Sliding scale insulin  History of polymyositis Plan Continuing steroids  DVT prophylaxis: lmwh SUP: na  Diet: NPO except meds  Activity: BR Disposition : ICU   FAMILY  - Updates:    Erick Colace ACNP-BC [place] Pager # [phone] OR # [phone] if no answer  10/19/2017, 8:40 AM

## 2017-10-19 NOTE — ED Provider Notes (Signed)
[place] EMERGENCY DEPARTMENT Provider Note   CSN: [id number] Arrival date & time: 10/19/17  0030     History   Chief Complaint No chief complaint on file.   HPI Elizabeth Montes is a 62 y.o. female.  The history is provided by the patient.  She has history of diabetes, hypertension, COPD, chronic kidney disease and comes in with increasing dyspnea over the last 3 days.  There has been a nonproductive cough.  She denies fever or chills.  She denies nausea or vomiting.  She denies diaphoresis.  She has had some mild midsternal chest pain which she describes as sharp and worse with movement and deep breathing.  She states she has not been taking her medications for the last several weeks because she vomits when she takes them.  EMS noted patient was hypoxic, so they placed her on CPAP.  With CPAP, they were unable to get oxygen saturations over 90%.  Past Medical History:  Diagnosis Date  . COPD (chronic obstructive pulmonary disease) ([place])   . Diabetes mellitus without complication ([place])   . Heart attack [facility]) 06/09/2016   06/09/2016 cath/PCI (inf STEMI): 60-70% proximal LAD, LCx normal, RCA mid 80-90% -> Rebel 3.5 x 32 mm BMS to mRCA and Rebel 3.5 x 28 mm BMS to proximal LAD   . Hypertension     Patient Active Problem List   Diagnosis Date Noted  . Hypoxemia   . Acute pulmonary edema (HCC)   . Acute exacerbation of COPD with asthma ([place]) 09/30/2017  . Pulmonary hypertension ([place]) 09/30/2017  . CKD stage 2 due to type 2 diabetes mellitus ([place]) 09/30/2017  . Diabetes mellitus type 2 in nonobese ([place]) 09/13/2017  . Acute respiratory failure ([place]) 09/13/2017  . COPD exacerbation ([place]) 08/11/2017  . COPD with acute exacerbation ([place]) 08/11/2017  . Acute respiratory failure with hypoxia ([place]) 08/01/2017  . Healthcare-associated pneumonia 08/01/2017  . Diabetes mellitus ([place]) 08/01/2017  . Type 2 diabetes mellitus, uncontrolled ([place]) 07/16/2017  . Acute on  chronic respiratory failure with hypoxia ([place]) 07/16/2017  . COPD (chronic obstructive pulmonary disease) ([place]) 07/16/2017  . Hypertension 07/16/2017  . Polymyositis ([place]) 07/16/2017  . Acute renal failure superimposed on stage 2 chronic kidney disease ([place]) 07/16/2017    Past Surgical History:  Procedure Laterality Date  . ABDOMINAL SURGERY    . TRACHEOSTOMY       OB History   None      Home Medications    Prior to Admission medications   Medication Sig Start Date End Date Taking? Authorizing Provider  amLODipine (NORVASC) 10 MG tablet Take 1 tablet (10 mg total) by mouth daily. 09/18/17   Kayleen Memos, DO  antiseptic oral rinse (BIOTENE) LIQD Take 15 mLs by mouth as needed for dry mouth. 08/27/17   [provider]  aspirin 81 MG EC tablet Take 1 tablet (81 mg total) by mouth daily. 09/18/17   Kayleen Memos, DO  azaTHIOprine (IMURAN) 50 MG tablet Take 0.5 tablets (25 mg total) by mouth daily. 09/18/17   Kayleen Memos, DO  bisacodyl (DULCOLAX) 5 MG EC tablet Take 1 tablet (5 mg total) by mouth daily as needed for mild constipation. 09/17/17   Kayleen Memos, DO  blood glucose meter kit and supplies Dispense based on patient and insurance preference. Use up to four times daily as directed. (FOR ICD-10 E10.9, E11.9). 10/04/17   Alma Friendly, MD  budesonide (PULMICORT) 0.25 MG/2ML nebulizer solution Take  2 mLs (0.25 mg total) by nebulization 2 (two) times daily. 10/04/17   Alma Friendly, MD  fenofibrate 160 MG tablet Take 1 tablet (160 mg total) by mouth daily. 09/18/17   Kayleen Memos, DO  furosemide (LASIX) 80 MG tablet Take 1 tablet (80 mg total) by mouth daily. 09/18/17   Kayleen Memos, DO  insulin aspart (NOVOLOG FLEXPEN) 100 UNIT/ML FlexPen Inject 25 Units into the skin 3 (three) times daily with meals.     [provider]  liraglutide (VICTOZA) 18 MG/3ML SOPN Inject 1.2 mg into the skin daily. 07/28/17   [provider]  metoCLOPramide  (REGLAN) 10 MG tablet Take 5 mg by mouth 3 (three) times daily before meals.    [provider]  metoprolol tartrate (LOPRESSOR) 25 MG tablet Take 1 tablet (25 mg total) by mouth 2 (two) times daily. 09/17/17   Kayleen Memos, DO  oxyCODONE (OXY IR/ROXICODONE) 5 MG immediate release tablet Take 5 mg by mouth every 6 (six) hours as needed (for pain).    [provider]  pantoprazole (PROTONIX) 40 MG tablet Take 1 tablet (40 mg total) by mouth 2 (two) times daily before a meal. 10/04/17 11/03/17  Alma Friendly, MD  predniSONE (DELTASONE) 10 MG tablet Take 4 tablets for 2 days, then 3 tablets for 2 days, then continue with 2 tablets 10/04/17   Alma Friendly, MD  tiotropium (SPIRIVA HANDIHALER) 18 MCG inhalation capsule Place 18 mcg into inhaler and inhale daily as needed (for wheezing).     [provider]    Family History Family History  Problem Relation Age of Onset  . Cancer Mother   . Stroke Brother   . Diabetes Sister   . Stroke Sister     Social History Social History   Tobacco Use  . Smoking status: Never Smoker  . Smokeless tobacco: Never Used  Substance Use Topics  . Alcohol use: No  . Drug use: No     Allergies   Lovenox [enoxaparin sodium]   Review of Systems Review of Systems  All other systems reviewed and are negative.    Physical Exam Updated Vital Signs BP 114/76 (BP Location: Right Arm)   Pulse (!) 128   Temp 98.9 F (37.2 C) (Temporal)   Resp (!) 22   SpO2 98%   Physical Exam  Nursing note and vitals reviewed.  62 year old female on BiPAP.  She is tachypneic, but not in respiratory distress. Vital signs are significant for tachycardia and tachypnea. Oxygen saturation is 98%, which is normal. Head is normocephalic and atraumatic. PERRLA, EOMI. Oropharynx is clear. Neck is nontender and supple without adenopathy.  Mild JVD present. Back is nontender and there is no CVA tenderness. Lungs have rales at the right  base without wheezes or rhonchi. Chest is nontender. Heart has regular rate and rhythm without murmur. Abdomen is soft, flat, nontender without masses or hepatosplenomegaly and peristalsis is normoactive. Extremities have 2+ pretibial edema, and 1+ presacral edema, full range of motion is present. Skin is warm and dry without rash. Neurologic: Mental status is normal, cranial nerves are intact, there are no motor or sensory deficits.  ED Treatments / Results  Labs (all labs ordered are listed, but only abnormal results are displayed) Labs Reviewed  BASIC METABOLIC PANEL - Abnormal; Notable for the following components:      Result Value   Potassium 2.9 (*)    CO2 18 (*)    Glucose,  Bld 168 (*)    Creatinine, Ser 1.57 (*)    Calcium 8.3 (*)    GFR calc non Af Amer 35 (*)    GFR calc Af Amer 41 (*)    Anion gap 17 (*)    All other components within normal limits  CBC WITH DIFFERENTIAL/PLATELET - Abnormal; Notable for the following components:   RDW 16.5 (*)    Platelets 497 (*)    All other components within normal limits  BRAIN NATRIURETIC PEPTIDE - Abnormal; Notable for the following components:   B Natriuretic Peptide 839.6 (*)    All other components within normal limits  I-STAT ARTERIAL BLOOD GAS, ED - Abnormal; Notable for the following components:   pCO2 arterial 23.0 (*)    pO2, Arterial 241.0 (*)    Bicarbonate 15.9 (*)    TCO2 17 (*)    Acid-base deficit 6.0 (*)    All other components within normal limits  I-STAT TROPONIN, ED    EKG EKG Interpretation  Date/Time:  Tuesday October 19 2017 00:36:13 EDT Ventricular Rate:  137 PR Interval:    QRS Duration: 81 QT Interval:  306 QTC Calculation: 462 R Axis:   -3 Text Interpretation:  Sinus tachycardia Inferior infarct, old Lateral leads are also involved When compared with ECG of 10/11/2017, No significant change was found Confirmed by Delora Fuel ([id number]) on 10/19/2017 12:44:31 AM   Radiology No results  found.  Procedures Procedures  CRITICAL CARE Performed by: Delora Fuel Total critical care time: 90 minutes Critical care time was exclusive of separately billable procedures and treating other patients. Critical care was necessary to treat or prevent imminent or life-threatening deterioration. Critical care was time spent personally by me on the following activities: development of treatment plan with patient and/or surrogate as well as nursing, discussions with consultants, evaluation of patient's response to treatment, examination of patient, obtaining history from patient or surrogate, ordering and performing treatments and interventions, ordering and review of laboratory studies, ordering and review of radiographic studies, pulse oximetry and re-evaluation of patient's condition.  Medications Ordered in ED Medications  sodium chloride flush (NS) 0.9 % injection 10-40 mL (10 mLs Intracatheter Given 10/19/17 0620)  potassium chloride 10 mEq in 100 mL IVPB (10 mEq Intravenous New Bag/Given 10/19/17 0637)  alteplase (CATHFLO ACTIVASE) injection 2 mg (2 mg Intracatheter Given 10/19/17 0425)  furosemide (LASIX) injection 80 mg (80 mg Intravenous Given 10/19/17 0629)     Initial Impression / Assessment and Plan / ED Course  I have reviewed the triage vital signs and the nursing notes.  Pertinent labs & imaging results that were available during my care of the patient were reviewed by me and considered in my medical decision making (see chart for details).  Dyspnea which on exam, seems more likely to be CHF and COPD.  Old records are reviewed, and she was discharged from the hospital on March 12 having been admitted for his COPD exacerbation but also noted to have pulmonary edema.  Screening labs and x-ray have been ordered.  Chest x-ray is read as showing no acute change.  However, on my reading, it does appear to show pulmonary vascular congestion which had not been present previously.  Labs  show significant hypokalemia and potassium supplementation is initiated.  Also, mild acute kidney injury present.  BNP is moderately to markedly elevated consistent with CHF exacerbation.  She is given a dose of furosemide.  Will need to follow potassium closely since she will have potassium  loss with furosemide.  Case is discussed with Dr. Hal Hope of [facility], who agrees to admit the patient.  Final Clinical Impressions(s) / ED Diagnoses   Final diagnoses:  Acute on chronic congestive heart failure, unspecified heart failure type ([place])  Hypoxia  Acute kidney injury (nontraumatic) ([place])  Hypokalemia    ED Discharge Orders    None       Delora Fuel, MD 47/20/72 [phone]

## 2017-10-19 NOTE — Care Management Note (Signed)
Case Management Note  Patient Details  Name: Elizabeth Montes MRN: [id number] Date of Birth: 1955-09-22  Subjective/Objective:           READMISSION       62 y.o. female with a PMHx of pulmonary HTN; pulmonary fibrosis; polymyositis; HTN; HLD; DM; COPD; CAD with stent; and CHF with normal who presents with 1 week of SOB.  From home with daughter.  Action/Plan: Admit status INPATIENT (HYPOXIC RESP FAILURE); anticipate discharge HOME WITH HOME HEALTH.   Expected Discharge Date:                  Expected Discharge Plan:  Home w Home Health Services  In-House Referral:     Discharge planning Services  CM Consult  Post Acute Care Choice:    Choice offered to:     DME Arranged:    DME Agency:     HH Arranged:    HH Agency:  [facility]  Status of Service:  In process, will continue to follow  If discussed at Long Length of Stay Meetings, dates discussed:    Additional Comments: Pt was enrolled in [facility] with [facility] on last admission. Oletta Cohn, RN 10/19/2017, 10:10 AM

## 2017-10-19 NOTE — ED Triage Notes (Signed)
Pt arrived by EMS c/o SOB since yesterday. Pt is on 3L of home oxygen. Took a neb tx prior to EMS arrival. EMS reports pt was labored and tachypneic. Pt also reports noncompliance with medications for the past two weeks; states she vomits when trying to take medication

## 2017-10-20 ENCOUNTER — Inpatient Hospital Stay (HOSPITAL_COMMUNITY): Payer: Medicare HMO

## 2017-10-20 LAB — GLUCOSE, CAPILLARY
GLUCOSE-CAPILLARY: 321 mg/dL — AB (ref 65–99)
GLUCOSE-CAPILLARY: 315 mg/dL — AB (ref 65–99)
GLUCOSE-CAPILLARY: 403 mg/dL — AB (ref 65–99)
GLUCOSE-CAPILLARY: 404 mg/dL — AB (ref 65–99)
Glucose-Capillary: 299 mg/dL — ABNORMAL HIGH (ref 65–99)
Glucose-Capillary: 344 mg/dL — ABNORMAL HIGH (ref 65–99)
Glucose-Capillary: 406 mg/dL — ABNORMAL HIGH (ref 65–99)

## 2017-10-20 LAB — BASIC METABOLIC PANEL
ANION GAP: 11 (ref 5–15)
BUN: 19 mg/dL (ref 6–20)
CHLORIDE: 105 mmol/L (ref 101–111)
CO2: 19 mmol/L — AB (ref 22–32)
Calcium: 8.3 mg/dL — ABNORMAL LOW (ref 8.9–10.3)
Creatinine, Ser: 1.37 mg/dL — ABNORMAL HIGH (ref 0.44–1.00)
GFR calc Af Amer: 48 mL/min — ABNORMAL LOW (ref >60)
GFR calc non Af Amer: 41 mL/min — ABNORMAL LOW (ref >60)
GLUCOSE: 344 mg/dL — AB (ref 65–99)
POTASSIUM: 5.3 mmol/L — AB (ref 3.5–5.1)
Sodium: 135 mmol/L (ref 135–145)

## 2017-10-20 LAB — PROTIME-INR
INR: 1.12
Prothrombin Time: 14.3 seconds (ref 11.4–15.2)

## 2017-10-20 LAB — PROCALCITONIN
PROCALCITONIN: 0.2 ng/mL
Procalcitonin: 0.18 ng/mL

## 2017-10-20 LAB — LIPASE, BLOOD: Lipase: 24 U/L (ref 11–51)

## 2017-10-20 MED ORDER — INSULIN GLARGINE 100 UNIT/ML ~~LOC~~ SOLN
25.0000 [IU] | Freq: Every day | SUBCUTANEOUS | Status: DC
  Administered 2017-10-20 – 2017-10-21 (×2): 25 [IU] via SUBCUTANEOUS
  Filled 2017-10-20 (×2): qty 0.25

## 2017-10-20 MED ORDER — PANTOPRAZOLE SODIUM 40 MG PO TBEC
40.0000 mg | DELAYED_RELEASE_TABLET | Freq: Every day | ORAL | Status: DC
  Administered 2017-10-20 – 2017-10-23 (×4): 40 mg via ORAL
  Filled 2017-10-20 (×4): qty 1

## 2017-10-20 MED ORDER — SODIUM CHLORIDE 0.9 % IV SOLN: Freq: Once | INTRAVENOUS | Status: AC

## 2017-10-20 MED ORDER — ATORVASTATIN CALCIUM 80 MG PO TABS
80.0000 mg | ORAL_TABLET | Freq: Every day | ORAL | Status: DC
  Administered 2017-10-20 – 2017-10-22 (×3): 80 mg via ORAL
  Filled 2017-10-20 (×3): qty 1

## 2017-10-20 MED ORDER — INSULIN ASPART 100 UNIT/ML ~~LOC~~ SOLN
25.0000 [IU] | Freq: Once | SUBCUTANEOUS | Status: AC
  Administered 2017-10-20: 25 [IU] via SUBCUTANEOUS

## 2017-10-20 MED ORDER — METHYLPREDNISOLONE SODIUM SUCC 125 MG IJ SOLR
80.0000 mg | Freq: Four times a day (QID) | INTRAMUSCULAR | Status: DC
  Administered 2017-10-20 – 2017-10-21 (×4): 80 mg via INTRAVENOUS
  Filled 2017-10-20: qty 1.28
  Filled 2017-10-20: qty 2
  Filled 2017-10-20 (×3): qty 1.28

## 2017-10-20 MED ORDER — INSULIN ASPART 100 UNIT/ML ~~LOC~~ SOLN
0.0000 [IU] | SUBCUTANEOUS | Status: DC
  Administered 2017-10-20: 20 [IU] via SUBCUTANEOUS
  Administered 2017-10-20 – 2017-10-21 (×3): 15 [IU] via SUBCUTANEOUS
  Administered 2017-10-21 (×2): 11 [IU] via SUBCUTANEOUS

## 2017-10-20 NOTE — Progress Notes (Signed)
Text paged and spoke to Cardiology NP, Laverda Page  about pt refusing 2nd IV site for Heart cath in am. Pt has an implanted port under the skin under her Lt arm that is accessed with a huber needle infusing well - also used for lab draws.

## 2017-10-20 NOTE — Progress Notes (Signed)
Text page to Dr Marchelle Gearing for pt - says she takes Daily Protonix - orders received

## 2017-10-20 NOTE — Progress Notes (Signed)
Inpatient Diabetes Program Recommendations  AACE/ADA: New Consensus Statement on Inpatient Glycemic Control (2015)  Target Ranges:  Prepandial:   less than 140 mg/dL      Peak postprandial:   less than 180 mg/dL (1-2 hours)      Critically ill patients:  140 - 180 mg/dL   Lab Results  Component Value Date   GLUCAP 315 (H) 10/20/2017   HGBA1C 8.5 (H) 09/13/2017    Review of Glycemic Control  Diabetes history:DM 2 Outpatient Diabetes medications:Toujeo 25 units + Victoza 1.2 mg Daily, Novolog 25 units tid with meals Current orders for Inpatient glycemic control: Novolog moderate correction q 4 hrs.  Inpatient Diabetes Program Recommendations:    Patient has received 37 units Novolog correction over the past 24 hrs. Lantus 15 units qd Novolog 5 units tid meal coverage if eats 50%  Thank you, Darel Hong E. Chevez Sambrano, RN, MSN, CDE  Diabetes Coordinator Inpatient Glycemic Control Team Team Pager [phone] (8am-5pm) 10/20/2017 11:55 AM

## 2017-10-20 NOTE — H&P (View-Only) (Signed)
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 She moved to [place] about 7 mo. Ago and this presentation marks > 10 admissions for acute on chronic respiratory failure. She was just discharged from [facility] on 3/12 following an admit for what was felt to be acute on chronic resp failure d/t AECOPD +/- IPF flare w/ volume overload. She was dc'd on pred taper, BDs, and lasix.  She says she was doing well for about the first week after discharge, but then began to develop cough which became more progressive, worsening shortness of breath, intermittent chest discomfort, and nausea which prevented her from taking her pills.  She stated she continued to feel weaker, she was unable to eat, and also not long after her discharge back to home her daughter and granddaughter both had "the flu".  She presents to the emergency room on 3/26. With persistent cough, productive of thick clear sputum.  Resting shortness of breath, and low pulse oximetry, saturations 83% on nasal cannula.  She denied fever, denied nasal congestion, denied headache.  Since hospital presentation she is been treated with BiPAP, IV Lasix, and IV Solu-Medrol.  In spite of these therapies she is spent approximately 8 hours in the emergency room without significant improvement and therefore critical care was asked to evaluate.   LINES/TUBES: Left upper extremity PICC line, this is been in for approximately 6  months  EVENTS 3/26 - Feels about the same after being transitioned to high flow oxygen   SUBJECTIVE/OVERNIGHT/INTERVAL HX 10/20/2017  - Say PM dx x 2.5  Years wit IvIG and prednisone as Rx. Reports 3-4L [place] at hiome. Here now down to 60L HFNC with BiPAP  QHS. Diuresing and on Steroids (ESR > 110).  ECHO 3/26 with PASP 71  VITAL SIGNS: BP 132/81   Pulse 92   Temp (!) 97 F (36.1 C) (Oral)   Resp (!) 26   Wt 84.1 kg (185 lb 6.5 oz)   SpO2 97%   BMI 32.84 kg/m   HEMODYNAMICS:    VENTILATOR SETTINGS: Vent Mode: BIPAP FiO2 (%):  [50 %-90 %] 60 % Set Rate:  [8 bmp] 8 bmp PEEP:  [5 cmH20] 5 cmH20  INTAKE / OUTPUT: I/O last 3 completed shifts: In: 310 [I.V.:10; IV Piggyback:300] Out: 600 [Urine:600]  PHYSICAL EXAMINATION:  General Appearance:    Looks chronic ill. Obese  Head:    Normocephalic, without obvious abnormality, atraumatic  Eyes:    PERRL - yes, conjunctiva/corneas - y3s      Ears:    Normal external ear canals, both ears  Nose:   NG tube - no but has  High flow nasal cannula  Throat:  ETT TUBE - no , OG tube - no  Neck:   Supple,  No enlargement/tenderness/nodules     Lungs:     No distress but has crackles scatteree  Chest wall:    No deformity  Heart:      S1 and S2 normal, no murmur, CVP - no.  Pressors - no  Abdomen:     Soft, no masses, no organomegaly  Genitalia:    Not done  Rectal:   not done  Extremities:   Extremities- intact     Skin:   Intact in exposed areas .      Neurologic:   Sedation - non -> RASS - +1 . Moves all 4s - yes. CAM-ICU - neg . Orientation - z3 +. Watching TV     LABS: PULMONARY Recent Labs  Lab 10/19/17 0050 10/19/17 0834  PHART 7.449 7.508*  PCO2ART 23.0* 24.8*  PO2ART 241.0* 101.0  HCO3 15.9* 19.7*  TCO2 17* 20*  O2SAT 100.0 98.0    CBC Recent Labs  Lab 10/19/17 0308  HGB 12.0  HCT 36.9  WBC 9.2  PLT 497*    COAGULATION No results for input(s): INR in the last 168 hours.  CARDIAC   Recent Labs   Lab 10/19/17 0744 10/19/17 1412 10/19/17 1931  TROPONINI 0.12* 0.06* 0.04*   No results for input(s): PROBNP in the last 168 hours.   CHEMISTRY Recent Labs  Lab 10/19/17 0308 10/19/17 0744 10/19/17 1412 10/20/17 0433  NA 137 137 138 135  K 2.9* 3.3* 4.8 5.3*  CL 102 103 107 105  CO2 18* 19* 18* 19*  GLUCOSE 168* 169* 242* 344*  BUN 11 10 13 19  CREATININE 1.57* 1.50* 1.37* 1.37*  CALCIUM 8.3* 8.0* 8.2* 8.3*  MG  --  1.2*  --   --   PHOS  --  3.7  --   --    Estimated Creatinine Clearance: 45.4 mL/min (A) (by C-G formula based on SCr of 1.37 mg/dL (H)).   LIVER No results for input(s): AST, ALT, ALKPHOS, BILITOT, PROT, ALBUMIN, INR in the last 168 hours.   INFECTIOUS Recent Labs  Lab 10/20/17 0433  PROCALCITON 0.18     ENDOCRINE CBG (last 3)  Recent Labs    10/20/17 0005 10/20/17 0428 10/20/17 0756  GLUCAP 406* 321* 315*         IMAGING x48h  - image(s) personally visualized  -   highlighted in bold Dg Chest Port 1 View  Result Date: 10/19/2017 CLINICAL DATA:  61-year-old female with shortness of breath. EXAM: PORTABLE CHEST 1 VIEW COMPARISON:  Chest radiograph dated 10/12/2017 FINDINGS: Left-sided PICC with tip in similar position. There is shallow inspiration with bibasilar atelectatic changes/scarring. There is diffuse chronic interstitial coarsening. No focal consolidation, pleural effusion, or pneumothorax. The cardiac silhouette is within normal limits. Coronary vascular calcification and stent as well as atherosclerotic calcification of the aortic arch. Lower cervical fixation hardware. No acute osseous pathology. IMPRESSION: No active disease. Electronically Signed   By: Arash  Radparvar M.D.   On: 10/19/2017 01:19     DISCUSSION: Acute on chronic hypoxic respiratory failure in the setting of decompensated diastolic heart failure with pulmonary edema superimposed on secondary pulmonary artery hypertension, and underlying presumptive diagnosis  of ILD with  COPD.  I suspect her inability to take her home medications is the greatest contributor to her decompensation.  I am not convinced she is infected, but she did have flu exposure.  We will treat her with pulse steroids, aggressive diuresis, high flow oxygen and nocturnal BiPAP.  I think we need to consider skilled nursing at discharge  ASSESSMENT / PLAN:  Acute on chronic hypoxic respiratory failure.  This seems to be multifactorial, but most likely acute diastolic heart failure   with pulmonary edema superimposed on underlying presumptive diagnosis of IPF, COPD, and resultant secondary PAH.  Suspect that this has been exacerbated by her inability to take her home medications, but also need to consider possible recent viral exposure  10/20/2017  - some improvement but still far away from baseline. Now on 60L [place] and BIPAP qhs. Susepct with ESR > 100 this is ILD flare esp with RVP negative  Plan Move back to [facility] Will alternate heated high flow oxygen with mandatory at bedtime BiPAP Continue aggressive IV diuresis (but do not increaes given PASP 71); might need right heart cath - calling cards Increase steroids Check autoimmuen and vasculitis panel for completion Scheduled bronchodilators Get HRCT    History of Diastolic heart failure as well as pulmonary artery hypertension (secondary).  Given physical exam and history also would be concerned about decompensated cor pulmonale History of coronary artery disease with prior bare-metal stents to RCA and LAD  - ECHO with PASP 71  Plan Cards master called for RHC - how much to diurese? And role for advanced PAH Rx given autoimmune dz Restart statin Continue aspirin Continue beta-blockade IV diuresis Holding Norvasc  Acute kidney injury.  Baseline creatinine is 1.  GFR is 35.  Presents with creatinine of 1.57.   Plan Renal dose medications Diuresis as tolerated Follow-up a.m. Chemistry. Replaced, recheck.  Mild anion gap  metabolic acidosis.  Suspect this is due to work of breathing and not sepsis.  Glucose is not severely elevated so doubt DKA. Plan Repeat blood chemistry Supportive care treating respiratory failure  Nausea and vomiting.?  Etiology.  Does have significant electrolyte derangements, However also has a history of pancreatitis in the past.   - 10/20/2017 - no nausea Plan Check lipase Continue PRN antiemetics Ensure electrolytes replaced  Diabetes Plan Sliding scale insulin  History of polymyositis Plan Continuing steroids  DVT prophylaxis: lmwh SUP: na  Diet: NPO except meds  Activity: BR Disposition : ICU  -> move to SDU  FAMILY  - Updates: patient updated  DISPO Move to sDU. BAck to [facility]. Pete Babcock witll call [place]. PCCM will follow. ILD clinic appt as below  Future Appointments  Date Time Provider Department Center  10/29/2017  9:15 AM Quintrell Baze, MD [facility] None    Dr. Ginnie Marich, M.D., F.C.C.P Pulmonary and Critical Care Medicine Staff Physician, [facility] Director - Interstitial Lung Disease  Program  [facility] [place], [place], [address]  Pager: [phone], If no answer or between  15:00h - 7:00h: call [phone] Telephone: [phone]    

## 2017-10-20 NOTE — Progress Notes (Signed)
    Discussed with patient the need for RHC tomorrow to further assess PAH. Discussed risk and benefit with the patient and she is willing proceed. The patient understands that risks included but are not limited to stroke (1 in 1000), death (1 in 1000), bleeding (1 in 200), allergic reaction [possibly serious] (1 in 200).   SignedLaverda Page, NP-C 10/20/2017, 2:10 PM Pager: [phone]

## 2017-10-20 NOTE — Progress Notes (Signed)
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 She moved to [place] about 7 mo. Ago and this presentation marks > 10 admissions for acute on chronic respiratory failure. She was just discharged from [facility] on 3/12 following an admit for what was felt to be acute on chronic resp failure d/t AECOPD +/- IPF flare w/ volume overload. She was dc'd on pred taper, BDs, and lasix.  She says she was doing well for about the first week after discharge, but then began to develop cough which became more progressive, worsening shortness of breath, intermittent chest discomfort, and nausea which prevented her from taking her pills.  She stated she continued to feel weaker, she was unable to eat, and also not long after her discharge back to home her daughter and granddaughter both had "the flu".  She presents to the emergency room on 3/26. With persistent cough, productive of thick clear sputum.  Resting shortness of breath, and low pulse oximetry, saturations 83% on nasal cannula.  She denied fever, denied nasal congestion, denied headache.  Since hospital presentation she is been treated with BiPAP, IV Lasix, and IV Solu-Medrol.  In spite of these therapies she is spent approximately 8 hours in the emergency room without significant improvement and therefore critical care was asked to evaluate.   LINES/TUBES: Left upper extremity PICC line, this is been in for approximately 6  months  EVENTS 3/26 - Feels about the same after being transitioned to high flow oxygen   SUBJECTIVE/OVERNIGHT/INTERVAL HX 10/20/2017  - Say PM dx x 2.5  Years wit IvIG and prednisone as Rx. Reports 3-4L [place] at [facility]. Here now down to 60L HFNC with BiPAP  QHS. Diuresing and on Steroids (ESR > 110).  ECHO 3/26 with PASP 71  VITAL SIGNS: BP 132/81   Pulse 92   Temp (!) 97 F (36.1 C) (Oral)   Resp (!) 26   Wt 84.1 kg (185 lb 6.5 oz)   SpO2 97%   BMI 32.84 kg/m   HEMODYNAMICS:    VENTILATOR SETTINGS: Vent Mode: BIPAP FiO2 (%):  [50 %-90 %] 60 % Set Rate:  [8 bmp] 8 bmp PEEP:  [5 cmH20] 5 cmH20  INTAKE / OUTPUT: I/O last 3 completed shifts: In: 310 [I.V.:10; IV Piggyback:300] Out: 600 [Urine:600]  PHYSICAL EXAMINATION:  General Appearance:    Looks chronic ill. Obese  Head:    Normocephalic, without obvious abnormality, atraumatic  Eyes:    PERRL - yes, conjunctiva/corneas - y3s      Ears:    Normal external ear canals, both ears  Nose:   NG tube - no but has  High flow nasal cannula  Throat:  ETT TUBE - no , OG tube - no  Neck:   Supple,  No enlargement/tenderness/nodules     Lungs:     No distress but has crackles scatteree  Chest wall:    No deformity  Heart:  S1 and S2 normal, no murmur, CVP - no.  Pressors - no  Abdomen:     Soft, no masses, no organomegaly  Genitalia:    Not done  Rectal:   not done  Extremities:   Extremities- intact     Skin:   Intact in exposed areas .      Neurologic:   Sedation - non -> RASS - +1 . Moves all 4s - yes. CAM-ICU - neg . Orientation - z3 +. Watching TV     LABS: PULMONARY Recent Labs  Lab 10/19/17 0050 10/19/17 0834  PHART 7.449 7.508*  PCO2ART 23.0* 24.8*  PO2ART 241.0* 101.0  HCO3 15.9* 19.7*  TCO2 17* 20*  O2SAT 100.0 98.0    CBC Recent Labs  Lab 10/19/17 0308  HGB 12.0  HCT 36.9  WBC 9.2  PLT 497*    COAGULATION No results for input(s): INR in the last 168 hours.  CARDIAC   Recent Labs   Lab 10/19/17 0744 10/19/17 1412 10/19/17 1931  TROPONINI 0.12* 0.06* 0.04*   No results for input(s): PROBNP in the last 168 hours.   CHEMISTRY Recent Labs  Lab 10/19/17 0308 10/19/17 0744 10/19/17 1412 10/20/17 0433  NA 137 137 138 135  K 2.9* 3.3* 4.8 5.3*  CL 102 103 107 105  CO2 18* 19* 18* 19*  GLUCOSE 168* 169* 242* 344*  BUN _0 CREATININE 1.57* 1.50* 1.37* 1.37*  CALCIUM 8.3* 8.0* 8.2* 8.3*  MG  --  1.2*  --   --   PHOS  --  3.7  --   --    Estimated Creatinine Clearance: 45.4 mL/min (A) (by C-G formula based on SCr of 1.37 mg/dL (H)).   LIVER No results for input(s): AST, ALT, ALKPHOS, BILITOT, PROT, ALBUMIN, INR in the last 168 hours.   INFECTIOUS Recent Labs  Lab 10/20/17 0433  PROCALCITON 0.18     ENDOCRINE CBG (last 3)  Recent Labs    10/20/17 0005 10/20/17 0428 10/20/17 0756  GLUCAP 406* 321* 315*         IMAGING x48h  - image(s) personally visualized  -   highlighted in bold Dg Chest Port 1 View  Result Date: 10/19/2017 CLINICAL DATA:  62 year old female with shortness of breath. EXAM: PORTABLE CHEST 1 VIEW COMPARISON:  Chest radiograph dated 10/12/2017 FINDINGS: Left-sided PICC with tip in similar position. There is shallow inspiration with bibasilar atelectatic changes/scarring. There is diffuse chronic interstitial coarsening. No focal consolidation, pleural effusion, or pneumothorax. The cardiac silhouette is within normal limits. Coronary vascular calcification and stent as well as atherosclerotic calcification of the aortic arch. Lower cervical fixation hardware. No acute osseous pathology. IMPRESSION: No active disease. Electronically Signed   By: Anner Crete M.D.   On: 10/19/2017 01:19     DISCUSSION: Acute on chronic hypoxic respiratory failure in the setting of decompensated diastolic heart failure with pulmonary edema superimposed on secondary pulmonary artery hypertension, and underlying presumptive diagnosis  of ILD with  COPD.  I suspect her inability to take her home medications is the greatest contributor to her decompensation.  I am not convinced she is infected, but she did have flu exposure.  We will treat her with pulse steroids, aggressive diuresis, high flow oxygen and nocturnal BiPAP.  I think we need to consider skilled nursing at discharge  ASSESSMENT / PLAN:  Acute on chronic hypoxic respiratory failure.  This seems to be multifactorial, but most likely acute diastolic heart failure  with pulmonary edema superimposed on underlying presumptive diagnosis of IPF, COPD, and resultant secondary PAH.  Suspect that this has been exacerbated by her inability to take her home medications, but also need to consider possible recent viral exposure  10/20/2017  - some improvement but still far away from baseline. Now on 60L [place] and BIPAP qhs. Susepct with ESR > 100 this is ILD flare esp with RVP negative  Plan Move back to [facility] Will alternate heated high flow oxygen with mandatory at bedtime BiPAP Continue aggressive IV diuresis (but do not increaes given PASP 71); might need right heart cath - calling cards Increase steroids Check autoimmuen and vasculitis panel for completion Scheduled bronchodilators Get HRCT    History of Diastolic heart failure as well as pulmonary artery hypertension (secondary).  Given physical exam and history also would be concerned about decompensated cor pulmonale History of coronary artery disease with prior bare-metal stents to RCA and LAD  - ECHO with PASP 71  Plan Cards master called for RHC - how much to diurese? And role for advanced PAH Rx given autoimmune dz Restart statin Continue aspirin Continue beta-blockade IV diuresis Holding Norvasc  Acute kidney injury.  Baseline creatinine is 1.  GFR is 35.  Presents with creatinine of 1.57.   Plan Renal dose medications Diuresis as tolerated Follow-up a.m. Chemistry. Replaced, recheck.  Mild anion gap  metabolic acidosis.  Suspect this is due to work of breathing and not sepsis.  Glucose is not severely elevated so doubt DKA. Plan Repeat blood chemistry Supportive care treating respiratory failure  Nausea and vomiting.?  Etiology.  Does have significant electrolyte derangements, However also has a history of pancreatitis in the past.   - 10/20/2017 - no nausea Plan Check lipase Continue PRN antiemetics Ensure electrolytes replaced  Diabetes Plan Sliding scale insulin  History of polymyositis Plan Continuing steroids  DVT prophylaxis: lmwh SUP: na  Diet: NPO except meds  Activity: BR Disposition : ICU  -> move to SDU  FAMILY  - Updates: patient updated  DISPO Move to sDU. BAck to [facility]. [place] call Dia [place]. PCCM will follow. ILD clinic appt as below  Future Appointments  Date Time Provider [place]  10/29/2017  9:15 AM Brand Males, MD [facility] None    Dr. Brand Males, M.D., [facility].C.P Pulmonary and Critical Care Medicine Staff Physician, [place] Director - Interstitial Lung Disease  Program  [facility] [place] [place], [place], [address]  Pager: [phone], If no answer or between  15:00h - 7:00h: call [phone] Telephone: [phone]

## 2017-10-21 ENCOUNTER — Encounter (HOSPITAL_COMMUNITY): Payer: Self-pay | Admitting: Internal Medicine

## 2017-10-21 ENCOUNTER — Inpatient Hospital Stay (HOSPITAL_COMMUNITY): Payer: Medicare HMO

## 2017-10-21 ENCOUNTER — Encounter (HOSPITAL_COMMUNITY)
Admission: EM | Disposition: A | Discharge status: Home / Self Care | Payer: Self-pay | Source: Home / Self Care | Attending: Pulmonary Disease

## 2017-10-21 DIAGNOSIS — I2721 Secondary pulmonary arterial hypertension: Secondary | ICD-10-CM

## 2017-10-21 DIAGNOSIS — J849 Interstitial pulmonary disease, unspecified: Secondary | ICD-10-CM

## 2017-10-21 HISTORY — PX: RIGHT HEART CATH: CATH118263

## 2017-10-21 LAB — GLUCOSE, CAPILLARY
GLUCOSE-CAPILLARY: 284 mg/dL — AB (ref 65–99)
Glucose-Capillary: 306 mg/dL — ABNORMAL HIGH (ref 65–99)
Glucose-Capillary: 327 mg/dL — ABNORMAL HIGH (ref 65–99)
Glucose-Capillary: 253 mg/dL — ABNORMAL HIGH (ref 65–99)
Glucose-Capillary: 350 mg/dL — ABNORMAL HIGH (ref 65–99)

## 2017-10-21 LAB — PHOSPHORUS: Phosphorus: 2.7 mg/dL (ref 2.5–4.6)

## 2017-10-21 LAB — POCT I-STAT 3, VENOUS BLOOD GAS (G3P V)
ACID-BASE DEFICIT: 3 mmol/L — AB (ref 0.0–2.0)
ACID-BASE DEFICIT: 4 mmol/L — AB (ref 0.0–2.0)
BICARBONATE: 19.9 mmol/L — AB (ref 20.0–28.0)
BICARBONATE: 20.8 mmol/L (ref 20.0–28.0)
O2 SAT: 58 %
O2 SAT: 59 %
PO2 VEN: 30 mmHg — AB (ref 32.0–45.0)
TCO2: 21 mmol/L — AB (ref 22–32)
TCO2: 22 mmol/L (ref 22–32)
pCO2, Ven: 31.6 mmHg — ABNORMAL LOW (ref 44.0–60.0)
pCO2, Ven: 33.3 mmHg — ABNORMAL LOW (ref 44.0–60.0)
pH, Ven: 7.404 (ref 7.250–7.430)
pH, Ven: 7.406 (ref 7.250–7.430)
pO2, Ven: 30 mmHg — CL (ref 32.0–45.0)

## 2017-10-21 LAB — MPO/PR-3 (ANCA) ANTIBODIES
ANCA Proteinase 3: <3.5 U/mL (ref 0.0–3.5)
Myeloperoxidase Abs: <9 U/mL (ref 0.0–9.0)

## 2017-10-21 LAB — BASIC METABOLIC PANEL
Anion gap: 9 (ref 5–15)
BUN: 32 mg/dL — AB (ref 6–20)
CO2: 20 mmol/L — ABNORMAL LOW (ref 22–32)
Calcium: 8.2 mg/dL — ABNORMAL LOW (ref 8.9–10.3)
Chloride: 106 mmol/L (ref 101–111)
Creatinine, Ser: 1.29 mg/dL — ABNORMAL HIGH (ref 0.44–1.00)
GFR, EST AFRICAN AMERICAN: 51 mL/min — AB (ref >60)
GFR, EST NON AFRICAN AMERICAN: 44 mL/min — AB (ref >60)
Glucose, Bld: 335 mg/dL — ABNORMAL HIGH (ref 65–99)
POTASSIUM: 5.1 mmol/L (ref 3.5–5.1)
SODIUM: 135 mmol/L (ref 135–145)

## 2017-10-21 LAB — HEPATIC FUNCTION PANEL
ALK PHOS: 78 U/L (ref 38–126)
ALT: 34 U/L (ref 14–54)
AST: 29 U/L (ref 15–41)
Albumin: 2.7 g/dL — ABNORMAL LOW (ref 3.5–5.0)
BILIRUBIN TOTAL: 0.5 mg/dL (ref 0.3–1.2)
Bilirubin, Direct: <0.1 mg/dL — ABNORMAL LOW (ref 0.1–0.5)
TOTAL PROTEIN: 6.4 g/dL — AB (ref 6.5–8.1)

## 2017-10-21 LAB — CYCLIC CITRUL PEPTIDE ANTIBODY, IGG/IGA: CCP Antibodies IgG/IgA: 7 units (ref 0–19)

## 2017-10-21 LAB — MAGNESIUM: MAGNESIUM: 2 mg/dL (ref 1.7–2.4)

## 2017-10-21 LAB — GLOMERULAR BASEMENT MEMBRANE ANTIBODIES: GBM Ab: 3 units (ref 0–20)

## 2017-10-21 LAB — ANTINUCLEAR ANTIBODIES, IFA: ANTINUCLEAR ANTIBODIES, IFA: NEGATIVE

## 2017-10-21 LAB — ANTI-SCLERODERMA ANTIBODY

## 2017-10-21 LAB — RHEUMATOID FACTOR: Rhuematoid fact SerPl-aCnc: 11.4 IU/mL (ref 0.0–13.9)

## 2017-10-21 LAB — ANTI-DNA ANTIBODY, DOUBLE-STRANDED: ds DNA Ab: <1 IU/mL (ref 0–9)

## 2017-10-21 LAB — PROCALCITONIN: PROCALCITONIN: 0.14 ng/mL

## 2017-10-21 LAB — ANTI-JO 1 ANTIBODY, IGG: Anti JO-1: >8 AI — ABNORMAL HIGH (ref 0.0–0.9)

## 2017-10-21 SURGERY — RIGHT HEART CATH
Anesthesia: LOCAL

## 2017-10-21 MED ORDER — FENTANYL CITRATE (PF) 100 MCG/2ML IJ SOLN
INTRAMUSCULAR | Status: DC | PRN
  Administered 2017-10-21: 12.5 ug via INTRAVENOUS

## 2017-10-21 MED ORDER — MIDAZOLAM HCL 2 MG/2ML IJ SOLN
INTRAMUSCULAR | Status: AC
  Filled 2017-10-21: qty 2

## 2017-10-21 MED ORDER — ACETAMINOPHEN 325 MG PO TABS: 650.0000 mg | ORAL_TABLET | ORAL | Status: DC | PRN

## 2017-10-21 MED ORDER — INSULIN GLARGINE 100 UNIT/ML ~~LOC~~ SOLN
45.0000 [IU] | Freq: Every day | SUBCUTANEOUS | Status: DC
  Administered 2017-10-22: 45 [IU] via SUBCUTANEOUS
  Filled 2017-10-21: qty 0.45

## 2017-10-21 MED ORDER — METHYLPREDNISOLONE SODIUM SUCC 125 MG IJ SOLR
80.0000 mg | Freq: Three times a day (TID) | INTRAMUSCULAR | Status: DC
  Administered 2017-10-21 – 2017-10-22 (×3): 80 mg via INTRAVENOUS
  Filled 2017-10-21 (×2): qty 1.28

## 2017-10-21 MED ORDER — HEPARIN (PORCINE) IN NACL 2-0.9 UNIT/ML-% IJ SOLN
INTRAMUSCULAR | Status: AC
  Filled 2017-10-21: qty 500

## 2017-10-21 MED ORDER — ENOXAPARIN SODIUM 40 MG/0.4ML ~~LOC~~ SOLN: 40.0000 mg | SUBCUTANEOUS | Status: DC

## 2017-10-21 MED ORDER — LIDOCAINE HCL (PF) 1 % IJ SOLN
INTRAMUSCULAR | Status: DC | PRN
  Administered 2017-10-21: 2 mL

## 2017-10-21 MED ORDER — SODIUM CHLORIDE 0.9 % IV SOLN: 250.0000 mL | INTRAVENOUS | Status: DC | PRN

## 2017-10-21 MED ORDER — SODIUM CHLORIDE 0.9% FLUSH: 3.0000 mL | INTRAVENOUS | Status: DC | PRN

## 2017-10-21 MED ORDER — LIDOCAINE HCL (PF) 1 % IJ SOLN
INTRAMUSCULAR | Status: AC
  Filled 2017-10-21: qty 30

## 2017-10-21 MED ORDER — SODIUM CHLORIDE 0.9% FLUSH
3.0000 mL | Freq: Two times a day (BID) | INTRAVENOUS | Status: DC
  Administered 2017-10-21 – 2017-10-22 (×3): 3 mL via INTRAVENOUS

## 2017-10-21 MED ORDER — HEPARIN (PORCINE) IN NACL 2-0.9 UNIT/ML-% IJ SOLN
INTRAMUSCULAR | Status: AC | PRN
  Administered 2017-10-21: 500 mL

## 2017-10-21 MED ORDER — MIDAZOLAM HCL 2 MG/2ML IJ SOLN
INTRAMUSCULAR | Status: DC | PRN
  Administered 2017-10-21: 0.5 mg via INTRAVENOUS

## 2017-10-21 MED ORDER — INSULIN ASPART 100 UNIT/ML ~~LOC~~ SOLN
0.0000 [IU] | SUBCUTANEOUS | Status: DC
  Administered 2017-10-21: 15 [IU] via SUBCUTANEOUS
  Administered 2017-10-21 – 2017-10-22 (×2): 20 [IU] via SUBCUTANEOUS
  Administered 2017-10-22 (×2): 15 [IU] via SUBCUTANEOUS

## 2017-10-21 MED ORDER — FUROSEMIDE 10 MG/ML IJ SOLN
80.0000 mg | Freq: Every day | INTRAMUSCULAR | Status: DC
  Administered 2017-10-22: 80 mg via INTRAVENOUS
  Filled 2017-10-21 (×2): qty 8

## 2017-10-21 MED ORDER — ONDANSETRON HCL 4 MG/2ML IJ SOLN: 4.0000 mg | Freq: Four times a day (QID) | INTRAMUSCULAR | Status: DC | PRN

## 2017-10-21 MED ORDER — FENTANYL CITRATE (PF) 100 MCG/2ML IJ SOLN
INTRAMUSCULAR | Status: AC
  Filled 2017-10-21: qty 2

## 2017-10-21 SURGICAL SUPPLY — 9 items
CATH SWAN GANZ 7F STRAIGHT (CATHETERS) ×2 IMPLANT
COVER PRB 48X5XTLSCP FOLD TPE (BAG) ×1 IMPLANT
COVER PROBE 5X48 (BAG) ×1
PACK CARDIAC CATHETERIZATION (CUSTOM PROCEDURE TRAY) ×2 IMPLANT
SHEATH AVANTI 11CM 7FR (SHEATH) ×2 IMPLANT
SLEEVE REPOSITIONING LENGTH 30 (MISCELLANEOUS) ×2 IMPLANT
TRANSDUCER W/STOPCOCK (MISCELLANEOUS) ×2 IMPLANT
TUBING ART PRESS 72  MALE/FEM (TUBING) ×1
TUBING ART PRESS 72 MALE/FEM (TUBING) ×1 IMPLANT

## 2017-10-21 NOTE — Progress Notes (Addendum)
Pt transported to cath lab with RN and tech - daughter here went with patient to waiting area for cath lab . Pt was on HFNC consulted with respiratory therapy about best 02 for transport - pt placed on 100 % non rebreather for transport and procedure CBG done just prior to leaving unit was 253

## 2017-10-21 NOTE — CV Procedure (Signed)
Right heart cath  Findings:   RA = 9 RV = 64/11 PA = 63/26 (41) PCW = 4 Fick cardiac output/index = 4.6/2.5 Thermo CO/CI = 3.8/2.0 PVR = 9.8 WU FA sat = 92% PA sat = 58%, 59%  Assessment:  1. Moderate PAH with low left-sided filling pressures  Plan/Discussion:  Volume status looks good. She has moderate PAH. Will need to review with Pulmonary team if she is candidate for selective pulmonary artery vasodilators or if her parenchymal lung disease prohibits this.  Will need PFTs with [facility]CO to further evaluate.

## 2017-10-21 NOTE — Progress Notes (Signed)
Pt returned from cath lab awake , alert Rt neck dressing where they pulled sheath clean, dry and intact.

## 2017-10-21 NOTE — Progress Notes (Signed)
Talked to pt about why she was not comfortable wearing Bipap more than 15" last night - DR Marchelle Gearing asked her on 3/27 to try to wear it overnight - Pt stated that the machine kept ringing going off Pt on HFNC all night and looks comfortable on it now.   Pt going for heart cath today. Given 1000 PO meds early with a small sip of water - NPO except for meds with sips.

## 2017-10-21 NOTE — Interval H&P Note (Signed)
History and Physical Interval Note:  10/21/2017 11:54 AM  Elizabeth Montes  has presented today for surgery, with the diagnosis of hp  The various methods of treatment have been discussed with the patient and family. After consideration of risks, benefits and other options for treatment, the patient has consented to  Procedure(s): RIGHT HEART CATH (N/A) as a surgical intervention .  The patient's history has been reviewed, patient examined, no change in status, stable for surgery.  I have reviewed the patient's chart and labs.  Questions were answered to the patient's satisfaction.     Ying Blankenhorn

## 2017-10-21 NOTE — Progress Notes (Signed)
P CCM and cardiology evaluated patient today.  Therefore no need for [facility] to see patient today.  Did introduce myself to patient and informed her that [facility] would be the primary service taking care of her health care needs starting on 3/29.  Will keep patient on team 1 list and see him in the A.m. No charge     Drema Dallas, MD [facility] Pager [phone]   If 7PM-7AM, please contact night-coverage [email or web address] Password [email or web address] 10/20/2017,7:40 AM

## 2017-10-21 NOTE — Progress Notes (Addendum)
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 She moved to [place] about 7 mo. Ago and this presentation marks > 10 admissions for acute on chronic respiratory failure. She was just discharged from [facility] on 3/12 following an admit for what was felt to be acute on chronic resp failure d/t AECOPD +/- IPF flare w/ volume overload. She was dc'd on pred taper, BDs, and lasix.  She says she was doing well for about the first week after discharge, but then began to develop cough which became more progressive, worsening shortness of breath, intermittent chest discomfort, and nausea which prevented her from taking her pills.  She stated she continued to feel weaker, she was unable to eat, and also not long after her discharge back to home her daughter and granddaughter both had "the flu".  She presents to the emergency room on 3/26. With persistent cough, productive of thick clear sputum.  Resting shortness of breath, and low pulse oximetry, saturations 83% on nasal cannula.  She denied fever, denied nasal congestion, denied headache.  Since hospital presentation she is been treated with BiPAP, IV Lasix, and IV Solu-Medrol.  In spite of these therapies she is spent approximately 8 hours in the emergency room without significant improvement and therefore critical care was asked to evaluate.   LINES/TUBES: Left upper extremity PICC line, this is been in for approximately 6  months  EVENTS 3/26 - Feels about the same after being transitioned to high flow oxygen  3/27 -   - Say PM dx x 2.5  Years wit IvIG and prednisone as Rx. Reports 3-4L [place] at hiome. Here now down to 60L HFNC with BiPAP  QHS. Diuresing and on Steroids (ESR > 110).  ECHO 3/26 with PASP 71  3/28 - Right heart cath -    RA = 9 RV = 64/11 PA = 63/26 (41) PCW = 4 Fick cardiac output/index = 4.6/2.5 Thermo CO/CI = 3.8/2.0 PVR = 9.8 WU FA sat = 92% PA sat = 58%, 59%  1. Moderate PAH with low left-sided filling pressures   SUBJECTIVE/OVERNIGHT/INTERVAL HX 10/21/2017 - appears to have [place] with normal PCWP (this is likely WHO GRoup 1 with connective tissue diesase +/- WHO group 3) . 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 Per pharmacy - patient was getting IvIG in [place] and was actually oin immuran in dec 2018  Down to 6 L [place]. High flow  Results for Elizabeth, Montes (MRN [id number]) as of 10/21/2017 13:15  Ref. Range 10/20/2017 14:54  ANA Ab, IFA Unknown Negative  GBM Ab Latest Ref Range: 0 - 20 units 3  RA Latex Turbid. Latest Ref Range: 0.0 - 13.9 IU/mL 11.4   VITAL SIGNS: BP 131/82   Pulse 95   Temp (!) 97.4 F (36.3 C) (Oral)  Resp 18   Wt 85.5 kg (188 lb 7.9 oz)   SpO2 90%   BMI 33.39 kg/m   HEMODYNAMICS:    VENTILATOR SETTINGS: Vent Mode: BIPAP FiO2 (%):  [40 %-50 %] 50 % Set Rate:  [8 bmp-80 bmp] 80 bmp PEEP:  [5 cmH20] 5 cmH20  INTAKE / OUTPUT: I/O last 3 completed shifts: In: 15 [P.O.:660; I.V.:10] Out: 2125 [Urine:2125]  PHYSICAL EXAMINATION:   General Appearance:    Looks cbetter. Obese +. Texting on phone  Head:    Normocephalic, without obvious abnormality, atraumatic  Eyes:    PERRL - yes, conjunctiva/corneas - clear      Ears:    Normal external ear canals, both ears  Nose:   NG tube - no but has [place]   Throat:  ETT TUBE - n , OG tube - no  Neck:   Supple,  No enlargement/tenderness/nodules     Lungs:       Chest wall:    No deformity  Heart:    S1 and S2 normal, no murmur, CVP - no.  Pressors - no  Abdomen:     Soft, no masses, no organomegaly  Genitalia:    Not done  Rectal:   not done  Extremities:   Extremities- edema     Skin:   Intact in exposed areas .      Neurologic:   Sedation - none -> RASS - 0 . Moves all 4s - yes. CAM-ICU - neg . Orientation - x3+      LABS: PULMONARY Recent Labs  Lab 10/19/17 0050 10/19/17 0834  PHART 7.449 7.508*  PCO2ART 23.0* 24.8*  PO2ART 241.0* 101.0  HCO3 15.9* 19.7*  TCO2 17* 20*  O2SAT 100.0 98.0    CBC Recent Labs  Lab 10/19/17 0308  HGB 12.0  HCT 36.9  WBC 9.2  PLT 497*    COAGULATION Recent Labs  Lab 10/20/17 1454  INR 1.12    CARDIAC   Recent Labs  Lab 10/19/17 0744 10/19/17 1412 10/19/17 1931  TROPONINI 0.12* 0.06* 0.04*   No results for input(s): PROBNP in the last 168 hours.   CHEMISTRY Recent Labs  Lab 10/19/17 0308 10/19/17 0744 10/19/17 1412 10/20/17 0433 10/21/17 0500  NA 137 137 138 135 135  K 2.9* 3.3* 4.8 5.3* 5.1  CL 102 103 107 105 106  CO2 18* 19* 18* 19* 20*  GLUCOSE 168* 169* 242* 344* 335*  BUN _0 32*  CREATININE 1.57* 1.50* 1.37* 1.37* 1.29*  CALCIUM 8.3* 8.0* 8.2* 8.3* 8.2*  MG  --  1.2*  --   --  2.0  PHOS  --  3.7  --   --  2.7   Estimated Creatinine Clearance: 48.6 mL/min (A) (by C-G formula based on SCr of 1.29 mg/dL (H)).   LIVER Recent Labs  Lab 10/20/17 1454 10/21/17 0500  AST  --  29  ALT  --  34  ALKPHOS  --  78  BILITOT  --  0.5  PROT  --  6.4*  ALBUMIN  --  2.7*  INR 1.12  --      INFECTIOUS Recent Labs  Lab 10/20/17 0433 10/20/17 1454 10/21/17 0500  PROCALCITON 0.18 0.20 0.14     ENDOCRINE CBG (last 3)  Recent Labs    10/21/17 0421 10/21/17 0810 10/21/17 1110  GLUCAP 306* 327* 253*         IMAGING x48h  -  image(s) personally visualized  -  highlighted in bold Ct Chest High Resolution  Result Date: 10/21/2017 CLINICAL DATA:  Reported history of interstitial lung disease. Inpatient. EXAM: CT CHEST WITHOUT CONTRAST TECHNIQUE: Multidetector CT imaging of the chest was performed following the standard protocol without intravenous contrast. High resolution imaging of the lungs, as well as inspiratory and expiratory imaging, was performed. COMPARISON:  09/30/2017 chest CT. Chest radiograph from one day prior. FINDINGS: Cardiovascular: Normal heart size. No significant pericardial fluid/thickening. Left anterior descending, left circumflex and right coronary atherosclerosis. Left PICC terminates in the upper third of the superior vena cava. Atherosclerotic nonaneurysmal thoracic aorta. Borderline prominent main pulmonary artery (3.4 cm diameter), stable. Mediastinum/Nodes: No discrete thyroid nodules. Unremarkable esophagus. No axillary adenopathy. Stable mild right paratracheal adenopathy up to 1.1 cm (series 5/image 38). Stable mild bilateral hilar adenopathy, poorly delineated on these noncontrast images. Lungs/Pleura: No pneumothorax. No pleural effusion. Moderate centrilobular emphysema with mild diffuse bronchial wall thickening. No acute consolidative airspace disease, lung masses or significant pulmonary nodules. There is patchy subpleural and peribronchovascular reticulation and ground-glass attenuation throughout both lungs with associated mild traction bronchiectasis and architectural distortion. There is a strong basilar gradient to these findings. No frank honeycombing. No definite disease progression compared to the 12/19/2016 chest CT. No significant lobular air trapping on the expiration sequence. Upper abdomen: Partially visualized exophytic 2.9 cm simple lateral upper right renal cyst. Musculoskeletal: No aggressive appearing focal osseous lesions. Partially visualized surgical hardware from ACDF in the  lower cervical spine. Mild thoracic spondylosis. IMPRESSION: 1. Spectrum of findings compatible with a basilar predominant fibrotic interstitial lung disease without frank honeycombing. No definite disease progression compared to 12/19/2016 chest CT. 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 2. Three-vessel coronary atherosclerosis. 3. Stable mild mediastinal and bilateral hilar adenopathy, nonspecific, probably reactive. Aortic Atherosclerosis (ICD10-I70.0) and Emphysema (ICD10-J43.9). Electronically Signed   By: Ilona Sorrel M.D.   On: 10/21/2017 09:30   Dg Chest Port 1 View  Result Date: 10/21/2017 CLINICAL DATA:  Interstitial lung disease EXAM: PORTABLE CHEST 1 VIEW COMPARISON:  10/19/2017 FINDINGS: Central venous catheter tip in the SVC unchanged.  No pneumothorax. Streaky linear markings in both lung bases appear chronic and unchanged from prior studies. No acute infiltrate effusion or edema. No interval change IMPRESSION: Bibasilar fibrosis stable.  No acute abnormality. Electronically Signed   By: Franchot Gallo M.D.   On: 10/21/2017 07:35     DISCUSSION: Acute on chronic hypoxic respiratory failure in the setting of decompensated diastolic heart failure with pulmonary edema superimposed on secondary pulmonary artery hypertension, and underlying presumptive diagnosis of ILD with  COPD.  I suspect her inability to take her home medications is the greatest contributor to her decompensation.  I am not convinced she is infected, but she did have flu exposure.  We will treat her with pulse steroids, aggressive diuresis, high flow oxygen and nocturnal BiPAP.  I think we need to consider skilled nursing at discharge  ASSESSMENT / PLAN:  Acute on chronic hypoxic respiratory  failure.  This seems to be multifactorial, but most likely acute diastolic heart failure with pulmonary edema superimposed on underlying presumptive diagnosis of IPF, COPD, and resultant secondary PAH.  Suspect that this has been exacerbated by her inability to take her home medications, but also need to consider possible recent viral exposure  10/21/2017  -  Now down  to 6L . She is dry on right heart cath. 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  Plan - continue steroids IV - 93m solumedrol Q6h -> chagne to Q8h and over next few days taper to prednisone 642mday/405maily (opd was 34m23mily) - furrhter titration in ILD clinic - continue o2  -- get full PFT - Pulm Htn consult - Dr BensJeffie Pollockled - likely will need to address all this as opd  - ILD clinic - fillowup needed- steroids needed + opd cellcept - PRognosis: poor given her advance o2 need unless PAH drugs improve her - can move to floor  PCCM will follow as needed  Patient updated  > 50% of this > 25 min visit spent in face to face counseling or coordination of care     Future Appointments  Date Time Provider DepaRyan5/2019  9:15 AM RamaBrand Males [facility] None  11/08/2017  9:30 AM WillKathrynn Ducking [facility] None     Dr. MuraBrand MalesD., F.C.C.P Pulmonary and Critical Care Medicine Staff Physician, [facility]  Program  Pulmonary [place], [id number]: [phone] no answer or between  15:00h - 7:00h: call [phone] Telephone: [phone]

## 2017-10-22 DIAGNOSIS — M3320 Polymyositis, organ involvement unspecified: Secondary | ICD-10-CM

## 2017-10-22 DIAGNOSIS — J8410 Pulmonary fibrosis, unspecified: Secondary | ICD-10-CM

## 2017-10-22 DIAGNOSIS — R768 Other specified abnormal immunological findings in serum: Secondary | ICD-10-CM

## 2017-10-22 DIAGNOSIS — E876 Hypokalemia: Secondary | ICD-10-CM

## 2017-10-22 DIAGNOSIS — I509 Heart failure, unspecified: Secondary | ICD-10-CM

## 2017-10-22 LAB — GLUCOSE, CAPILLARY
GLUCOSE-CAPILLARY: 269 mg/dL — AB (ref 65–99)
GLUCOSE-CAPILLARY: 454 mg/dL — AB (ref 65–99)
GLUCOSE-CAPILLARY: 417 mg/dL — AB (ref 65–99)
GLUCOSE-CAPILLARY: 343 mg/dL — AB (ref 65–99)
Glucose-Capillary: 300 mg/dL — ABNORMAL HIGH (ref 65–99)
Glucose-Capillary: 346 mg/dL — ABNORMAL HIGH (ref 65–99)

## 2017-10-22 LAB — BASIC METABOLIC PANEL
ANION GAP: 9 (ref 5–15)
BUN: 39 mg/dL — ABNORMAL HIGH (ref 6–20)
CALCIUM: 8.1 mg/dL — AB (ref 8.9–10.3)
CO2: 22 mmol/L (ref 22–32)
CREATININE: 1.31 mg/dL — AB (ref 0.44–1.00)
Chloride: 103 mmol/L (ref 101–111)
GFR calc Af Amer: 51 mL/min — ABNORMAL LOW (ref >60)
GFR, EST NON AFRICAN AMERICAN: 44 mL/min — AB (ref >60)
Glucose, Bld: 324 mg/dL — ABNORMAL HIGH (ref 65–99)
Potassium: 4.3 mmol/L (ref 3.5–5.1)
Sodium: 134 mmol/L — ABNORMAL LOW (ref 135–145)

## 2017-10-22 LAB — SJOGRENS SYNDROME-B EXTRACTABLE NUCLEAR ANTIBODY

## 2017-10-22 LAB — PHOSPHORUS: PHOSPHORUS: 2.7 mg/dL (ref 2.5–4.6)

## 2017-10-22 LAB — SJOGRENS SYNDROME-A EXTRACTABLE NUCLEAR ANTIBODY

## 2017-10-22 LAB — MAGNESIUM: Magnesium: 2 mg/dL (ref 1.7–2.4)

## 2017-10-22 MED ORDER — INSULIN ASPART 100 UNIT/ML ~~LOC~~ SOLN
4.0000 [IU] | Freq: Three times a day (TID) | SUBCUTANEOUS | Status: DC
  Administered 2017-10-22 – 2017-10-23 (×4): 4 [IU] via SUBCUTANEOUS

## 2017-10-22 MED ORDER — INSULIN ASPART 100 UNIT/ML ~~LOC~~ SOLN
0.0000 [IU] | Freq: Three times a day (TID) | SUBCUTANEOUS | Status: DC
  Administered 2017-10-22 – 2017-10-23 (×4): 20 [IU] via SUBCUTANEOUS

## 2017-10-22 MED ORDER — INSULIN GLARGINE 100 UNIT/ML ~~LOC~~ SOLN
20.0000 [IU] | Freq: Once | SUBCUTANEOUS | Status: AC
  Administered 2017-10-22: 20 [IU] via SUBCUTANEOUS
  Filled 2017-10-22: qty 0.2

## 2017-10-22 MED ORDER — INSULIN GLARGINE 100 UNIT/ML ~~LOC~~ SOLN: 30.0000 [IU] | Freq: Two times a day (BID) | SUBCUTANEOUS | Status: DC

## 2017-10-22 MED ORDER — MYCOPHENOLATE MOFETIL 250 MG PO CAPS
500.0000 mg | ORAL_CAPSULE | Freq: Two times a day (BID) | ORAL | Status: DC
  Filled 2017-10-22: qty 2

## 2017-10-22 MED ORDER — FUROSEMIDE 80 MG PO TABS
80.0000 mg | ORAL_TABLET | Freq: Every day | ORAL | Status: DC
  Administered 2017-10-23: 80 mg via ORAL
  Filled 2017-10-22: qty 1

## 2017-10-22 MED ORDER — MYCOPHENOLATE SODIUM 180 MG PO TBEC
360.0000 mg | DELAYED_RELEASE_TABLET | Freq: Two times a day (BID) | ORAL | Status: DC
  Administered 2017-10-22 – 2017-10-23 (×3): 360 mg via ORAL
  Filled 2017-10-22 (×5): qty 2

## 2017-10-22 MED ORDER — SULFAMETHOXAZOLE-TRIMETHOPRIM 800-160 MG PO TABS
1.0000 | ORAL_TABLET | ORAL | Status: DC
  Administered 2017-10-22: 1 via ORAL
  Filled 2017-10-22: qty 1

## 2017-10-22 MED ORDER — METHYLPREDNISOLONE SODIUM SUCC 125 MG IJ SOLR
80.0000 mg | Freq: Two times a day (BID) | INTRAMUSCULAR | Status: DC
  Administered 2017-10-22 – 2017-10-23 (×2): 80 mg via INTRAVENOUS
  Filled 2017-10-22: qty 2
  Filled 2017-10-22: qty 1.28
  Filled 2017-10-22: qty 2

## 2017-10-22 MED ORDER — INSULIN GLARGINE 100 UNIT/ML ~~LOC~~ SOLN
30.0000 [IU] | Freq: Two times a day (BID) | SUBCUTANEOUS | Status: DC
  Administered 2017-10-22 – 2017-10-23 (×3): 30 [IU] via SUBCUTANEOUS
  Filled 2017-10-22 (×5): qty 0.3

## 2017-10-22 NOTE — Progress Notes (Signed)
Patient's 5pm CBG was 417. Paged Dr David Stall and received orders for 20 units of [place] in addition to sliding scale coverage.

## 2017-10-22 NOTE — Care Management Note (Addendum)
Case Management Note  Patient Details  Name: BURNIS BRESSETTE MRN: [id number] Date of Birth: Jun 18, 1956  Subjective/Objective:           READMISSION       62 y.o. female with a PMHx of pulmonary HTN; pulmonary fibrosis; polymyositis; HTN; HLD; DM; COPD; CAD with stent; and CHF with normal who presents with 1 week of SOB.  From home with daughter.  Action/Plan: Admit status INPATIENT (HYPOXIC RESP FAILURE); anticipate discharge HOME WITH HOME HEALTH.   Expected Discharge Date:                  Expected Discharge Plan:  Home w Home Health Services  In-House Referral:     Discharge planning Services  CM Consult  Post Acute Care Choice:    Choice offered to:     DME Arranged:    DME Agency:     HH Arranged:    HH Agency:  [facility]  Status of Service:  In process, will continue to follow  If discussed at Long Length of Stay Meetings, dates discussed:    Additional Comments: CM spoke with pt regarding missing medications and continued absence of PCP.  Pt states that during her last hospitalization - her meds were displaced and she had to wait until she was able to get refills.  Pt informed CM that she still has not located PCP - pt in agreement for CM to set up appt at [facility].  CM arranged for [facility] to see pt on 4/18 at 9:20am.  Frances Furbish continues to follow pt.  Pt is on home oxygen supplied by [employer] 2 liters baseline - pt confirmed that family can bring portable tank to hospital for transport home  Pt was enrolled in [place] with [facility] on last admission. Cherylann Parr, RN 10/22/2017, 2:55 PM

## 2017-10-22 NOTE — Progress Notes (Signed)
[facility] PROGRESS NOTE    Progress Note  Elizabeth Montes  LYY:[id number] DOB: 06-18-1956 DOA: 10/19/2017 PCP: Patient, No Pcp Per     Brief Narrative:   Elizabeth Montes is an 62 y.o. 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  1 week after discharge started developing cough more and more short of breath with intermittent chest discomfort she also relates she has now been taking her medication for 1 week.  Assessment/Plan:   Acute on chronic respiratory failure with hypoxia (HCC) 3 multifactorial in the setting of acute diastolic heart failure with pulmonary edema interstitial lung disease and pulmonary hypertension: Probably in the setting of noncompliant with her medication. Now on nasal cannula she is dry on right heart cath.  Started on IV steroids, she will have to be continued to be tapered down slowly as per CCM recommendations. chagne to Q8h and over next few days taper to prednisone 60mg  day/40mg  daily (opd was 20mg  daily) - furrhter titration in [facility] clinic. Full PFTs as an outpatient. Right Cardiac catheter after IV diuresis showed no signs of volume overload.  Acute kidney injury: With a baseline creatinine of 1.0. Ranging around 1.2-1.3.  Acute hypokalemia: Repleted orally.  Diabetes mellitus type 2 without complications: Continue sliding scale insulin A1c 8.5. Increase her long-acting insulin to 30 units twice a day start her on sliding scale insulin resistant her blood sugar was 400 this morning.  Coronary artery disease: Continue  beta-blockers, resume aspirin continue fenofibrate.  Essential hypertension: Will titrate all antihypertensive medications slowly.   DVT prophylaxis: lovenox Family Communication:none Disposition Plan/Barrier to D/C: home in 3 days Code Status:     Code Status Orders  (From admission, onward)        Start     Ordered   10/19/17 0752  Full code  Continuous     10/19/17 0753    Code Status History    Date Active Date Inactive Code Status Order ID Comments User Context   09/30/2017 0831 10/05/2017 1525 Full Code [id number]  Lahoma Crocker, MD ED   09/13/2017 0624 09/17/2017 1840 Full Code [id number]  Eduard Clos, MD ED   08/11/2017 1002 08/12/2017 1621 Full Code [id number]  Shon Hale, MD ED   08/01/2017 1259 08/03/2017 1921 Full Code [id number]  Pieter Partridge, MD ED   07/16/2017 0222 07/18/2017 1907 Full Code [id number]  Opyd, Lavone Neri, MD ED        IV Access:    Peripheral IV   Procedures and diagnostic studies:   Ct Chest High Resolution  Result Date: 10/21/2017 CLINICAL DATA:  Reported history of interstitial lung disease. Inpatient. EXAM: CT CHEST WITHOUT CONTRAST TECHNIQUE: Multidetector CT imaging of the chest was performed following the standard protocol without intravenous contrast. High resolution imaging of the lungs, as well as inspiratory and expiratory imaging, was performed. COMPARISON:  09/30/2017 chest CT. Chest radiograph from one day prior. FINDINGS: Cardiovascular: Normal heart size. No significant pericardial fluid/thickening. Left anterior descending, left circumflex and right coronary atherosclerosis. Left PICC terminates in the upper third of the superior vena cava. Atherosclerotic nonaneurysmal thoracic aorta. Borderline  prominent main pulmonary artery (3.4 cm diameter), stable. Mediastinum/Nodes: No discrete thyroid nodules. Unremarkable esophagus. No axillary adenopathy. Stable mild right paratracheal adenopathy up to 1.1 cm (series  5/image 38). Stable mild bilateral hilar adenopathy, poorly delineated on these noncontrast images. Lungs/Pleura: No pneumothorax. No pleural effusion. Moderate centrilobular emphysema with mild diffuse bronchial wall thickening. No acute consolidative airspace disease, lung masses or significant pulmonary nodules. There is patchy subpleural and peribronchovascular reticulation and ground-glass attenuation throughout both lungs with associated mild traction bronchiectasis and architectural distortion. There is a strong basilar gradient to these findings. No frank honeycombing. No definite disease progression compared to the 12/19/2016 chest CT. No significant lobular air trapping on the expiration sequence. Upper abdomen: Partially visualized exophytic 2.9 cm simple lateral upper right renal cyst. Musculoskeletal: No aggressive appearing focal osseous lesions. Partially visualized surgical hardware from ACDF in the lower cervical spine. Mild thoracic spondylosis. IMPRESSION: 1. Spectrum of findings compatible with a basilar predominant fibrotic interstitial lung disease without frank honeycombing. No definite disease progression compared to 12/19/2016 chest CT. 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 2. Three-vessel coronary atherosclerosis. 3. Stable mild mediastinal and bilateral hilar adenopathy, nonspecific, probably reactive. Aortic Atherosclerosis (ICD10-I70.0) and Emphysema (ICD10-J43.9). Electronically Signed   By: Delbert Phenix M.D.   On: 10/21/2017 09:30   Dg Chest Port 1 View  Result Date: 10/21/2017 CLINICAL DATA:  Interstitial lung disease EXAM: PORTABLE CHEST 1 VIEW COMPARISON:  10/19/2017 FINDINGS: Central venous catheter tip in the SVC unchanged.  No pneumothorax.  Streaky linear markings in both lung bases appear chronic and unchanged from prior studies. No acute infiltrate effusion or edema. No interval change IMPRESSION: Bibasilar fibrosis stable.  No acute abnormality. Electronically Signed   By: Marlan Palau M.D.   On: 10/21/2017 07:35     Medical Consultants:    None.  Anti-Infectives:   None  Subjective:    YESSIKA OTTE relates her breathing is much better.  Objective:    Vitals:   10/22/17 0822 10/22/17 0825 10/22/17 0900 10/22/17 1000  BP:   (!) 163/102 (!) 141/81  Pulse:   81 91  Resp:   16 (!) 22  Temp:      TempSrc:      SpO2: 95% 95% 92% 95%  Weight:        Intake/Output Summary (Last 24 hours) at 10/22/2017 1132 Last data filed at 10/22/2017 0800 Gross per 24 hour  Intake 170 ml  Output 801 ml  Net -631 ml   Filed Weights   10/19/17 1600 10/20/17 0500 10/21/17 0500  Weight: 82.7 kg (182 lb 5.1 oz) 84.1 kg (185 lb 6.5 oz) 85.5 kg (188 lb 7.9 oz)    Exam: General exam: In no acute distress.  Cushing features. Respiratory system: Good air movement and clear to auscultation. Cardiovascular system: S1 & S2 heard, RRR.  Gastrointestinal system: Abdomen is nondistended, soft and nontender.  Central nervous system: Alert and oriented. No focal neurological deficits. Extremities: No pedal edema. Skin: No rashes, lesions or ulcers Psychiatry: Judgement and insight appear normal. Mood & affect appropriate.    Data Reviewed:    Labs: Basic Metabolic Panel: Recent Labs  Lab 10/19/17 0744 10/19/17 1412 10/20/17 0433 10/21/17 0500 10/22/17 0410  NA 137 138 135 135 134*  K 3.3* 4.8 5.3* 5.1 4.3  CL 103 107  105 106 103  CO2 19* 18* 19* 20* 22  GLUCOSE 169* 242* 344* 335* 324*  BUN 10 13 19  32* 39*  CREATININE 1.50* 1.37* 1.37* 1.29* 1.31*  CALCIUM 8.0* 8.2* 8.3* 8.2* 8.1*  MG 1.2*  --   --  2.0 2.0  PHOS 3.7  --   --  2.7 2.7   GFR Estimated Creatinine Clearance: 47.9 mL/min (A) (by C-G formula based  on SCr of 1.31 mg/dL (H)). Liver Function Tests: Recent Labs  Lab 10/21/17 0500  AST 29  ALT 34  ALKPHOS 78  BILITOT 0.5  PROT 6.4*  ALBUMIN 2.7*   Recent Labs  Lab 10/20/17 0433  LIPASE 24   No results for input(s): AMMONIA in the last 168 hours. Coagulation profile Recent Labs  Lab 10/20/17 1454  INR 1.12    CBC: Recent Labs  Lab 10/19/17 0308  WBC 9.2  NEUTROABS 5.7  HGB 12.0  HCT 36.9  MCV 84.4  PLT 497*   Cardiac Enzymes: Recent Labs  Lab 10/19/17 0744 10/19/17 1412 10/19/17 1931  TROPONINI 0.12* 0.06* 0.04*   BNP (last 3 results) No results for input(s): PROBNP in the last 8760 hours. CBG: Recent Labs  Lab 10/21/17 1541 10/21/17 2015 10/21/17 2358 10/22/17 0332 10/22/17 0750  GLUCAP 284* 350* 346* 300* 269*   D-Dimer: No results for input(s): DDIMER in the last 72 hours. Hgb A1c: No results for input(s): HGBA1C in the last 72 hours. Lipid Profile: No results for input(s): CHOL, HDL, LDLCALC, TRIG, CHOLHDL, LDLDIRECT in the last 72 hours. Thyroid function studies: No results for input(s): TSH, T4TOTAL, T3FREE, THYROIDAB in the last 72 hours.  Invalid input(s): FREET3 Anemia work up: No results for input(s): VITAMINB12, FOLATE, FERRITIN, TIBC, IRON, RETICCTPCT in the last 72 hours. Sepsis Labs: Recent Labs  Lab 10/19/17 0308 10/20/17 0433 10/20/17 1454 10/21/17 0500  PROCALCITON  --  0.18 0.20 0.14  WBC 9.2  --   --   --    Microbiology Recent Results (from the past 240 hour(s))  MRSA PCR Screening     Status: None   Collection Time: 10/19/17 10:46 AM  Result Value Ref Range Status   MRSA by PCR NEGATIVE NEGATIVE Final    Comment:        The GeneXpert MRSA Assay (FDA approved for NASAL specimens only), is one component of a comprehensive MRSA colonization surveillance program. It is not intended to diagnose MRSA infection nor to guide or monitor treatment for MRSA infections. Performed at [facility], [address] [place], [place] [address]   Respiratory Panel by PCR     Status: None   Collection Time: 10/19/17 11:04 AM  Result Value Ref Range Status   Adenovirus NOT DETECTED NOT DETECTED Final   Coronavirus 229E NOT DETECTED NOT DETECTED Final   Coronavirus HKU1 NOT DETECTED NOT DETECTED Final   Coronavirus NL63 NOT DETECTED NOT DETECTED Final   Coronavirus OC43 NOT DETECTED NOT DETECTED Final   Metapneumovirus NOT DETECTED NOT DETECTED Final   Rhinovirus / Enterovirus NOT DETECTED NOT DETECTED Final   Influenza A NOT DETECTED NOT DETECTED Final   Influenza B NOT DETECTED NOT DETECTED Final   Parainfluenza Virus 1 NOT DETECTED NOT DETECTED Final   Parainfluenza Virus 2 NOT DETECTED NOT DETECTED Final   Parainfluenza Virus 3 NOT DETECTED NOT DETECTED Final   Parainfluenza Virus 4 NOT DETECTED NOT DETECTED Final   Respiratory Syncytial Virus NOT DETECTED NOT DETECTED Final  Bordetella pertussis NOT DETECTED NOT DETECTED Final   Chlamydophila pneumoniae NOT DETECTED NOT DETECTED Final   Mycoplasma pneumoniae NOT DETECTED NOT DETECTED Final    Comment: Performed at [facility], [address], [place], [place] [address]     Medications:   . aspirin  81 mg Oral Daily  . atorvastatin  80 mg Oral q1800  . budesonide  0.25 mg Nebulization BID  . chlorhexidine  15 mL Mouth Rinse BID  . enoxaparin (LOVENOX) injection  40 mg Subcutaneous Q24H  . furosemide  80 mg Intravenous Daily  . insulin aspart  0-25 Units Subcutaneous Q4H  . insulin glargine  45 Units Subcutaneous Daily  . mouth rinse  15 mL Mouth Rinse q12n4p  . methylPREDNISolone (SOLU-MEDROL) injection  80 mg Intravenous Q8H  . mycophenolate  360 mg Oral BID  . pantoprazole  40 mg Oral Daily  . sodium chloride flush  3 mL Intravenous Q12H  . sodium chloride flush  3 mL Intravenous Q12H  . tiotropium  18 mcg Inhalation Daily   Continuous Infusions: . sodium chloride    . sodium chloride       LOS: 3 days   Marinda Elk  [facility] Pager [phone]  *Please refer to [email or web address], password [email or web address] to get updated schedule on who will round on this patient, as hospitalists switch teams weekly. If 7PM-7AM, please contact night-coverage at [email or web address], password [email or web address] for any overnight needs.  10/22/2017, 11:32 AM

## 2017-10-22 NOTE — Progress Notes (Signed)
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 She moved to [place] about 7 mo. Ago and this presentation marks > 10 admissions for acute on chronic respiratory failure. She was just discharged from [facility] on 3/12 following an admit for what was felt to be acute on chronic resp failure d/t AECOPD +/- IPF flare w/ volume overload. She was dc'd on pred taper, BDs, and lasix.  She says she was doing well for about the first week after discharge, but then began to develop cough which became more progressive, worsening shortness of breath, intermittent chest discomfort, and nausea which prevented her from taking her pills.  She stated she continued to feel weaker, she was unable to eat, and also not long after her discharge back to home her daughter and granddaughter both had "the flu".  She presents to the emergency room on 3/26. With persistent cough, productive of thick clear sputum.  Resting shortness of breath, and low pulse oximetry, saturations 83% on nasal cannula.  She denied fever, denied nasal congestion, denied headache.  Since hospital presentation she is been treated with BiPAP, IV Lasix, and IV Solu-Medrol.  In spite of these therapies she is spent approximately 8 hours in the emergency room without significant improvement and therefore critical care was asked to evaluate.   LINES/TUBES: Left upper extremity PICC line, this is been in for approximately 6  months  EVENTS 3/26 - Feels about the same after being transitioned to high flow oxygen  3/27 -   - Say PM dx x 2.5  Years wit IvIG and prednisone as Rx. Reports 3-4L [place] at hiome. Here now down to 60L HFNC with BiPAP  QHS. Diuresing and on Steroids (ESR > 110).  ECHO 3/26 with PASP 71 Results for HELAINA, STEFANO (MRN [id number]) as of 10/22/2017 11:12  Ref. Range 10/20/2017 14:54  ANA Ab, IFA Unknown Negative  ANCA Proteinase 3 Latest Ref Range: 0.0 - 3.5 U/mL <3.5  Anti JO-1 Latest Ref Range: 0.0 - 0.9 AI >8.0 (H)  CCP Antibodies IgG/IgA Latest Ref Range: 0 - 19 units 7  ds DNA Ab Latest Ref Range: 0 - 9 IU/mL <1  GBM Ab Latest Ref Range: 0 - 20 units 3  Myeloperoxidase Abs Latest Ref Range: 0.0 - 9.0 U/mL <9.0  RA Latex Turbid. Latest Ref Range: 0.0 - 13.9 IU/mL 11.4  Scleroderma (Scl-70) (ENA) Antibody, IgG Latest Ref Range: 0.0 - 0.9 AI <0.2    3/28 - Right heart cath -    RA = 9 RV = 64/11 PA = 63/26 (41) PCW = 4 Fick cardiac output/index = 4.6/2.5 Thermo CO/CI = 3.8/2.0 PVR = 9.8 WU FA sat = 92% PA sat = 58%, 59% Moderate PAH with low left-sided filling pressures  10/21/2017 - appears to have [place] with normal PCWP (this is likely WHO GRoup 1 with connective tissue diesase +/- WHO group 3) . 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 Per pharmacy - patient was getting IvIG in [place] and was actually oin immuran in dec 2018 Down to 6 L [place]. High flow    SUBJECTIVE/OVERNIGHT/INTERVAL HX 10/22/17 - sill in 25m0.  PFT stil pending. Per RN patient daughter [place] increasing caregiver burden to the daughter who is already ill with medical issues but patient feel daughter can handle her.  RN says hyoxemia better an dcurrntly on 4LNC HF but can ttransition off HF.  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 Was on prednisone 831mday 2 years ago but lsast several months daily 2085mrednisone. Suspects she was on bactrim; [place]. Denies immuran intake but is on med list and [place] [facility] [place] since dec 2018 . After talking to her again, appears she was indeed taking immuran up until admission.    VITAL SIGNS: BP (!) 141/81   Pulse 91   Temp (!) 97.4 F (36.3 C) (Oral)   Resp (!) 22   Wt 85.5 kg (188 lb 7.9 oz)   SpO2 95%   BMI 33.39 kg/m   HEMODYNAMICS:    VENTILATOR SETTINGS:    INTAKE / OUTPUT: I/O last 3 completed shifts: In: 230 [P.O.:170; I.V.:60] Out: 2701 [Urine:2700; Stool:1]  PHYSICAL EXAMINATION:   General Appearance:    Looks better. Surfing phone. Watching TV OBESE - +  Head:    Normocephalic, without obvious abnormality, atraumatic  Eyes:    PERRL - yes, conjunctiva/corneas - clear      Ears:    Normal external ear canals, both ears  Nose:   NG tube - no but has [place] - 4L [place]   Throat:  ETT TUBE - no , OG tube - no  Neck:   Supple,  No enlargement/tenderness/nodules     Lungs:     Crackeles bilaterally  Chest wall:    No deformity  Heart:    S1 and S2 normal, no murmur, CVP - no.  Pressors - no  Abdomen:     Soft, no masses, no organomegaly  Genitalia:    Not done  Rectal:   not done  Extremities:   Extremities- intact     Skin:   Intact in exposed areas .      Neurologic:   Sedation - none -> RASS - NA . Moves all 4s - yes. CAM-ICU - neg for delirium . Orientation - x 3+        LABS: PULMONARY Recent Labs  Lab 10/19/17 0050 10/19/17 0834 10/21/17 1208  PHART 7.449 7.508*  --   PCO2ART 23.0* 24.8*  --   PO2ART 241.0* 101.0  --   HCO3 15.9* 19.7* 19.9*  20.8  TCO2 17* 20* 21*  22  O2SAT 100.0 98.0 58.0   59.0    CBC Recent Labs  Lab 10/19/17 0308  HGB 12.0  HCT 36.9  WBC 9.2  PLT 497*    COAGULATION Recent Labs  Lab 10/20/17 1454  INR 1.12    CARDIAC  Recent Labs  Lab 10/19/17 0744 10/19/17 1412 10/19/17 1931  TROPONINI 0.12* 0.06* 0.04*   No results for input(s): PROBNP in the last 168 hours.   CHEMISTRY Recent Labs  Lab 10/19/17 0744 10/19/17 1412 10/20/17 0433 10/21/17 0500 10/22/17 0410  NA 137 138 135 135 134*  K 3.3* 4.8 5.3* 5.1 4.3  CL 103 107 105 106 103  CO2 19* 18* 19* 20* 22  GLUCOSE 169* 242* 344* 335* 324*  BUN 10 13 19  32* 39*  CREATININE 1.50* 1.37* 1.37* 1.29* 1.31*  CALCIUM 8.0* 8.2* 8.3* 8.2* 8.1*  MG 1.2*  --   --  2.0 2.0  PHOS 3.7  --   --  2.7 2.7   Estimated Creatinine Clearance: 47.9 mL/min (A) (by C-G formula based on SCr of 1.31 mg/dL (H)).   LIVER Recent Labs  Lab 10/20/17 1454 10/21/17 0500  AST  --  29  ALT  --  34  ALKPHOS  --  78  BILITOT  --  0.5  PROT  --  6.4*  ALBUMIN  --  2.7*  INR 1.12  --      INFECTIOUS Recent Labs  Lab 10/20/17 0433 10/20/17 1454 10/21/17 0500  PROCALCITON 0.18 0.20 0.14     ENDOCRINE CBG (last 3)  Recent Labs    10/21/17 2358 10/22/17 0332 10/22/17 0750  GLUCAP 346* 300* 269*         IMAGING x48h  - image(s) personally visualized  -   highlighted in bold Ct Chest High Resolution  Result Date: 10/21/2017 CLINICAL DATA:  Reported history of interstitial lung disease. Inpatient. EXAM: CT CHEST WITHOUT CONTRAST TECHNIQUE: Multidetector CT imaging of the chest was performed following the standard protocol without intravenous contrast. High resolution imaging of the lungs, as well as inspiratory and expiratory imaging, was performed. COMPARISON:  09/30/2017 chest CT. Chest radiograph from one day prior. FINDINGS: Cardiovascular: Normal heart size. No significant pericardial fluid/thickening. Left anterior descending, left circumflex and right coronary atherosclerosis.  Left PICC terminates in the upper third of the superior vena cava. Atherosclerotic nonaneurysmal thoracic aorta. Borderline prominent main pulmonary artery (3.4 cm diameter), stable. Mediastinum/Nodes: No discrete thyroid nodules. Unremarkable esophagus. No axillary adenopathy. Stable mild right paratracheal adenopathy up to 1.1 cm (series 5/image 38). Stable mild bilateral hilar adenopathy, poorly delineated on these noncontrast images. Lungs/Pleura: No pneumothorax. No pleural effusion. Moderate centrilobular emphysema with mild diffuse bronchial wall thickening. No acute consolidative airspace disease, lung masses or significant pulmonary nodules. There is patchy subpleural and peribronchovascular reticulation and ground-glass attenuation throughout both lungs with associated mild traction bronchiectasis and architectural distortion. There is a strong basilar gradient to these findings. No frank honeycombing. No definite disease progression compared to the 12/19/2016 chest CT. No significant lobular air trapping on the expiration sequence. Upper abdomen: Partially visualized exophytic 2.9 cm simple lateral upper right renal cyst. Musculoskeletal: No aggressive appearing focal osseous lesions. Partially visualized surgical hardware from ACDF in the lower cervical spine. Mild thoracic spondylosis. IMPRESSION: 1. Spectrum of findings compatible with a basilar predominant fibrotic interstitial lung disease without frank honeycombing. No definite disease progression compared to 12/19/2016 chest CT. 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 2. Three-vessel coronary atherosclerosis. 3. Stable mild mediastinal and bilateral hilar adenopathy, nonspecific, probably reactive. Aortic  Atherosclerosis (ICD10-I70.0) and Emphysema (ICD10-J43.9). Electronically Signed  By: Ilona Sorrel M.D.   On: 10/21/2017 09:30   Dg Chest Port 1 View  Result Date: 10/21/2017 CLINICAL DATA:  Interstitial lung disease EXAM: PORTABLE CHEST 1 VIEW COMPARISON:  10/19/2017 FINDINGS: Central venous catheter tip in the SVC unchanged.  No pneumothorax. Streaky linear markings in both lung bases appear chronic and unchanged from prior studies. No acute infiltrate effusion or edema. No interval change IMPRESSION: Bibasilar fibrosis stable.  No acute abnormality. Electronically Signed   By: Franchot Gallo M.D.   On: 10/21/2017 07:35     DISCUSSION: Acute on chronic hypoxic respiratory failure in the setting of decompensated diastolic heart failure with pulmonary edema superimposed on secondary pulmonary artery hypertension, and underlying presumptive diagnosis of ILD with  COPD.  I suspect her inability to take her home medications is the greatest contributor to her decompensation.  I am not convinced she is infected, but she did have flu exposure.  We will treat her with pulse steroids, aggressive diuresis, high flow oxygen and nocturnal BiPAP.  I think we need to consider skilled nursing at discharge  ASSESSMENT / PLAN:  Acute on chronic hypoxic respiratory failure.  This seems to be multifactorial, but most likely acute diastolic heart failure with pulmonary edema superimposed on underlying presumptive diagnosis of IPF, COPD, and resultant secondary PAH.  Suspect that this has been exacerbated by her inability to take her home medications, but also need to consider possible recent viral exposure  10/21/2017  -  Now down to 6L [place]. 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 Home prednisone 233mdaily  10/22/2017 - Now down 4L [place]. She is feeling better   Plan - continue steroids IV - 8066msolumedrol  Q8h -> change to Q12h and on 10/25/17 go to  prednisone 82m68my v40mg59mly (opd was 20mg 48my) - furrhter titration in ILD clinic - continue o2 and wean as tolerated  -- get full PFT; ordered for inpatient - Pulm Htn consult - Dr BensimJeffie Pollockppt needed  - ILD clinic - fillowup needed- steroids needed + opd MYFORTIC (on ppi so preferred to cellcept) but will start 10/22/2017  + prophlayctic bactrim 1DS on MWF (will check G6PD but has taken bactrim in past) - PRognosis: long term over year is  poor given her advance o2 need unless PAH drugs improve her and she will need to improve from hospitalization - can move to floor  PCCM will see again 10/25/17   Patient updated   > 50% of this > 40 min visit spent in face to face counseling or/and coordination of care      Future Appointments  Date Time Provider DepartHolmes2019  9:15 AM RamaswBrand Males[facility] None  11/08/2017  9:30 AM WillisKathrynn DuckingNA[facility] None     Dr. MuraliBrand Males, F.C.C.P Pulmonary and Critical Care Medicine Staff Physician, [facility]  [facility]  Pulmonary [id number]: [phone] answer or between  15:00h - 7:00h: call [phone] Telephone: [phone]

## 2017-10-22 NOTE — Progress Notes (Signed)
Patient referred to the HF [employer].  I have sent appropriate paperwork via secure email to the Paramedic team.

## 2017-10-22 NOTE — Progress Notes (Signed)
Report received from Sending unit at 18:10 approximately.  Advised in report that they would hold patient until Pharmacy sent her lantus - 20 units, and then would give and bring pt over. Pt arrived to unit at 18:45 and lantus was not sent or given to Pt.  RN advised that this dose of lantus and her evening dose of Lantus where to be given - she would send lantus over if received from pharmacy Report given to [facility], Per handoff from sending unit - that Pt was to get this and the night dose of Lantus - once we received it.

## 2017-10-22 NOTE — Consult Note (Addendum)
Advanced Heart Failure Team Consult Note   Primary Physician: Patient, No Pcp Per PCP-Cardiologist:  No primary care provider on file.  Reason for Consultation: PAH  HPI:    Elizabeth Montes is seen today for evaluation of PAH at the request of Dr. Chase Caller.  Elizabeth Montes is a 62 y.o. 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   Recently discharged 10/05/17 with A/C respiratory failure. Treated with steroid taper and lasix.   Pt developed worsening cough and SOB approx 1 week after discharge. She also had intermittent chest discomfort. She did not pick up her medicines after discharge, so was out for about a week.  She presented to the ED with tachypnea in the 35-50 range. ABG showed progressing resp failure despite BiPAP so admitted to [facility] for possible intubation.  Ultimately she improved with diuresis, nebs, and steroids and did not require intubation.  Pertinent labs on admission include Na 137, K 2.9, Cl 102, CO2 18, BUN 11, Creatinine 1.57, Calcium 8.3, Anion gap 17, BNP 840, WBC 9.2, Hgb 12.  CXR showed stable bibasilar fibrosis with no acute changes.   Dr. Haroldine Laws asked to cath with h/o PAH and elevated PA pressure on Echo. Cath 10/21/17 showed at moderate PAH with low left sided filling pressures. So diuretics stopped.   Patient feeling better today.  She states at home she is on chronic O2. She is a former long time smoker up to 1 ppd x 40 years but quit when she "got sick" about 2 years ago. She drinks excessive amounts of water and ice throughout the day. She states that she ways daily. As above, was out of meds 2 weeks PTA. She takes lasix at home, but states she hasn't needed "extra" in a long time.   Hi-res CT 10/20/17: stable pulmonary fibrosis  Personally reviewed   Echo LVEF 55-60%, Grade 1 DD. PA peak pressure 71, Moderate MR, RV mod reduced, Trivial AI. Personally  reviewed   RHC 10/21/17 Findings:   RA = 9 RV = 64/11 PA = 63/26 (41) PCW = 4 Fick cardiac output/index = 4.6/2.5 Thermo CO/CI = 3.8/2.0 PVR = 9.8 WU FA sat = 92% PA sat = 58%, 59%  Assessment:  1. Moderate PAH with low left-sided filling pressures  Review of Systems: [y] = yes, [ ]  = no   General: Weight gain [ ] ; Weight loss [ ] ; Anorexia [ ] ; Fatigue [ ] ; Fever [ ] ; Chills [ ] ; Weakness [y]  Cardiac: Chest pain/pressure [ ] ; Resting SOB [y]; Exertional SOB [y]; Orthopnea [ ] ; Pedal Edema [ ] ; Palpitations [ ] ; Syncope [ ] ; Presyncope [ ] ; Paroxysmal nocturnal dyspnea[ ]   Pulmonary: Cough [y]; Wheezing[y]; Hemoptysis[ ] ; Sputum [ ] ; Snoring [ ]   GI: Vomiting[ ] ; Dysphagia[ ] ; Melena[ ] ; Hematochezia [ ] ; Heartburn[ ] ; Abdominal pain [ ] ; Constipation [ ] ; Diarrhea [ ] ; BRBPR [ ]   GU: Hematuria[ ] ; Dysuria [ ] ; Nocturia[ ]   Vascular: Pain in legs with walking [ ] ; Pain in feet with lying flat [ ] ; Non-healing sores [ ] ; Stroke [ ] ; TIA [ ] ; Slurred speech [ ] ;  Neuro: Headaches[ ] ; Vertigo[ ] ; Seizures[ ] ; Paresthesias[ ] ;Blurred vision [ ] ; Diplopia [ ] ; Vision changes [ ]   Ortho/Skin: Arthritis [y]; Joint pain [y]; Muscle pain [ ] ; Joint swelling [ ] ; Back Pain [ ] ; Rash [ ]   Psych:  Depression[ ] ; Anxiety[ ]   Heme: Bleeding problems [ ] ; Clotting disorders [ ] ; Anemia [ ]   Endocrine: Diabetes [ ] ; Thyroid dysfunction[ ]   Home Medications Prior to Admission medications   Medication Sig Start Date End Date Taking? Authorizing Provider  furosemide (LASIX) 80 MG tablet Take 1 tablet (80 mg total) by mouth daily. 09/18/17  Yes Irene Pap N, DO  amLODipine (NORVASC) 10 MG tablet Take 1 tablet (10 mg total) by mouth daily. 09/18/17   Kayleen Memos, DO  antiseptic oral rinse (BIOTENE) LIQD Take 15 mLs by mouth as needed for dry mouth. 08/27/17   [provider]  aspirin 81 MG EC tablet Take 1 tablet (81 mg total) by mouth daily. 09/18/17   Kayleen Memos, DO  atorvastatin  (LIPITOR) 80 MG tablet Take 80 mg by mouth daily at 6 PM. 10/14/17   [provider]  azaTHIOprine (IMURAN) 50 MG tablet Take 0.5 tablets (25 mg total) by mouth daily. 09/18/17   Kayleen Memos, DO  bisacodyl (DULCOLAX) 5 MG EC tablet Take 1 tablet (5 mg total) by mouth daily as needed for mild constipation. 09/17/17   Kayleen Memos, DO  blood glucose meter kit and supplies Dispense based on patient and insurance preference. Use up to four times daily as directed. (FOR ICD-10 E10.9, E11.9). 10/04/17   Alma Friendly, MD  budesonide (PULMICORT) 0.25 MG/2ML nebulizer solution Take 2 mLs (0.25 mg total) by nebulization 2 (two) times daily. 10/04/17   Alma Friendly, MD  fenofibrate 160 MG tablet Take 1 tablet (160 mg total) by mouth daily. 09/18/17   Kayleen Memos, DO  insulin aspart (NOVOLOG FLEXPEN) 100 UNIT/ML FlexPen Inject 25 Units into the skin 3 (three) times daily with meals.     [provider]  liraglutide (VICTOZA) 18 MG/3ML SOPN Inject 1.2 mg into the skin daily. 07/28/17   [provider]  metoCLOPramide (REGLAN) 10 MG tablet Take 5 mg by mouth 3 (three) times daily before meals.    [provider]  metoprolol tartrate (LOPRESSOR) 25 MG tablet Take 1 tablet (25 mg total) by mouth 2 (two) times daily. 09/17/17   Kayleen Memos, DO  oxyCODONE (OXY IR/ROXICODONE) 5 MG immediate release tablet Take 5 mg by mouth every 6 (six) hours as needed (for pain).    [provider]  pantoprazole (PROTONIX) 40 MG tablet Take 1 tablet (40 mg total) by mouth 2 (two) times daily before a meal. 10/04/17 11/03/17  Alma Friendly, MD  Potassium Chloride ER 20 MEQ TBCR Take 20 mEq by mouth daily. 10/14/17   [provider]  predniSONE (DELTASONE) 10 MG tablet Take 4 tablets for 2 days, then 3 tablets for 2 days, then continue with 2 tablets 10/04/17   Alma Friendly, MD  tiotropium (SPIRIVA HANDIHALER) 18 MCG inhalation capsule Place 18 mcg into  inhaler and inhale daily as needed (for wheezing).     [provider]  Obie Dredge 300 UNIT/ML [facility]  10/14/17   [provider]    Past Medical History: Past Medical History:  Diagnosis Date  . CAD (coronary artery disease) w/ stent 2017 ([place])   . CHF (congestive heart failure) ([place])   . COPD (chronic obstructive pulmonary disease) ([place])   . Diabetes mellitus without complication ([place])   . Heart attack [facility]) 06/09/2016   06/09/2016 cath/PCI (inf STEMI): 60-70% proximal LAD, LCx normal, RCA mid 80-90% -> Rebel 3.5 x 32 mm BMS to  mRCA and Rebel 3.5 x 28 mm BMS to proximal LAD   . HLD (hyperlipidemia)   . Hypertension   . Obesity   . Polymyositis ([place])   . Pulmonary fibrosis ([place])   . Pulmonary hypertension ([place])     Past Surgical History: Past Surgical History:  Procedure Laterality Date  . ABDOMINAL SURGERY    . RIGHT HEART CATH N/A 10/21/2017   Procedure: RIGHT HEART CATH;  Surgeon: Jolaine Artist, MD;  Location: [place] CV LAB;  Service: Cardiovascular;  Laterality: N/A;  . TRACHEOSTOMY      Family History: Family History  Problem Relation Age of Onset  . Cancer Mother   . Stroke Brother   . Diabetes Sister   . Stroke Sister     Social History: Social History   Socioeconomic History  . Marital status: Single    Spouse name: Not on file  . Number of children: Not on file  . Years of education: Not on file  . Highest education level: Not on file  Occupational History  . Not on file  Social Needs  . Financial resource strain: Not on file  . Food insecurity:    Worry: Not on file    Inability: Not on file  . Transportation needs:    Medical: Not on file    Non-medical: Not on file  Tobacco Use  . Smoking status: Never Smoker  . Smokeless tobacco: Never Used  Substance and Sexual Activity  . Alcohol use: No  . Drug use: No  . Sexual activity: Not on file  Lifestyle  . Physical activity:    Days per week: Not on file     Minutes per session: Not on file  . Stress: Not on file  Relationships  . Social connections:    Talks on phone: Not on file    Gets together: Not on file    Attends religious service: Not on file    Active member of club or organization: Not on file    Attends meetings of clubs or organizations: Not on file    Relationship status: Not on file  Other Topics Concern  . Not on file  Social History Narrative  . Not on file    Allergies:  Allergies  Allergen Reactions  . Lovenox [Enoxaparin Sodium] Other (See Comments)    "made me bleed"    Objective:    Vital Signs:   Temp:  [97.4 F (36.3 C)-98.1 F (36.7 C)] 97.4 F (36.3 C) (03/29 0756) Pulse Rate:  [80-105] 91 (03/29 1000) Resp:  [11-32] 22 (03/29 1000) BP: (105-164)/(66-102) 141/81 (03/29 1000) SpO2:  [90 %-100 %] 95 % (03/29 1000) Last BM Date: 10/22/17  Weight change: Filed Weights   10/19/17 1600 10/20/17 0500 10/21/17 0500  Weight: 182 lb 5.1 oz (82.7 kg) 185 lb 6.5 oz (84.1 kg) 188 lb 7.9 oz (85.5 kg)    Intake/Output:   Intake/Output Summary (Last 24 hours) at 10/22/2017 1113 Last data filed at 10/22/2017 0800 Gross per 24 hour  Intake 170 ml  Output 801 ml  Net -631 ml      Physical Exam    General:  NAD Lying in bed. No resp difficulty  HEENT: normal Neck: supple. JVP difficult due to body habitus. Carotids 2+ bilat; no bruits. No lymphadenopathy or thyromegaly appreciated. Cor: PMI nondisplaced. Regular rate & rhythm. No rubs, gallops or murmurs. Lungs: Basilar crackles, mild to moderately diminished throughout. No wheezing  Abdomen: Obese Soft, nontender, nondistended.  No hepatosplenomegaly. No bruits or masses. Good bowel sounds. Extremities: no cyanosis, clubbing, rash, edema Neuro: alert & orientedx3, cranial nerves grossly intact. moves all 4 extremities w/o difficulty. Affect pleasant  Telemetry   NSR 90s currently, personally reviewed.  EKG    Sinus tach 137 on admit, personally  reviewed.   Labs   Basic Metabolic Panel: Recent Labs  Lab 10/19/17 0744 10/19/17 1412 10/20/17 0433 10/21/17 0500 10/22/17 0410  NA 137 138 135 135 134*  K 3.3* 4.8 5.3* 5.1 4.3  CL 103 107 105 106 103  CO2 19* 18* 19* 20* 22  GLUCOSE 169* 242* 344* 335* 324*  BUN 10 13 19  32* 39*  CREATININE 1.50* 1.37* 1.37* 1.29* 1.31*  CALCIUM 8.0* 8.2* 8.3* 8.2* 8.1*  MG 1.2*  --   --  2.0 2.0  PHOS 3.7  --   --  2.7 2.7    Liver Function Tests: Recent Labs  Lab 10/21/17 0500  AST 29  ALT 34  ALKPHOS 78  BILITOT 0.5  PROT 6.4*  ALBUMIN 2.7*   Recent Labs  Lab 10/20/17 0433  LIPASE 24   No results for input(s): AMMONIA in the last 168 hours.  CBC: Recent Labs  Lab 10/19/17 0308  WBC 9.2  NEUTROABS 5.7  HGB 12.0  HCT 36.9  MCV 84.4  PLT 497*    Cardiac Enzymes: Recent Labs  Lab 10/19/17 0744 10/19/17 1412 10/19/17 1931  TROPONINI 0.12* 0.06* 0.04*    BNP: BNP (last 3 results) Recent Labs    09/13/17 0345 09/30/17 0623 10/19/17 0308  BNP 41.3 79.2 839.6*    ProBNP (last 3 results) No results for input(s): PROBNP in the last 8760 hours.   CBG: Recent Labs  Lab 10/21/17 1541 10/21/17 2015 10/21/17 2358 10/22/17 0332 10/22/17 0750  GLUCAP 284* 350* 346* 300* 269*    Coagulation Studies: Recent Labs    10/20/17 1454  LABPROT 14.3  INR 1.12     Imaging    No results found.   Medications:     Current Medications: . aspirin  81 mg Oral Daily  . atorvastatin  80 mg Oral q1800  . budesonide  0.25 mg Nebulization BID  . chlorhexidine  15 mL Mouth Rinse BID  . enoxaparin (LOVENOX) injection  40 mg Subcutaneous Q24H  . furosemide  80 mg Intravenous Daily  . insulin aspart  0-25 Units Subcutaneous Q4H  . insulin glargine  45 Units Subcutaneous Daily  . mouth rinse  15 mL Mouth Rinse q12n4p  . methylPREDNISolone (SOLU-MEDROL) injection  80 mg Intravenous Q8H  . pantoprazole  40 mg Oral Daily  . sodium chloride flush  3 mL  Intravenous Q12H  . sodium chloride flush  3 mL Intravenous Q12H  . tiotropium  18 mcg Inhalation Daily     Infusions: . sodium chloride    . sodium chloride         Patient Profile   Elizabeth Montes is a 61 y.o. 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   Admitted 10/19/17 with acute respiratory failure requiring BiPAP.   Assessment/Plan   1. PAH - VQ scan negative 05/08/2017 in [place]. VQ scan and PFTs. She denies knowledge of previous clots.  - PFTs ordered. She states she had these last year in [place]. But important to recheck with symptoms.  - Suspect primarily group 3 +/- group 1.  Await PFTs.  2. Chronic diastolic CHF - Needs to manage her fluid more closely at home.  - Volume status stable on exam.  - Continue lasix 80 mg daily po.  Should take an extra 80 in the evening for weights 4 lbs or more above her baseline.   3. Pulmonary fibrosis - Continue home O2. Per CCM.  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   4. Acute on chronic respiratory failure - Now on her home O2 via [place].  - Tapering steroids - PFTs pending.   5. DM 2 - Per PCP  6. Social - She would be a good paramedicine candidate. Will refer.   Medication concerns reviewed with patient and pharmacy team. Barriers identified: ? Compliance.   Length of Stay: 3  Annamaria Helling  10/22/2017, 11:13 AM  Advanced Heart Failure Team Pager [phone] (M-F; 7a - 4p)  Please contact [facility] for night-coverage after hours (4p -7a ) and weekends on [email or web address]  Patient seen and examined with the above-signed Advanced Practice Provider and/or Housestaff. I personally reviewed laboratory data, imaging studies and relevant notes. I independently examined the patient and formulated the important aspects of the plan. I have edited the note to reflect any of my changes or  salient points. I have personally discussed the plan with the patient and/or family.  Echo, CT and cath results all reviewed personally and discussed with her. She has mild to moderate PAH likely due to pulmonary fibrosis and potentially a component of diastolic HF. (who GROUP II and III)  Previous VQ negative. The question is if she also has a component of WHO Group I disease which would respond to selective pulmonary vasodilators. Will get PFTs and if FEV1 not too bad will attempt cautious trial of macitentan.   I suspect most of the fluctuations in her respiratory status are due to volume overload and we had a long talk about sliding scale diuretic regimen. Will arrange paramedicine follow-up on discharge.   We will sign off and arrange outpatient f/u.   Glori Bickers, MD  5:56 PM

## 2017-10-23 LAB — BASIC METABOLIC PANEL
Anion gap: 12 (ref 5–15)
BUN: 36 mg/dL — ABNORMAL HIGH (ref 6–20)
CALCIUM: 8.2 mg/dL — AB (ref 8.9–10.3)
CO2: 24 mmol/L (ref 22–32)
Chloride: 100 mmol/L — ABNORMAL LOW (ref 101–111)
Creatinine, Ser: 1.24 mg/dL — ABNORMAL HIGH (ref 0.44–1.00)
GFR calc Af Amer: 54 mL/min — ABNORMAL LOW (ref >60)
GFR, EST NON AFRICAN AMERICAN: 47 mL/min — AB (ref >60)
GLUCOSE: 352 mg/dL — AB (ref 65–99)
Potassium: 4.4 mmol/L (ref 3.5–5.1)
SODIUM: 136 mmol/L (ref 135–145)

## 2017-10-23 LAB — PHOSPHORUS: Phosphorus: 2.2 mg/dL — ABNORMAL LOW (ref 2.5–4.6)

## 2017-10-23 LAB — GLUCOSE, CAPILLARY
GLUCOSE-CAPILLARY: 354 mg/dL — AB (ref 65–99)
Glucose-Capillary: 382 mg/dL — ABNORMAL HIGH (ref 65–99)

## 2017-10-23 LAB — MAGNESIUM: MAGNESIUM: 2 mg/dL (ref 1.7–2.4)

## 2017-10-23 MED ORDER — SULFAMETHOXAZOLE-TRIMETHOPRIM 800-160 MG PO TABS: 1.0000 | ORAL_TABLET | ORAL | 0 refills | Status: DC

## 2017-10-23 MED ORDER — MYCOPHENOLATE SODIUM 360 MG PO TBEC: 360.0000 mg | DELAYED_RELEASE_TABLET | Freq: Two times a day (BID) | ORAL | 0 refills | Status: DC

## 2017-10-23 NOTE — Discharge Summary (Signed)
Physician Discharge Summary  Elizabeth Montes HCW:[id number] DOB: 06-28-1956 DOA: 10/19/2017  PCP: Patient, No Pcp Per  Admit date: 10/19/2017 Discharge date: 10/23/2017  Time spent: 33 minutes  Recommendations for Outpatient Follow-up:  1. With PCP and pulmonologist  2. Cardiology follow up for cardiac catheterization   Discharge Diagnoses:  Principal Problem:   Acute on chronic respiratory failure with hypoxia ([place]) Active Problems:   CHF (congestive heart failure) ([place])   Acute kidney injury ([place])   Pulmonary fibrosis ([place])   Pulmonary hypertension   Obesity   HLD (hyperlipidemia)   Diabetes mellitus without complication ([facility])   CAD (coronary artery disease)   Acute hypokalemia   HTN (hypertension)   Acute on chronic respiratory failure ([place])   Discharge Condition: Good  Diet recommendation: Heart Healthy, Diabetic diet  Filed Weights   10/20/17 0500 10/21/17 0500 10/22/17 1853  Weight: 84.1 kg (185 lb 6.5 oz) 85.5 kg (188 lb 7.9 oz) 83.8 kg (184 lb 11.9 oz)    History of present illness:  Elizabeth Montes is a 62 y.o. female with medical history significant for hypertension, dyslipidemia, diabetes on Victoza, obesity, polymyositis with history of IVIG infusions, chronic hypoxemic respiratory failure in the context of COPD but more likely related to ILD/pulmonary fibrosis with associated pulmonary hypertension, mild concentric hypertrophy with history of heart failure on chronic Lasix.  Patient was discharged on 3/11 after an admission for COPD exacerbation in context of underlying pulmonary fibrosis and chronic hypoxemia.  She was treated with IV steroids, Lasix and nebulizer treatments.  Of note this is the patient's 11th admission in the past 6 months.  She recently relocated from [place], [place] in January.  Patient reports that for the past 2 weeks she has been out of her medications and only recently followed up with her PCP to apparently obtain refills.  She did have  oxygen available to her and her baseline is between 3-4 L/min.  Patient reports 1 week of progressive shortness of breath, orthopnea and dyspnea on exertion that has worsened over the previous 24 hours before admission. She was diagnosed with acute on chronic respiratory failure and admitted.    Hospital Course:  Patient was admitted with acute on chronic respiratory failure worse secondary to COPD exacerbation but also congestive heart failure.  She has been noncompliant with her medications. She was treated with IV Solu-Medrol with nebulizer.  Transferred to critical care medicine after admission due to worsening respiratory failure.  Patient ultimately was treated and transferred back to our service.  He had acute kidney injury with creatinine of 1.7 as well as hypokalemia among other things.  Echocardiogram was done with EF of more than 60%.  Potassium was repleted. Had significant tachycardia secondary to noncompliance.  With a history of coronary artery disease and respiratory failure right heart catheterization was done on 10/21/17 showing moderate pulmonary arterial hypertension was decreased filling pressures. Patient was ultimately transitioned to oral medications.  Blood sugar was controlled.  Patient discharged home to follow with PCP.  Procedures:  Right heart Catheterization   Consultations:  Dr Chase Caller, Pulmonary  Dr Mariel Craft, Cardiology  Discharge Exam: Vitals:   10/22/17 2241 10/23/17 0541  BP: 128/70 131/67  Pulse: 96 84  Resp: 18 (!) 24  Temp: (!) 97.5 F (36.4 C) 97.7 F (36.5 C)  SpO2: 92% 96%    General: Stable, NAD Cardiovascular: RRR Respiratory: Good AE no wheeze  Discharge Instructions   Discharge Instructions    Diet - low  sodium heart healthy   Complete by:  As directed    Diet - low sodium heart healthy   Complete by:  As directed    Increase activity slowly   Complete by:  As directed    Increase activity slowly   Complete by:  As  directed      Allergies as of 10/23/2017      Reactions   Lovenox [enoxaparin Sodium] Other (See Comments)   "made me bleed"      Medication List    TAKE these medications   amLODipine 10 MG tablet Commonly known as:  NORVASC Take 1 tablet (10 mg total) by mouth daily.   antiseptic oral rinse Liqd Take 15 mLs by mouth as needed for dry mouth.   aspirin 81 MG EC tablet Take 1 tablet (81 mg total) by mouth daily.   atorvastatin 80 MG tablet Commonly known as:  LIPITOR Take 80 mg by mouth daily at 6 PM.   azaTHIOprine 50 MG tablet Commonly known as:  IMURAN Take 0.5 tablets (25 mg total) by mouth daily.   bisacodyl 5 MG EC tablet Commonly known as:  DULCOLAX Take 1 tablet (5 mg total) by mouth daily as needed for mild constipation.   blood glucose meter kit and supplies Dispense based on patient and insurance preference. Use up to four times daily as directed. (FOR ICD-10 E10.9, E11.9).   budesonide 0.25 MG/2ML nebulizer solution Commonly known as:  PULMICORT Take 2 mLs (0.25 mg total) by nebulization 2 (two) times daily.   fenofibrate 160 MG tablet Take 1 tablet (160 mg total) by mouth daily.   furosemide 80 MG tablet Commonly known as:  LASIX Take 1 tablet (80 mg total) by mouth daily.   liraglutide 18 MG/3ML Sopn Commonly known as:  VICTOZA Inject 1.2 mg into the skin daily.   metoCLOPramide 10 MG tablet Commonly known as:  REGLAN Take 5 mg by mouth 3 (three) times daily before meals.   metoprolol tartrate 25 MG tablet Commonly known as:  LOPRESSOR Take 1 tablet (25 mg total) by mouth 2 (two) times daily.   mycophenolate 360 MG Tbec EC tablet Commonly known as:  MYFORTIC Take 1 tablet (360 mg total) by mouth 2 (two) times daily.   NOVOLOG FLEXPEN 100 UNIT/ML FlexPen Generic drug:  insulin aspart Inject 25 Units into the skin 3 (three) times daily with meals.   oxyCODONE 5 MG immediate release tablet Commonly known as:  Oxy IR/ROXICODONE Take 5 mg  by mouth every 6 (six) hours as needed (for pain).   pantoprazole 40 MG tablet Commonly known as:  PROTONIX Take 1 tablet (40 mg total) by mouth 2 (two) times daily before a meal.   Potassium Chloride ER 20 MEQ Tbcr Take 20 mEq by mouth daily.   predniSONE 10 MG tablet Commonly known as:  DELTASONE Take 4 tablets for 2 days, then 3 tablets for 2 days, then continue with 2 tablets   SPIRIVA HANDIHALER 18 MCG inhalation capsule Generic drug:  tiotropium Place 18 mcg into inhaler and inhale daily as needed (for wheezing).   sulfamethoxazole-trimethoprim 800-160 MG tablet Commonly known as:  BACTRIM DS,SEPTRA DS Take 1 tablet by mouth every Monday, Wednesday, and Friday. Start taking on:  10/25/2017   TOUJEO SOLOSTAR 300 UNIT/ML Sopn Generic drug:  Insulin Glargine      Allergies  Allergen Reactions  . Lovenox [Enoxaparin Sodium] Other (See Comments)    "made me bleed"   Follow-up Information  [facility] Follow up on 11/03/2017.   Specialty:  Cardiology Why:  at 200 pm for post hospital follow up. The code for parking is 1100. Leisure centre manager thru [address] off of [address]. Underground parking on your right. Can also park in lower ED lot and enter blue awning.  Contact information: [address] [id number] [place] [phone]           The results of significant diagnostics from this hospitalization (including imaging, microbiology, ancillary and laboratory) are listed below for reference.    Significant Diagnostic Studies: Dg Chest 2 View  Result Date: 10/12/2017 CLINICAL DATA:  Chest pain.  Shortness of breath.  Cough. EXAM: CHEST - 2 VIEW COMPARISON:  Radiograph and CT 09/30/2017 FINDINGS: Left upper extremity central venous catheter with tip in the proximal SVC. Lower lung volumes from prior exam. Interstitial coarsening likely secondary to interstitial lung disease, better characterized on  CT. Linear bibasilar opacities consistent with atelectasis or scarring. No new airspace opacity. No pleural effusion or pneumothorax. No acute osseous abnormalities. Postsurgical change in the lower cervical spine is partially included. IMPRESSION: Similar radiographic findings of interstitial lung disease. No evidence of acute abnormality. Electronically Signed   By: Jeb Levering M.D.   On: 10/12/2017 00:42   Ct Chest Wo Contrast  Result Date: 09/30/2017 CLINICAL DATA:  Increased shortness of breath. EXAM: CT CHEST WITHOUT CONTRAST TECHNIQUE: Multidetector CT imaging of the chest was performed following the standard protocol without IV contrast. COMPARISON:  CT chest dated August 01, 2017. FINDINGS: Cardiovascular: No significant vascular findings. Normal heart size. No pericardial effusion. Normal caliber thoracic aorta. Coronary, aortic arch, and branch vessel atherosclerotic vascular disease. Mild enlargement of the main pulmonary artery, measuring up to 3.1 cm. Left-sided PICC line with the tip in the proximal SVC. Mediastinum/Nodes: Stable mildly enlarged mediastinal and bilateral hilar lymph nodes, measuring up to 1.5 cm. No axillary lymphadenopathy. The thyroid and esophagus are unremarkable. Lungs/Pleura: Persistent basilar predominant inter and interlobular septal thickening with scattered subpleural ground-glass density, overall similar to prior study. Bibasilar atelectasis. Mild to moderate centrilobular emphysema. No focal consolidation, pleural effusion, or pneumothorax. No suspicious pulmonary nodule. Upper Abdomen: No acute abnormality. Musculoskeletal: No acute or suspicious osseous findings. IMPRESSION: 1. No definite acute intrathoracic process. Chronic changes consistent with interstitial lung disease are overall similar to prior study. 2. Stable mildly enlarged mediastinal and bilateral hilar lymph nodes, favored reactive. 3.  Emphysema (ICD10-J43.9). 4.  Aortic atherosclerosis  (ICD10-I70.0). Electronically Signed   By: Titus Dubin M.D.   On: 09/30/2017 08:50   Ct Chest High Resolution  Result Date: 10/21/2017 CLINICAL DATA:  Reported history of interstitial lung disease. Inpatient. EXAM: CT CHEST WITHOUT CONTRAST TECHNIQUE: Multidetector CT imaging of the chest was performed following the standard protocol without intravenous contrast. High resolution imaging of the lungs, as well as inspiratory and expiratory imaging, was performed. COMPARISON:  09/30/2017 chest CT. Chest radiograph from one day prior. FINDINGS: Cardiovascular: Normal heart size. No significant pericardial fluid/thickening. Left anterior descending, left circumflex and right coronary atherosclerosis. Left PICC terminates in the upper third of the superior vena cava. Atherosclerotic nonaneurysmal thoracic aorta. Borderline prominent main pulmonary artery (3.4 cm diameter), stable. Mediastinum/Nodes: No discrete thyroid nodules. Unremarkable esophagus. No axillary adenopathy. Stable mild right paratracheal adenopathy up to 1.1 cm (series 5/image 38). Stable mild bilateral hilar adenopathy, poorly delineated on these noncontrast images. Lungs/Pleura: No pneumothorax. No pleural effusion. Moderate centrilobular emphysema with mild diffuse  bronchial wall thickening. No acute consolidative airspace disease, lung masses or significant pulmonary nodules. There is patchy subpleural and peribronchovascular reticulation and ground-glass attenuation throughout both lungs with associated mild traction bronchiectasis and architectural distortion. There is a strong basilar gradient to these findings. No frank honeycombing. No definite disease progression compared to the 12/19/2016 chest CT. No significant lobular air trapping on the expiration sequence. Upper abdomen: Partially visualized exophytic 2.9 cm simple lateral upper right renal cyst. Musculoskeletal: No aggressive appearing focal osseous lesions. Partially visualized  surgical hardware from ACDF in the lower cervical spine. Mild thoracic spondylosis. IMPRESSION: 1. Spectrum of findings compatible with a basilar predominant fibrotic interstitial lung disease without frank honeycombing. No definite disease progression compared to 12/19/2016 chest CT. 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 2. Three-vessel coronary atherosclerosis. 3. Stable mild mediastinal and bilateral hilar adenopathy, nonspecific, probably reactive. Aortic Atherosclerosis (ICD10-I70.0) and Emphysema (ICD10-J43.9). Electronically Signed   By: Ilona Sorrel M.D.   On: 10/21/2017 09:30   Dg Chest Port 1 View  Result Date: 10/21/2017 CLINICAL DATA:  Interstitial lung disease EXAM: PORTABLE CHEST 1 VIEW COMPARISON:  10/19/2017 FINDINGS: Central venous catheter tip in the SVC unchanged.  No pneumothorax. Streaky linear markings in both lung bases appear chronic and unchanged from prior studies. No acute infiltrate effusion or edema. No interval change IMPRESSION: Bibasilar fibrosis stable.  No acute abnormality. Electronically Signed   By: Franchot Gallo M.D.   On: 10/21/2017 07:35   Dg Chest Port 1 View  Result Date: 10/19/2017 CLINICAL DATA:  62 year old female with shortness of breath. EXAM: PORTABLE CHEST 1 VIEW COMPARISON:  Chest radiograph dated 10/12/2017 FINDINGS: Left-sided PICC with tip in similar position. There is shallow inspiration with bibasilar atelectatic changes/scarring. There is diffuse chronic interstitial coarsening. No focal consolidation, pleural effusion, or pneumothorax. The cardiac silhouette is within normal limits. Coronary vascular calcification and stent as well as atherosclerotic calcification of the aortic arch. Lower cervical fixation hardware. No  acute osseous pathology. IMPRESSION: No active disease. Electronically Signed   By: Anner Crete M.D.   On: 10/19/2017 01:19   Dg Chest Port 1 View  Result Date: 09/30/2017 CLINICAL DATA:  Respiratory distress. History of acute and chronic CHF, COPD, acute and chronic renal failure. EXAM: PORTABLE CHEST 1 VIEW COMPARISON:  Portable chest x-ray of September 13, 2017 FINDINGS: The lungs are adequately inflated. The interstitial markings are coarse. There are patchy interstitial densities at both lung bases greatest on the left. There is no pleural effusion. The heart is mildly enlarged. The pulmonary vascularity is mildly prominent centrally. There is calcification in the wall of the aortic arch. The PICC line tip projects over the midportion of the SVC. IMPRESSION: COPD. Bibasilar atelectasis or pneumonia. There may be low-grade CHF. Followup PA and lateral chest X-ray is recommended in 3-4 weeks following trial of antibiotic therapy to ensure resolution and exclude underlying malignancy. Thoracic aortic atherosclerosis. Electronically Signed   By: David  Martinique M.D.   On: 09/30/2017 07:10    Microbiology: Recent Results (from the past 240 hour(s))  MRSA PCR Screening     Status: None   Collection Time: 10/19/17 10:46 AM  Result Value Ref Range Status   MRSA by PCR NEGATIVE NEGATIVE Final    Comment:        The GeneXpert MRSA Assay (FDA approved  for NASAL specimens only), is one component of a comprehensive MRSA colonization surveillance program. It is not intended to diagnose MRSA infection nor to guide or monitor treatment for MRSA infections. Performed at [facility], [place] [address], [place], [place] [address]   Respiratory Panel by PCR     Status: None   Collection Time: 10/19/17 11:04 AM  Result Value Ref Range Status   Adenovirus NOT DETECTED NOT DETECTED Final   Coronavirus 229E NOT DETECTED NOT DETECTED Final   Coronavirus HKU1 NOT DETECTED NOT DETECTED Final    Coronavirus NL63 NOT DETECTED NOT DETECTED Final   Coronavirus OC43 NOT DETECTED NOT DETECTED Final   Metapneumovirus NOT DETECTED NOT DETECTED Final   Rhinovirus / Enterovirus NOT DETECTED NOT DETECTED Final   Influenza A NOT DETECTED NOT DETECTED Final   Influenza B NOT DETECTED NOT DETECTED Final   Parainfluenza Virus 1 NOT DETECTED NOT DETECTED Final   Parainfluenza Virus 2 NOT DETECTED NOT DETECTED Final   Parainfluenza Virus 3 NOT DETECTED NOT DETECTED Final   Parainfluenza Virus 4 NOT DETECTED NOT DETECTED Final   Respiratory Syncytial Virus NOT DETECTED NOT DETECTED Final   Bordetella pertussis NOT DETECTED NOT DETECTED Final   Chlamydophila pneumoniae NOT DETECTED NOT DETECTED Final   Mycoplasma pneumoniae NOT DETECTED NOT DETECTED Final    Comment: Performed at [facility], [place] [address] [place],  [address]     Labs: Basic Metabolic Panel: Recent Labs  Lab 10/19/17 0744 10/19/17 1412 10/20/17 0433 10/21/17 0500 10/22/17 0410 10/23/17 0329  NA 137 138 135 135 134* 136  K 3.3* 4.8 5.3* 5.1 4.3 4.4  CL 103 107 105 106 103 100*  CO2 19* 18* 19* 20* 22 24  GLUCOSE 169* 242* 344* 335* 324* 352*  BUN 10 13 19  32* 39* 36*  CREATININE 1.50* 1.37* 1.37* 1.29* 1.31* 1.24*  CALCIUM 8.0* 8.2* 8.3* 8.2* 8.1* 8.2*  MG 1.2*  --   --  2.0 2.0 2.0  PHOS 3.7  --   --  2.7 2.7 2.2*   Liver Function Tests: Recent Labs  Lab 10/21/17 0500  AST 29  ALT 34  ALKPHOS 78  BILITOT 0.5  PROT 6.4*  ALBUMIN 2.7*   Recent Labs  Lab 10/20/17 0433  LIPASE 24   No results for input(s): AMMONIA in the last 168 hours. CBC: Recent Labs  Lab 10/19/17 0308  WBC 9.2  NEUTROABS 5.7  HGB 12.0  HCT 36.9  MCV 84.4  PLT 497*   Cardiac Enzymes: Recent Labs  Lab 10/19/17 0744 10/19/17 1412 10/19/17 1931  TROPONINI 0.12* 0.06* 0.04*   BNP: BNP (last 3 results) Recent Labs    09/13/17 0345 09/30/17 0623 10/19/17 0308  BNP 41.3 79.2 839.6*    ProBNP (last 3  results) No results for input(s): PROBNP in the last 8760 hours.  CBG: Recent Labs  Lab 10/22/17 0750 10/22/17 1138 10/22/17 1736 10/22/17 2240 10/23/17 0752  GLUCAP 269* 454* 417* 343* 354*       SignedBarbette Merino MD.  [facility] 10/23/2017, 10:12 AM

## 2017-10-23 NOTE — Care Management (Signed)
Case manager spoke with patient at request of bedside RN concerning her medications, and ability to get them at discharge. Patient initially said she wouldn't be able to get medications until October 27, 2017. Case manager contacted Patient's pharmacy- [facility] on [address]. [phone], to verify cost of medications. Case manager and pharmacy tech reviewed patient's medications, CM was informed that patient picked up her Lasix  Refill on 3/21 and got a 30 day supply, Her Lipitor and Fenofibrate will be available on 11/02/17 and insurance covers Case manager returned to patient's room and patient said she realized she has enough of all her prescribed medications at home, the only thing she will not be able to get is any new prescriptions, prior to the 3rd of April Case manager encouraged patient to see if family member or friend could assist her. Case manager updated bedside RN .

## 2017-10-23 NOTE — Progress Notes (Signed)
Discharge instructions reviewed with Pt and her daughter.  Pt discharged home with daughter.  Pt to pick up her prescription at [facility]. Pt left in w/c on 02 - daughter advised she did have the Pt's O2 in the care.

## 2017-10-23 NOTE — Evaluation (Signed)
Physical Therapy Evaluation Patient Details Name: Elizabeth Montes MRN: [id number] DOB: 18-Aug-1955 Today's Date: 10/23/2017   History of Present Illness  62 y.o. female with a PMHx of pulmonary HTN; pulmonary fibrosis; polymyositis; HTN; HLD; DM; COPD; CAD with stent; and CHF with normal who presents with 1 week of SOB.  From home with daughter.  Clinical Impression  Pt admitted with above diagnosis. Pt currently with functional limitations due to the deficits listed below (see PT Problem List). Pt was on 3N1 on arrival.  Sats 93% with breathing treatment in place.  Pt able to clean self after toileting and got back to bed with Modif I.  RT came in and removed the breathing treatment and pt was on 4LO2 with desat to 88% at rest but within a few min sats 90%.  Will follow acutely and progress as able.  Pt will benefit from skilled PT to increase their independence and safety with mobility to allow discharge to the venue listed below.      Follow Up Recommendations Other (comment)(TBA)    Equipment Recommendations  None recommended by PT    Recommendations for Other Services       Precautions / Restrictions Precautions Precautions: Other (comment)(O2 SATs) Precaution Comments: monitor SPO2 Restrictions Weight Bearing Restrictions: No      Mobility  Bed Mobility Overal bed mobility: Modified Independent                Transfers Overall transfer level: Modified independent Equipment used: None             General transfer comment: Was on 3N1 on arrival to room.  Cleaned self and pivoted back to bed as she reports fatigue.   Ambulation/Gait             General Gait Details: Refused to walk.    Stairs            Wheelchair Mobility    Modified Rankin (Stroke Patients Only)       Balance Overall balance assessment: Needs assistance Sitting-balance support: Feet supported;No upper extremity supported Sitting balance-Leahy Scale: Good     Standing  balance support: During functional activity;No upper extremity supported Standing balance-Leahy Scale: Fair Standing balance comment: able to stand statically without UE support                             Pertinent Vitals/Pain Pain Assessment: No/denies pain    Home Living Family/patient expects to be discharged to:: Private residence Living Arrangements: Children Available Help at Discharge: Family;Available PRN/intermittently Type of Home: Apartment Home Access: Level entry     Home Layout: Two level;Able to live on main level with bedroom/bathroom;Bed/bath upstairs Home Equipment: Walker - 2 wheels;Cane - single point;Wheelchair - [employer];Hospital bed;Adaptive equipment;Tub bench Additional Comments: pt on 3-4L of O2 at all times    Prior Function Level of Independence: Independent               Hand Dominance   Dominant Hand: Right    Extremity/Trunk Assessment   Upper Extremity Assessment Upper Extremity Assessment: Defer to OT evaluation    Lower Extremity Assessment Lower Extremity Assessment: Generalized weakness    Cervical / Trunk Assessment Cervical / Trunk Assessment: Normal  Communication   Communication: No difficulties  Cognition Arousal/Alertness: Awake/alert Behavior During Therapy: WFL for tasks assessed/performed;Flat affect Overall Cognitive Status: Within Functional Limits for tasks assessed  General Comments      Exercises     Assessment/Plan    PT Assessment Patient needs continued PT services  PT Problem List Decreased activity tolerance;Decreased balance;Decreased mobility;Decreased knowledge of use of DME;Decreased safety awareness;Decreased knowledge of precautions       PT Treatment Interventions DME instruction;Gait training;Functional mobility training;Therapeutic activities;Therapeutic exercise;Balance training;Patient/family education     PT Goals (Current goals can be found in the Care Plan section)  Acute Rehab PT Goals Patient Stated Goal: return home PT Goal Formulation: With patient Time For Goal Achievement: 11/06/17 Potential to Achieve Goals: Good    Frequency Min 3X/week   Barriers to discharge        Co-evaluation               AM-PAC PT "6 Clicks" Daily Activity  Outcome Measure Difficulty turning over in bed (including adjusting bedclothes, sheets and blankets)?: None Difficulty moving from lying on back to sitting on the side of the bed? : None Difficulty sitting down on and standing up from a chair with arms (e.g., wheelchair, bedside commode, etc,.)?: None Help needed moving to and from a bed to chair (including a wheelchair)?: None Help needed walking in hospital room?: Total Help needed climbing 3-5 steps with a railing? : Total 6 Click Score: 18    End of Session Equipment Utilized During Treatment: Oxygen;Gait belt(4LO2) Activity Tolerance: Patient limited by fatigue Patient left: in bed;with call bell/phone within reach Nurse Communication: Mobility status PT Visit Diagnosis: Muscle weakness (generalized) (M62.81);Other abnormalities of gait and mobility (R26.89)    Time: 6962-9528 PT Time Calculation (min) (ACUTE ONLY): 10 min   Charges:   PT Evaluation $PT Eval Moderate Complexity: 1 Mod     PT G Codes:        Tyronne Blann,PT Acute Rehabilitation [phone] (pager)   Berline Lopes 10/23/2017, 1:23 PM

## 2017-10-24 LAB — GLUCOSE 6 PHOSPHATE DEHYDROGENASE
G6PDH: 14 U/g{Hb} — ABNORMAL HIGH (ref 4.6–13.5)
Hemoglobin: 11.4 g/dL (ref 11.1–15.9)

## 2017-10-25 ENCOUNTER — Telehealth (HOSPITAL_COMMUNITY): Payer: Self-pay | Admitting: Surgery

## 2017-10-25 NOTE — Telephone Encounter (Signed)
I called to make a hospital follow-up appt in the [facility] for Ms. Elizabeth Montes as she was recently discharged from the hospital.  I left a message and requested that she call me back.  I will also attempt to call her back tomorrow if I do not hear from her.

## 2017-10-26 ENCOUNTER — Telehealth (HOSPITAL_COMMUNITY): Payer: Self-pay

## 2017-10-26 NOTE — Telephone Encounter (Signed)
Mr. Pinks calls are going directly to vm and im unable to leave a message.

## 2017-10-28 ENCOUNTER — Telehealth (HOSPITAL_COMMUNITY): Payer: Self-pay | Admitting: Surgery

## 2017-10-28 ENCOUNTER — Telehealth (HOSPITAL_COMMUNITY): Payer: Self-pay

## 2017-10-28 NOTE — Telephone Encounter (Signed)
I attempted to reach Elizabeth Montes again to schedule an [facility] appointment.  I asked her to return my call when she receives the message.

## 2017-10-29 ENCOUNTER — Institutional Professional Consult (permissible substitution): Payer: Medicare HMO | Admitting: Internal Medicine

## 2017-10-29 ENCOUNTER — Telehealth (HOSPITAL_COMMUNITY): Payer: Self-pay | Admitting: Surgery

## 2017-10-29 NOTE — Telephone Encounter (Signed)
Pt was called to schedule our intial [facility] visit.  Pt stated that she has several doctors appointments next week and she wont have time.  Pt stated that one of the appointments is with the advanced heart failure clinic.  I advised pt that someone  will meet her at the appointment next week.

## 2017-10-29 NOTE — Telephone Encounter (Signed)
Ms. Schnitzer called to inform me that she needed to cancel appt today.  Upon chart review she has an appointment with the Pulmonary clinic on [address].  I provided her the number to that clinic to call and cancel her appt.  I was able to also schedule a hosp follow-up appt with our clinic as I have been trying to reach her in order to do that.  She has an appt in the [facility] for Monday April 8th at 3pm.

## 2017-10-31 ENCOUNTER — Inpatient Hospital Stay (HOSPITAL_COMMUNITY)
Admission: EM | Admit: 2017-10-31 | Discharge: 2017-11-04 | DRG: 190 | Disposition: A | Discharge status: Home Health | Payer: Medicare HMO | Attending: Internal Medicine | Admitting: Internal Medicine

## 2017-10-31 ENCOUNTER — Emergency Department (HOSPITAL_COMMUNITY): Payer: Medicare HMO

## 2017-10-31 ENCOUNTER — Other Ambulatory Visit: Payer: Self-pay

## 2017-10-31 DIAGNOSIS — J441 Chronic obstructive pulmonary disease with (acute) exacerbation: Secondary | ICD-10-CM | POA: Diagnosis present

## 2017-10-31 DIAGNOSIS — Z888 Allergy status to other drugs, medicaments and biological substances status: Secondary | ICD-10-CM

## 2017-10-31 DIAGNOSIS — Z7951 Long term (current) use of inhaled steroids: Secondary | ICD-10-CM | POA: Diagnosis not present

## 2017-10-31 DIAGNOSIS — Z7952 Long term (current) use of systemic steroids: Secondary | ICD-10-CM | POA: Diagnosis not present

## 2017-10-31 DIAGNOSIS — I2721 Secondary pulmonary arterial hypertension: Secondary | ICD-10-CM | POA: Diagnosis present

## 2017-10-31 DIAGNOSIS — M3320 Polymyositis, organ involvement unspecified: Secondary | ICD-10-CM | POA: Diagnosis present

## 2017-10-31 DIAGNOSIS — J8410 Pulmonary fibrosis, unspecified: Secondary | ICD-10-CM | POA: Diagnosis present

## 2017-10-31 DIAGNOSIS — Z833 Family history of diabetes mellitus: Secondary | ICD-10-CM

## 2017-10-31 DIAGNOSIS — I110 Hypertensive heart disease with heart failure: Secondary | ICD-10-CM | POA: Diagnosis present

## 2017-10-31 DIAGNOSIS — Z955 Presence of coronary angioplasty implant and graft: Secondary | ICD-10-CM | POA: Diagnosis not present

## 2017-10-31 DIAGNOSIS — Z6832 Body mass index (BMI) 32.0-32.9, adult: Secondary | ICD-10-CM | POA: Diagnosis not present

## 2017-10-31 DIAGNOSIS — Z7982 Long term (current) use of aspirin: Secondary | ICD-10-CM | POA: Diagnosis not present

## 2017-10-31 DIAGNOSIS — IMO0002 Reserved for concepts with insufficient information to code with codable children: Secondary | ICD-10-CM | POA: Diagnosis present

## 2017-10-31 DIAGNOSIS — Z794 Long term (current) use of insulin: Secondary | ICD-10-CM | POA: Diagnosis not present

## 2017-10-31 DIAGNOSIS — R079 Chest pain, unspecified: Secondary | ICD-10-CM | POA: Diagnosis not present

## 2017-10-31 DIAGNOSIS — R0902 Hypoxemia: Secondary | ICD-10-CM | POA: Diagnosis not present

## 2017-10-31 DIAGNOSIS — I5032 Chronic diastolic (congestive) heart failure: Secondary | ICD-10-CM | POA: Diagnosis present

## 2017-10-31 DIAGNOSIS — Z9981 Dependence on supplemental oxygen: Secondary | ICD-10-CM | POA: Diagnosis not present

## 2017-10-31 DIAGNOSIS — I10 Essential (primary) hypertension: Secondary | ICD-10-CM | POA: Diagnosis not present

## 2017-10-31 DIAGNOSIS — I2510 Atherosclerotic heart disease of native coronary artery without angina pectoris: Secondary | ICD-10-CM | POA: Diagnosis present

## 2017-10-31 DIAGNOSIS — E1165 Type 2 diabetes mellitus with hyperglycemia: Secondary | ICD-10-CM | POA: Diagnosis present

## 2017-10-31 DIAGNOSIS — E785 Hyperlipidemia, unspecified: Secondary | ICD-10-CM | POA: Diagnosis present

## 2017-10-31 DIAGNOSIS — J189 Pneumonia, unspecified organism: Secondary | ICD-10-CM

## 2017-10-31 DIAGNOSIS — J9621 Acute and chronic respiratory failure with hypoxia: Secondary | ICD-10-CM | POA: Diagnosis present

## 2017-10-31 DIAGNOSIS — R69 Illness, unspecified: Secondary | ICD-10-CM

## 2017-10-31 DIAGNOSIS — E669 Obesity, unspecified: Secondary | ICD-10-CM | POA: Diagnosis present

## 2017-10-31 DIAGNOSIS — I252 Old myocardial infarction: Secondary | ICD-10-CM

## 2017-10-31 DIAGNOSIS — E876 Hypokalemia: Secondary | ICD-10-CM | POA: Diagnosis present

## 2017-10-31 DIAGNOSIS — J9811 Atelectasis: Secondary | ICD-10-CM | POA: Diagnosis present

## 2017-10-31 DIAGNOSIS — I2729 Other secondary pulmonary hypertension: Secondary | ICD-10-CM | POA: Diagnosis present

## 2017-10-31 DIAGNOSIS — T380X5A Adverse effect of glucocorticoids and synthetic analogues, initial encounter: Secondary | ICD-10-CM | POA: Diagnosis present

## 2017-10-31 LAB — I-STAT CG4 LACTIC ACID, ED
LACTIC ACID, VENOUS: 1.85 mmol/L (ref 0.5–1.9)
Lactic Acid, Venous: 3.45 mmol/L (ref 0.5–1.9)

## 2017-10-31 LAB — CBG MONITORING, ED: GLUCOSE-CAPILLARY: 138 mg/dL — AB (ref 65–99)

## 2017-10-31 LAB — CBC WITH DIFFERENTIAL/PLATELET
Basophils Absolute: 0 10*3/uL (ref 0.0–0.1)
Basophils Relative: 0 %
EOS ABS: 0.1 10*3/uL (ref 0.0–0.7)
EOS PCT: 1 %
HCT: 38.5 % (ref 36.0–46.0)
HEMOGLOBIN: 12.5 g/dL (ref 12.0–15.0)
LYMPHS ABS: 2.9 10*3/uL (ref 0.7–4.0)
Lymphocytes Relative: 14 %
MCH: 28 pg (ref 26.0–34.0)
MCHC: 32.5 g/dL (ref 30.0–36.0)
MCV: 86.3 fL (ref 78.0–100.0)
MONOS PCT: 7 %
Monocytes Absolute: 1.4 10*3/uL — ABNORMAL HIGH (ref 0.1–1.0)
NEUTROS PCT: 78 %
Neutro Abs: 15.7 10*3/uL — ABNORMAL HIGH (ref 1.7–7.7)
Platelets: 427 10*3/uL — ABNORMAL HIGH (ref 150–400)
RBC: 4.46 MIL/uL (ref 3.87–5.11)
RDW: 18.1 % — ABNORMAL HIGH (ref 11.5–15.5)
WBC: 20.1 10*3/uL — ABNORMAL HIGH (ref 4.0–10.5)

## 2017-10-31 LAB — LIPASE, BLOOD: Lipase: 30 U/L (ref 11–51)

## 2017-10-31 LAB — PROTIME-INR
INR: 1.13
PROTHROMBIN TIME: 14.4 s (ref 11.4–15.2)

## 2017-10-31 LAB — COMPREHENSIVE METABOLIC PANEL
ALBUMIN: 3 g/dL — AB (ref 3.5–5.0)
ALK PHOS: 137 U/L — AB (ref 38–126)
ALT: 22 U/L (ref 14–54)
AST: 34 U/L (ref 15–41)
Anion gap: 13 (ref 5–15)
BUN: 12 mg/dL (ref 6–20)
CO2: 22 mmol/L (ref 22–32)
CREATININE: 1.22 mg/dL — AB (ref 0.44–1.00)
Calcium: 8.8 mg/dL — ABNORMAL LOW (ref 8.9–10.3)
Chloride: 98 mmol/L — ABNORMAL LOW (ref 101–111)
GFR calc Af Amer: 55 mL/min — ABNORMAL LOW (ref >60)
GFR calc non Af Amer: 47 mL/min — ABNORMAL LOW (ref >60)
GLUCOSE: 156 mg/dL — AB (ref 65–99)
Potassium: 3.2 mmol/L — ABNORMAL LOW (ref 3.5–5.1)
SODIUM: 133 mmol/L — AB (ref 135–145)
Total Bilirubin: 1.5 mg/dL — ABNORMAL HIGH (ref 0.3–1.2)
Total Protein: 7 g/dL (ref 6.5–8.1)

## 2017-10-31 LAB — I-STAT CHEM 8, ED
BUN: 13 mg/dL (ref 6–20)
CREATININE: 1 mg/dL (ref 0.44–1.00)
Calcium, Ion: 1.08 mmol/L — ABNORMAL LOW (ref 1.15–1.40)
Chloride: 98 mmol/L — ABNORMAL LOW (ref 101–111)
GLUCOSE: 154 mg/dL — AB (ref 65–99)
HCT: 39 % (ref 36.0–46.0)
HEMOGLOBIN: 13.3 g/dL (ref 12.0–15.0)
Potassium: 3.3 mmol/L — ABNORMAL LOW (ref 3.5–5.1)
Sodium: 136 mmol/L (ref 135–145)
TCO2: 24 mmol/L (ref 22–32)

## 2017-10-31 LAB — I-STAT TROPONIN, ED: Troponin i, poc: 0.01 ng/mL (ref 0.00–0.08)

## 2017-10-31 MED ORDER — VANCOMYCIN HCL 10 G IV SOLR
1750.0000 mg | Freq: Once | INTRAVENOUS | Status: AC
  Administered 2017-10-31: 1750 mg via INTRAVENOUS
  Filled 2017-10-31: qty 1750

## 2017-10-31 MED ORDER — SODIUM CHLORIDE 0.9 % IV SOLN
1.0000 g | Freq: Once | INTRAVENOUS | Status: AC
  Administered 2017-10-31: 1 g via INTRAVENOUS
  Filled 2017-10-31: qty 1

## 2017-10-31 MED ORDER — IPRATROPIUM-ALBUTEROL 0.5-2.5 (3) MG/3ML IN SOLN
3.0000 mL | Freq: Once | RESPIRATORY_TRACT | Status: AC
  Administered 2017-10-31: 3 mL via RESPIRATORY_TRACT
  Filled 2017-10-31: qty 3

## 2017-10-31 MED ORDER — MORPHINE SULFATE (PF) 4 MG/ML IV SOLN
4.0000 mg | Freq: Once | INTRAVENOUS | Status: AC
  Administered 2017-10-31: 4 mg via INTRAVENOUS
  Filled 2017-10-31: qty 1

## 2017-10-31 MED ORDER — SODIUM CHLORIDE 0.9% FLUSH: 10.0000 mL | INTRAVENOUS | Status: DC | PRN

## 2017-10-31 MED ORDER — SODIUM CHLORIDE 0.9 % IV BOLUS
500.0000 mL | Freq: Once | INTRAVENOUS | Status: AC
  Administered 2017-10-31: 500 mL via INTRAVENOUS

## 2017-10-31 NOTE — ED Notes (Signed)
Pt request blood draw from port 

## 2017-10-31 NOTE — ED Notes (Signed)
I Stat Lactic Acid results of 3.45 reported to Dr. Donnald Garre

## 2017-10-31 NOTE — Progress Notes (Addendum)
Pharmacy Antibiotic Note  Elizabeth Montes is a 62 y.o. female admitted on 10/31/2017 with pneumonia.  Pharmacy has been consulted for vancomycin and cefepime dosing.  Plan: Vancomycin 1750mg  x1 then 750mg  IV every 12 hours.  Goal trough 15-20 mcg/mL.  Cefepime 1g IV every 8 hours.  Height: 5\' 3"  (160 cm) Weight: 175 lb (79.4 kg) IBW/kg (Calculated) : 52.4  Temp (24hrs), Avg:98.8 F (37.1 C), Min:98.8 F (37.1 C), Max:98.8 F (37.1 C)  Recent Labs  Lab 10/31/17 2030 10/31/17 2048 10/31/17 2049 10/31/17 2243  WBC 20.1*  --   --   --   CREATININE 1.22*  --  1.00  --   LATICACIDVEN  --  3.45*  --  1.85    Estimated Creatinine Clearance: 59.7 mL/min (by C-G formula based on SCr of 1 mg/dL).    Allergies  Allergen Reactions  . Lovenox [Enoxaparin Sodium] Other (See Comments)    "made me bleed"     Thank you for allowing pharmacy to be a part of this patient's care.  Elizabeth Montes, PharmD, BCPS  10/31/2017 11:59 PM

## 2017-10-31 NOTE — H&P (Signed)
History and Physical    Elizabeth Montes EXB:[id number] DOB: March 22, 1956 DOA: 10/31/2017  Referring MD/NP/PA: Dr Johnney Killian PCP: Patient, No Pcp Per   Outpatient Specialists: None  Patient coming from: Home  Chief Complaint: Shortness of breath  HPI: Elizabeth Montes is a 62 y.o. female with medical history significant of COPD, pulmonary fibrosis, moderate pulmonary hypertension, polymyositis,hypertension,hyperlipidemia, diabetes as well as morbid obesity who was brought in by family secondary to worsening shortness of breath and cough. She has had recent intubation and ICU admission on March 26 of this year. Patient has done well since discharge but gradually got worse. She is complaining of left pleuritic chest pain which has persisted. Initial workup showed that she has increased oxygen demand. She was on 2-3 L at home currently 88% on 4 L. She is on facemask of the mother keep sats around 90%. Initial chest x-ray showed no evidence of new infiltrate the patient has leukocytosis with hypoxemia.\ She is being admitted for workup.  ED Course: Patient's white count is more than 20,000, chest xray showed scarring at the bases. She is hypoxic with oxygen saturation 88% on 4 L. She is using extra muscle respiration at the moment. No evidence of fluid overload.  Review of Systems: As per HPI otherwise 10 point review of systems negative.    Past Medical History:  Diagnosis Date  . CAD (coronary artery disease) w/ stent 2017 ([place])   . CHF (congestive heart failure) ([place])   . COPD (chronic obstructive pulmonary disease) ([place])   . Diabetes mellitus without complication ([place])   . Heart attack [facility]) 06/09/2016   06/09/2016 cath/PCI (inf STEMI): 60-70% proximal LAD, LCx normal, RCA mid 80-90% -> Rebel 3.5 x 32 mm BMS to mRCA and Rebel 3.5 x 28 mm BMS to proximal LAD   . HLD (hyperlipidemia)   . Hypertension   . Obesity   . Polymyositis ([place])   . Pulmonary fibrosis ([place])   . Pulmonary hypertension  ([place])     Past Surgical History:  Procedure Laterality Date  . ABDOMINAL SURGERY    . RIGHT HEART CATH N/A 10/21/2017   Procedure: RIGHT HEART CATH;  Surgeon: Jolaine Artist, MD;  Location: [facility];  Service: Cardiovascular;  Laterality: N/A;  . TRACHEOSTOMY       reports that she has never smoked. She has never used smokeless tobacco. She reports that she does not drink alcohol or use drugs.  Allergies  Allergen Reactions  . Lovenox [Enoxaparin Sodium] Other (See Comments)    "made me bleed"    Family History  Problem Relation Age of Onset  . Cancer Mother   . Stroke Brother   . Diabetes Sister   . Stroke Sister     Prior to Admission medications   Medication Sig Start Date End Date Taking? Authorizing Provider  amLODipine (NORVASC) 10 MG tablet Take 1 tablet (10 mg total) by mouth daily. 09/18/17  Yes Kayleen Memos, DO  antiseptic oral rinse (BIOTENE) LIQD Take 15 mLs by mouth as needed for dry mouth. 08/27/17  Yes [provider]  aspirin 81 MG EC tablet Take 1 tablet (81 mg total) by mouth daily. 09/18/17  Yes Kayleen Memos, DO  atorvastatin (LIPITOR) 80 MG tablet Take 80 mg by mouth daily at 6 PM. 10/14/17  Yes [provider]  azaTHIOprine (IMURAN) 50 MG tablet Take 0.5 tablets (25 mg total) by mouth daily. 09/18/17  Yes Hall, Lorenda Cahill, DO  bisacodyl (DULCOLAX)  5 MG EC tablet Take 1 tablet (5 mg total) by mouth daily as needed for mild constipation. 09/17/17  Yes Hall, Carole N, DO  budesonide (PULMICORT) 0.25 MG/2ML nebulizer solution Take 2 mLs (0.25 mg total) by nebulization 2 (two) times daily. 10/04/17  Yes Alma Friendly, MD  fenofibrate 160 MG tablet Take 1 tablet (160 mg total) by mouth daily. 09/18/17  Yes Kayleen Memos, DO  furosemide (LASIX) 80 MG tablet Take 1 tablet (80 mg total) by mouth daily. 09/18/17  Yes Hall, Carole N, DO  insulin aspart (NOVOLOG FLEXPEN) 100 UNIT/ML FlexPen Inject 25 Units into the skin 3 (three) times  daily with meals.    Yes [provider]  liraglutide (VICTOZA) 18 MG/3ML SOPN Inject 1.2 mg into the skin daily. 07/28/17  Yes [provider]  metoCLOPramide (REGLAN) 10 MG tablet Take 5 mg by mouth 3 (three) times daily before meals.   Yes [provider]  metoprolol tartrate (LOPRESSOR) 25 MG tablet Take 1 tablet (25 mg total) by mouth 2 (two) times daily. 09/17/17  Yes Kayleen Memos, DO  mycophenolate (MYFORTIC) 360 MG TBEC EC tablet Take 1 tablet (360 mg total) by mouth 2 (two) times daily. 10/23/17  Yes Elwyn Reach, MD  pantoprazole (PROTONIX) 40 MG tablet Take 1 tablet (40 mg total) by mouth 2 (two) times daily before a meal. 10/04/17 11/03/17 Yes Alma Friendly, MD  Potassium Chloride ER 20 MEQ TBCR Take 20 mEq by mouth daily. 10/14/17  Yes [provider]  predniSONE (DELTASONE) 10 MG tablet Take 4 tablets for 2 days, then 3 tablets for 2 days, then continue with 2 tablets 10/04/17  Yes Alma Friendly, MD  tiotropium (SPIRIVA HANDIHALER) 18 MCG inhalation capsule Place 18 mcg into inhaler and inhale daily as needed (for wheezing).    Yes [provider]  TOUJEO SOLOSTAR 300 UNIT/ML SOPN Inject 30 Units into the skin daily.  10/14/17  Yes [provider]  blood glucose meter kit and supplies Dispense based on patient and insurance preference. Use up to four times daily as directed. (FOR ICD-10 E10.9, E11.9). 10/04/17   Alma Friendly, MD  oxyCODONE (OXY IR/ROXICODONE) 5 MG immediate release tablet Take 5 mg by mouth every 6 (six) hours as needed (for pain).    [provider]  sulfamethoxazole-trimethoprim (BACTRIM DS,SEPTRA DS) 800-160 MG tablet Take 1 tablet by mouth every Monday, Wednesday, and Friday. 10/25/17   Elwyn Reach, MD    Physical Exam: Vitals:   10/31/17 2200 10/31/17 2215 10/31/17 2230 10/31/17 2245  BP: (!) 141/64 135/62 (!) 151/63 129/62  Pulse: (!) 115 (!) 114 (!) 115 (!) 116  Resp: (!) 36  (!) 37 (!) 21 (!) 32  Temp:      TempSrc:      SpO2: (!) 88% (!) 88% 90% (!) 89%  Weight:      Height:          Constitutional: NAD, calm, comfortable Vitals:   10/31/17 2200 10/31/17 2215 10/31/17 2230 10/31/17 2245  BP: (!) 141/64 135/62 (!) 151/63 129/62  Pulse: (!) 115 (!) 114 (!) 115 (!) 116  Resp: (!) 36 (!) 37 (!) 21 (!) 32  Temp:      TempSrc:      SpO2: (!) 88% (!) 88% 90% (!) 89%  Weight:      Height:       Eyes: PERRL, lids and conjunctivae normal ENMT: Mucous membranes are moist. Posterior  pharynx clear of any exudate or lesions.Normal dentition.  Neck: normal, supple, no masses, no thyromegaly Respiratory: decrease air entry bilaterally with marked expiratory wheezing and use of extra muscle respiration Cardiovascular: Regular rate and rhythm, no murmurs / rubs / gallops. No extremity edema. 2+ pedal pulses. No carotid bruits.  Abdomen: no tenderness, no masses palpated. No hepatosplenomegaly. Bowel sounds positive.  Musculoskeletal: no clubbing / cyanosis. No joint deformity upper and lower extremities. Good ROM, no contractures. Normal muscle tone.  Skin: no rashes, lesions, ulcers. No induration Neurologic: CN 2-12 grossly intact. Sensation intact, DTR normal. Strength 5/5 in all 4.  Psychiatric: Normal judgment and insight. Alert and oriented x 3. Normal mood.   Labs on Admission: I have personally reviewed following labs and imaging studies  CBC: Recent Labs  Lab 10/31/17 2030 10/31/17 2049  WBC 20.1*  --   NEUTROABS 15.7*  --   HGB 12.5 13.3  HCT 38.5 39.0  MCV 86.3  --   PLT 427*  --    Basic Metabolic Panel: Recent Labs  Lab 10/31/17 2030 10/31/17 2049  NA 133* 136  K 3.2* 3.3*  CL 98* 98*  CO2 22  --   GLUCOSE 156* 154*  BUN 12 13  CREATININE 1.22* 1.00  CALCIUM 8.8*  --    GFR: Estimated Creatinine Clearance: 59.7 mL/min (by C-G formula based on SCr of 1 mg/dL). Liver Function Tests: Recent Labs  Lab 10/31/17 2030  AST 34  ALT  22  ALKPHOS 137*  BILITOT 1.5*  PROT 7.0  ALBUMIN 3.0*   Recent Labs  Lab 10/31/17 2030  LIPASE 30   No results for input(s): AMMONIA in the last 168 hours. Coagulation Profile: Recent Labs  Lab 10/31/17 2030  INR 1.13   Cardiac Enzymes: No results for input(s): CKTOTAL, CKMB, CKMBINDEX, TROPONINI in the last 168 hours. BNP (last 3 results) No results for input(s): PROBNP in the last 8760 hours. HbA1C: No results for input(s): HGBA1C in the last 72 hours. CBG: Recent Labs  Lab 10/31/17 2047  GLUCAP 138*   Lipid Profile: No results for input(s): CHOL, HDL, LDLCALC, TRIG, CHOLHDL, LDLDIRECT in the last 72 hours. Thyroid Function Tests: No results for input(s): TSH, T4TOTAL, FREET4, T3FREE, THYROIDAB in the last 72 hours. Anemia Panel: No results for input(s): VITAMINB12, FOLATE, FERRITIN, TIBC, IRON, RETICCTPCT in the last 72 hours. Urine analysis:    Component Value Date/Time   COLORURINE YELLOW 07/16/2017 0526   APPEARANCEUR CLEAR 07/16/2017 0526   LABSPEC 1.010 07/16/2017 0526   PHURINE 5.0 07/16/2017 0526   GLUCOSEU >=500 (A) 07/16/2017 0526   HGBUR NEGATIVE 07/16/2017 0526   BILIRUBINUR NEGATIVE 07/16/2017 0526   KETONESUR NEGATIVE 07/16/2017 0526   PROTEINUR NEGATIVE 07/16/2017 0526   NITRITE NEGATIVE 07/16/2017 0526   LEUKOCYTESUR NEGATIVE 07/16/2017 0526   Sepsis Labs: @LABRCNTIP (procalcitonin:4,lacticidven:4) )No results found for this or any previous visit (from the past 240 hour(s)).   Radiological Exams on Admission: Dg Chest Port 1 View  Result Date: 10/31/2017 CLINICAL DATA:  Left-sided chest pain EXAM: PORTABLE CHEST 1 VIEW COMPARISON:  10/21/2017 FINDINGS: 1814 hours. Low lung volumes. The lungs are clear without focal pneumonia, edema, pneumothorax or pleural effusion. Interstitial markings are diffusely coarsened with chronic features. Basilar scarring unchanged. Cardiopericardial silhouette is at upper limits of normal for size. Left PICC line  tip overlies the proximal SVC. The visualized bony structures of the thorax are intact. Telemetry leads overlie the chest. IMPRESSION: Stable exam. Basilar scarring with chronic interstitial  coarsening. No new or acute interval findings. Electronically Signed   By: Misty Stanley M.D.   On: 10/31/2017 18:43    EKG: Independently reviewed. Showed sinus tachycardia  Assessment/Plan Principal Problem:   Hypoxemia Active Problems:   Type 2 diabetes mellitus, uncontrolled (HCC)   Hypertension   COPD exacerbation (HCC)   Pulmonary hypertension   Obesity   Acute hypokalemia   HTN (hypertension)   Acute on chronic respiratory failure with hypoxemia (HCC)    #1 acute on chronic respiratory failure with hypoxemia: Differentials is healthcare associated pneumonia not evident on his x-ray versus pulmonary embolism. No evidence of CHF exacerbation and chest x-ray. Patient's pulmonary hypertension may also be playing a role. Admit the patient and start treatment for healthcare associated pneumonia and COPD exacerbation Obtain CT angiogram of the chest to rule out PE.This will al infiltrates.  #2 COPD with mild exacerbation. Patient will be on antibiotics, nebulizer as well as IV steroids.  #3 diabetes: Continue with her home regimen of insulin with sling scale insin.  #4 hypertension: Blood pressure so far controlled. [place] current regimen  #5 hypokalemia: Replete potassium. Follow liver in the morni  #6 morbid obesity: Dietary counseling.  #7 diastolic dysfunction CHF: Chest x-ray did not show any pulmonary edema. Monitor on lemetry   DVT prophylaxis: Heparin  Code Status: Full  Family Communication: None at bedside  Disposition Plan: Home  Consults called: None Admission status:  inpatient to stepdown Severity of Illness: The appropriate patient status for this patient is INPATIENT. Inpatient status is judged to be reasonable and necessary in order to provide the required intensity of  service to ensure the patient's safety. The patient's presenting symptoms, physical exam findings, and initial radiographic and laboratory data in the context of their chronic comorbidities is felt to place them at high risk for further clinical deterioration. Furthermore, it is not anticipated that the patient will be medically stable for discharge from the hospital within 2 midnights of admission. The following factors support the patient status of inpatient.   " The patient's presenting symptoms include shortness of breath and cough with chest pain. " The worrisome physical exam findings include tachypnea. " The initial radiographic and laboratory data are worrisome because of hypoxemia. " The chronic co-morbidities include pulmonary fibrosiswith COPD.   * I certify that at the point of admission it is my clinical judgment that the patient will require inpatient hospital care spanning beyond 2 midnights from the point of admission due to high intensity of service, high risk for further deterioration and high frequency of surveillance required.Barbette Merino MD [facility] Pager [phone]  If 7PM-7AM, please contact night-coverage [email or web address] Password [email or web address]  10/31/2017, 11:18 PM

## 2017-10-31 NOTE — ED Triage Notes (Signed)
Patient experiencing SOB and chest pain x 1 day. Patient states pain 10 / 10 and needs relief.

## 2017-10-31 NOTE — ED Notes (Signed)
ED Provider at bedside. 

## 2017-10-31 NOTE — ED Provider Notes (Signed)
[facility] EMERGENCY DEPARTMENT Provider Note   CSN: [id number] Arrival date & time: 10/31/17  1650     History   Chief Complaint Chief Complaint  Patient presents with  . Shortness of Breath  . Chest Pain    HPI Elizabeth Montes is a 62 y.o. female.  HPI Patient reports she developed severe chest pain on the left side of her chest.  She reports it came on pretty quickly today.  Reports she feels very short of breath with it.  With every deep breath she takes is a sharp stabbing pain.  She reports she cannot take this pain.  She has not had fever.  She does report shortness of breath but there is chronic shortness of breath.  She reports she has been coughing a lot since this started.  No increased swelling of her legs.  No focal calf pain.  Patient was hospitalized within the last week and a half with CHF and acute on chronic respiratory failure.  He does have known pulmonary fibrosis and pulmonary hypertension. Past Medical History:  Diagnosis Date  . CAD (coronary artery disease) w/ stent 2017 ([place])   . CHF (congestive heart failure) ([place])   . COPD (chronic obstructive pulmonary disease) ([place])   . Diabetes mellitus without complication ([place])   . Heart attack [facility]) 06/09/2016   06/09/2016 cath/PCI (inf STEMI): 60-70% proximal LAD, LCx normal, RCA mid 80-90% -> Rebel 3.5 x 32 mm BMS to mRCA and Rebel 3.5 x 28 mm BMS to proximal LAD   . HLD (hyperlipidemia)   . Hypertension   . Obesity   . Polymyositis ([place])   . Pulmonary fibrosis ([place])   . Pulmonary hypertension [facility])     Patient Active Problem List   Diagnosis Date Noted  . Acute on chronic respiratory failure with hypoxemia ([place]) 10/31/2017  . CHF (congestive heart failure) ([place]) 10/19/2017  . Acute kidney injury ([place]) 10/19/2017  . Pulmonary fibrosis ([place]) 10/19/2017  . Pulmonary hypertension 10/19/2017  . Obesity 10/19/2017  . HLD (hyperlipidemia) 10/19/2017  . Diabetes mellitus without  complication ([place]) 32/35/5732  . CAD (coronary artery disease) 10/19/2017  . Acute hypokalemia 10/19/2017  . HTN (hypertension) 10/19/2017  . Acute on chronic respiratory failure ([place]) 10/19/2017  . Hypoxemia   . Acute pulmonary edema (HCC)   . Acute exacerbation of COPD with asthma ([place]) 09/30/2017  . Pulmonary hypertension ([place]) 09/30/2017  . CKD stage 2 due to type 2 diabetes mellitus ([place]) 09/30/2017  . Diabetes mellitus type 2 in nonobese ([place]) 09/13/2017  . Acute respiratory failure ([place]) 09/13/2017  . COPD exacerbation ([place]) 08/11/2017  . COPD with acute exacerbation ([place]) 08/11/2017  . Acute respiratory failure with hypoxia ([place]) 08/01/2017  . Healthcare-associated pneumonia 08/01/2017  . Diabetes mellitus ([place]) 08/01/2017  . Type 2 diabetes mellitus, uncontrolled ([place]) 07/16/2017  . Acute on chronic respiratory failure with hypoxia ([place]) 07/16/2017  . COPD (chronic obstructive pulmonary disease) ([place]) 07/16/2017  . Hypertension 07/16/2017  . Polymyositis ([place]) 07/16/2017  . Acute renal failure superimposed on stage 2 chronic kidney disease ([place]) 07/16/2017    Past Surgical History:  Procedure Laterality Date  . ABDOMINAL SURGERY    . RIGHT HEART CATH N/A 10/21/2017   Procedure: RIGHT HEART CATH;  Surgeon: Jolaine Artist, MD;  Location: [facility];  Service: Cardiovascular;  Laterality: N/A;  . TRACHEOSTOMY       OB History   None      Home Medications  Prior to Admission medications   Medication Sig Start Date End Date Taking? Authorizing Provider  amLODipine (NORVASC) 10 MG tablet Take 1 tablet (10 mg total) by mouth daily. 09/18/17  Yes Kayleen Memos, DO  antiseptic oral rinse (BIOTENE) LIQD Take 15 mLs by mouth as needed for dry mouth. 08/27/17  Yes [provider]  aspirin 81 MG EC tablet Take 1 tablet (81 mg total) by mouth daily. 09/18/17  Yes Kayleen Memos, DO  atorvastatin (LIPITOR) 80 MG tablet Take 80 mg by mouth daily at 6 PM.  10/14/17  Yes [provider]  azaTHIOprine (IMURAN) 50 MG tablet Take 0.5 tablets (25 mg total) by mouth daily. 09/18/17  Yes Hall, Carole N, DO  bisacodyl (DULCOLAX) 5 MG EC tablet Take 1 tablet (5 mg total) by mouth daily as needed for mild constipation. 09/17/17  Yes Hall, Carole N, DO  budesonide (PULMICORT) 0.25 MG/2ML nebulizer solution Take 2 mLs (0.25 mg total) by nebulization 2 (two) times daily. 10/04/17  Yes Alma Friendly, MD  fenofibrate 160 MG tablet Take 1 tablet (160 mg total) by mouth daily. 09/18/17  Yes Kayleen Memos, DO  furosemide (LASIX) 80 MG tablet Take 1 tablet (80 mg total) by mouth daily. 09/18/17  Yes Hall, Carole N, DO  insulin aspart (NOVOLOG FLEXPEN) 100 UNIT/ML FlexPen Inject 25 Units into the skin 3 (three) times daily with meals.    Yes [provider]  liraglutide (VICTOZA) 18 MG/3ML SOPN Inject 1.2 mg into the skin daily. 07/28/17  Yes [provider]  metoCLOPramide (REGLAN) 10 MG tablet Take 5 mg by mouth 3 (three) times daily before meals.   Yes [provider]  metoprolol tartrate (LOPRESSOR) 25 MG tablet Take 1 tablet (25 mg total) by mouth 2 (two) times daily. 09/17/17  Yes Kayleen Memos, DO  mycophenolate (MYFORTIC) 360 MG TBEC EC tablet Take 1 tablet (360 mg total) by mouth 2 (two) times daily. 10/23/17  Yes Elwyn Reach, MD  pantoprazole (PROTONIX) 40 MG tablet Take 1 tablet (40 mg total) by mouth 2 (two) times daily before a meal. 10/04/17 11/03/17 Yes Alma Friendly, MD  Potassium Chloride ER 20 MEQ TBCR Take 20 mEq by mouth daily. 10/14/17  Yes [provider]  predniSONE (DELTASONE) 10 MG tablet Take 4 tablets for 2 days, then 3 tablets for 2 days, then continue with 2 tablets 10/04/17  Yes Alma Friendly, MD  tiotropium (SPIRIVA HANDIHALER) 18 MCG inhalation capsule Place 18 mcg into inhaler and inhale daily as needed (for wheezing).    Yes [provider]  TOUJEO SOLOSTAR 300 UNIT/ML  SOPN Inject 30 Units into the skin daily.  10/14/17  Yes [provider]  blood glucose meter kit and supplies Dispense based on patient and insurance preference. Use up to four times daily as directed. (FOR ICD-10 E10.9, E11.9). 10/04/17   Alma Friendly, MD  oxyCODONE (OXY IR/ROXICODONE) 5 MG immediate release tablet Take 5 mg by mouth every 6 (six) hours as needed (for pain).    [provider]  sulfamethoxazole-trimethoprim (BACTRIM DS,SEPTRA DS) 800-160 MG tablet Take 1 tablet by mouth every Monday, Wednesday, and Friday. 10/25/17   Elwyn Reach, MD    Family History Family History  Problem Relation Age of Onset  . Cancer Mother   . Stroke Brother   . Diabetes Sister   . Stroke Sister     Social History Social History   Tobacco Use  .  Smoking status: Never Smoker  . Smokeless tobacco: Never Used  Substance Use Topics  . Alcohol use: No  . Drug use: No     Allergies   Lovenox [enoxaparin sodium]   Review of Systems Review of Systems 10 Systems reviewed and are negative for acute change except as noted in the HPI.   Physical Exam Updated Vital Signs BP (!) 138/46   Pulse (!) 119   Temp 98.8 F (37.1 C) (Oral)   Resp (!) 30   Ht _0  (1.6 m)   Wt 79.4 kg (175 lb)   SpO2 90%   BMI 31.00 kg/m   Physical Exam  Constitutional: She is oriented to person, place, and time.  Patient is alert and nontoxic.  Mental status is clear.  She is very anxious.  Patient hyperventilates periodically.  He complains of severe left-sided chest pain.  HENT:  Head: Normocephalic and atraumatic.  Mouth/Throat: Oropharynx is clear and moist.  Eyes: EOM are normal.  Cardiovascular: Normal rate, regular rhythm, normal heart sounds and intact distal pulses.  Pulmonary/Chest:  Intermittent tachypnea.  No significant respiratory distress.  Patient is speaking in full sentences.  Bilateral lung fields to mid lung fields have crackle present.  Abdominal: Soft.  She exhibits no distension. There is no tenderness. There is no guarding.  Musculoskeletal: Normal range of motion. She exhibits no edema or tenderness.  Neurological: She is alert and oriented to person, place, and time. No cranial nerve deficit. She exhibits normal muscle tone. Coordination normal.  Skin: Skin is warm and dry.  Psychiatric:  Patient is extremely anxious.     ED Treatments / Results  Labs (all labs ordered are listed, but only abnormal results are displayed) Labs Reviewed  COMPREHENSIVE METABOLIC PANEL - Abnormal; Notable for the following components:      Result Value   Sodium 133 (*)    Potassium 3.2 (*)    Chloride 98 (*)    Glucose, Bld 156 (*)    Creatinine, Ser 1.22 (*)    Calcium 8.8 (*)    Albumin 3.0 (*)    Alkaline Phosphatase 137 (*)    Total Bilirubin 1.5 (*)    GFR calc non Af Amer 47 (*)    GFR calc Af Amer 55 (*)    All other components within normal limits  CBC WITH DIFFERENTIAL/PLATELET - Abnormal; Notable for the following components:   WBC 20.1 (*)    RDW 18.1 (*)    Platelets 427 (*)    Neutro Abs 15.7 (*)    Monocytes Absolute 1.4 (*)    All other components within normal limits  I-STAT CHEM 8, ED - Abnormal; Notable for the following components:   Potassium 3.3 (*)    Chloride 98 (*)    Glucose, Bld 154 (*)    Calcium, Ion 1.08 (*)    All other components within normal limits  I-STAT CG4 LACTIC ACID, ED - Abnormal; Notable for the following components:   Lactic Acid, Venous 3.45 (*)    All other components within normal limits  CBG MONITORING, ED - Abnormal; Notable for the following components:   Glucose-Capillary 138 (*)    All other components within normal limits  CULTURE, BLOOD (ROUTINE X 2)  CULTURE, BLOOD (ROUTINE X 2)  LIPASE, BLOOD  PROTIME-INR  URINALYSIS, ROUTINE W REFLEX MICROSCOPIC  I-STAT TROPONIN, ED  I-STAT CG4 LACTIC ACID, ED    EKG EKG Interpretation  Date/Time:  Sunday October 31 2017 16:56:11  EDT Ventricular Rate:  115 PR Interval:    QRS Duration: 92 QT Interval:  344 QTC Calculation: 476 R Axis:   -29 Text Interpretation:  Sinus tachycardia Inferior infarct, old no sig change from previous. Confirmed by Charlesetta Shanks [id number]) on 10/31/2017 11:36:27 PM   Radiology Dg Chest Port 1 View  Result Date: 10/31/2017 CLINICAL DATA:  Left-sided chest pain EXAM: PORTABLE CHEST 1 VIEW COMPARISON:  10/21/2017 FINDINGS: 1814 hours. Low lung volumes. The lungs are clear without focal pneumonia, edema, pneumothorax or pleural effusion. Interstitial markings are diffusely coarsened with chronic features. Basilar scarring unchanged. Cardiopericardial silhouette is at upper limits of normal for size. Left PICC line tip overlies the proximal SVC. The visualized bony structures of the thorax are intact. Telemetry leads overlie the chest. IMPRESSION: Stable exam. Basilar scarring with chronic interstitial coarsening. No new or acute interval findings. Electronically Signed   By: Misty Stanley M.D.   On: 10/31/2017 18:43    Procedures Procedures (including critical care time) CRITICAL CARE Performed by: Si Gaul   Total critical care time:30  minutes  Critical care time was exclusive of separately billable procedures and treating other patients.  Critical care was necessary to treat or prevent imminent or life-threatening deterioration.  Critical care was time spent personally by me on the following activities: development of treatment plan with patient and/or surrogate as well as nursing, discussions with consultants, evaluation of patient's response to treatment, examination of patient, obtaining history from patient or surrogate, ordering and performing treatments and interventions, ordering and review of laboratory studies, ordering and review of radiographic studies, pulse oximetry and re-evaluation of patient's condition. Medications Ordered in ED Medications  sodium chloride flush  (NS) 0.9 % injection 10-40 mL (has no administration in time range)  ceFEPIme (MAXIPIME) 1 g in sodium chloride 0.9 % 100 mL IVPB (1 g Intravenous New Bag/Given 10/31/17 2320)  vancomycin (VANCOCIN) 1,750 mg in sodium chloride 0.9 % 500 mL IVPB (0 mg Intravenous Paused 10/31/17 2315)  morphine 4 MG/ML injection 4 mg (4 mg Intravenous Given 10/31/17 2052)  morphine 4 MG/ML injection 4 mg (4 mg Intravenous Given 10/31/17 2247)  ipratropium-albuterol (DUONEB) 0.5-2.5 (3) MG/3ML nebulizer solution 3 mL (3 mLs Nebulization Given 10/31/17 2248)  sodium chloride 0.9 % bolus 500 mL (500 mLs Intravenous New Bag/Given 10/31/17 2247)     Initial Impression / Assessment and Plan / ED Course  I have reviewed the triage vital signs and the nursing notes.  Pertinent labs & imaging results that were available during my care of the patient were reviewed by me and considered in my medical decision making (see chart for details).    Consult: (22: 50) reviewed with Dr. Jonelle Sidle.  Wishes to await return of CT chest before complaining admission and bed assignment.  Reviewed the case.  Have reviewed treatment thus far for H CAP with acte on chronic hypoxia  and CT study pending evaluate for pneumonia versus PE.  Final Clinical Impressions(s) / ED Diagnoses   Final diagnoses:  Chest pain, unspecified type  Acute on chronic respiratory failure with hypoxia ([place])  Severe comorbid illness  HCAP (healthcare-associated pneumonia)   Patient presents with chest pain on the left side that is sharp and has pleuritic quality.  It has been waxing and waning in severity.  She has chronic hypoxic respiratory failure on 3 L home oxygen.  He denies fever.  She reports she has been coughing now.  Patient has had recent hospitalization.  She does have  complex past medical history with pulmonary arterial hypertension and fibrosis.  Patient does have 20,000 leukocytosis.  Lactic acid had initial elevation but has normalized.  Patient has stable  and normal blood pressures.  Patient is tachycardic.  She reports that she has not taken her medications for a couple of days, this includes her metoprolol.  At this time, with no fever, lactic acid normalized and no hypotension I do not feel the patient needs fluid resuscitation for sepsis at this time.  Will initiate treatment for H CAP.  Plan is to proceed also with CT study for further diagnostic evaluation to rule out PE or identified area of consolidation or infiltrate.  Study is pending at this time.  Plan is for admission to hospitalist service. ED Discharge Orders    None       Charlesetta Shanks, MD 10/31/17 [phone]

## 2017-11-01 ENCOUNTER — Encounter (HOSPITAL_COMMUNITY): Payer: Self-pay | Admitting: *Deleted

## 2017-11-01 ENCOUNTER — Encounter (HOSPITAL_COMMUNITY): Payer: Medicare HMO

## 2017-11-01 ENCOUNTER — Inpatient Hospital Stay (HOSPITAL_COMMUNITY): Payer: Medicare HMO

## 2017-11-01 DIAGNOSIS — R079 Chest pain, unspecified: Secondary | ICD-10-CM

## 2017-11-01 DIAGNOSIS — I2729 Other secondary pulmonary hypertension: Secondary | ICD-10-CM

## 2017-11-01 DIAGNOSIS — J189 Pneumonia, unspecified organism: Secondary | ICD-10-CM

## 2017-11-01 LAB — GLUCOSE, CAPILLARY
GLUCOSE-CAPILLARY: 220 mg/dL — AB (ref 65–99)
GLUCOSE-CAPILLARY: 324 mg/dL — AB (ref 65–99)
GLUCOSE-CAPILLARY: 314 mg/dL — AB (ref 65–99)
GLUCOSE-CAPILLARY: 281 mg/dL — AB (ref 65–99)

## 2017-11-01 LAB — COMPREHENSIVE METABOLIC PANEL
ALBUMIN: 2.6 g/dL — AB (ref 3.5–5.0)
ALK PHOS: 150 U/L — AB (ref 38–126)
ALT: 30 U/L (ref 14–54)
ANION GAP: 14 (ref 5–15)
AST: 49 U/L — ABNORMAL HIGH (ref 15–41)
BILIRUBIN TOTAL: 2 mg/dL — AB (ref 0.3–1.2)
BUN: 13 mg/dL (ref 6–20)
CALCIUM: 8.1 mg/dL — AB (ref 8.9–10.3)
CO2: 21 mmol/L — AB (ref 22–32)
Chloride: 101 mmol/L (ref 101–111)
Creatinine, Ser: 1.19 mg/dL — ABNORMAL HIGH (ref 0.44–1.00)
GFR calc non Af Amer: 49 mL/min — ABNORMAL LOW (ref >60)
GFR, EST AFRICAN AMERICAN: 56 mL/min — AB (ref >60)
GLUCOSE: 159 mg/dL — AB (ref 65–99)
POTASSIUM: 3.8 mmol/L (ref 3.5–5.1)
SODIUM: 136 mmol/L (ref 135–145)
TOTAL PROTEIN: 6.1 g/dL — AB (ref 6.5–8.1)

## 2017-11-01 LAB — URINALYSIS, ROUTINE W REFLEX MICROSCOPIC
BILIRUBIN URINE: NEGATIVE
GLUCOSE, UA: 50 mg/dL — AB
HGB URINE DIPSTICK: NEGATIVE
Ketones, ur: NEGATIVE mg/dL
Leukocytes, UA: NEGATIVE
Nitrite: NEGATIVE
PROTEIN: NEGATIVE mg/dL
Specific Gravity, Urine: 1.011 (ref 1.005–1.030)
pH: 5 (ref 5.0–8.0)

## 2017-11-01 LAB — CREATININE, SERUM
CREATININE: 1.21 mg/dL — AB (ref 0.44–1.00)
GFR calc Af Amer: 55 mL/min — ABNORMAL LOW (ref >60)
GFR, EST NON AFRICAN AMERICAN: 48 mL/min — AB (ref >60)

## 2017-11-01 LAB — CBC
HEMATOCRIT: 34.9 % — AB (ref 36.0–46.0)
HEMOGLOBIN: 11.2 g/dL — AB (ref 12.0–15.0)
MCH: 28.1 pg (ref 26.0–34.0)
MCHC: 32.1 g/dL (ref 30.0–36.0)
MCV: 87.7 fL (ref 78.0–100.0)
Platelets: 347 10*3/uL (ref 150–400)
RBC: 3.98 MIL/uL (ref 3.87–5.11)
RDW: 18.1 % — ABNORMAL HIGH (ref 11.5–15.5)
WBC: 21.1 10*3/uL — ABNORMAL HIGH (ref 4.0–10.5)

## 2017-11-01 LAB — PROCALCITONIN: Procalcitonin: 0.18 ng/mL

## 2017-11-01 LAB — TSH: TSH: 1.023 u[IU]/mL (ref 0.350–4.500)

## 2017-11-01 MED ORDER — SODIUM CHLORIDE 0.9 % IV SOLN
1.0000 g | Freq: Three times a day (TID) | INTRAVENOUS | Status: DC
  Administered 2017-11-01: 1 g via INTRAVENOUS
  Filled 2017-11-01 (×2): qty 1

## 2017-11-01 MED ORDER — OXYCODONE HCL 5 MG PO TABS
5.0000 mg | ORAL_TABLET | Freq: Four times a day (QID) | ORAL | Status: DC | PRN
  Administered 2017-11-01: 5 mg via ORAL
  Filled 2017-11-01: qty 1

## 2017-11-01 MED ORDER — ACETAMINOPHEN 650 MG RE SUPP: 650.0000 mg | Freq: Four times a day (QID) | RECTAL | Status: DC | PRN

## 2017-11-01 MED ORDER — LIRAGLUTIDE 18 MG/3ML ~~LOC~~ SOPN: 1.2000 mg | PEN_INJECTOR | Freq: Every day | SUBCUTANEOUS | Status: DC

## 2017-11-01 MED ORDER — SODIUM CHLORIDE 0.9 % IV SOLN
1.0000 g | Freq: Two times a day (BID) | INTRAVENOUS | Status: DC
  Administered 2017-11-01 – 2017-11-02 (×2): 1 g via INTRAVENOUS
  Filled 2017-11-01 (×3): qty 1

## 2017-11-01 MED ORDER — INSULIN ASPART 100 UNIT/ML ~~LOC~~ SOLN
25.0000 [IU] | Freq: Three times a day (TID) | SUBCUTANEOUS | Status: DC
  Administered 2017-11-01 – 2017-11-04 (×11): 25 [IU] via SUBCUTANEOUS

## 2017-11-01 MED ORDER — DIPHENHYDRAMINE HCL 50 MG/ML IJ SOLN
25.0000 mg | Freq: Three times a day (TID) | INTRAMUSCULAR | Status: DC | PRN
  Administered 2017-11-03: 25 mg via INTRAVENOUS
  Filled 2017-11-01: qty 1

## 2017-11-01 MED ORDER — METHYLPREDNISOLONE SODIUM SUCC 125 MG IJ SOLR
80.0000 mg | Freq: Three times a day (TID) | INTRAMUSCULAR | Status: DC
  Administered 2017-11-01 – 2017-11-02 (×4): 80 mg via INTRAVENOUS
  Filled 2017-11-01 (×4): qty 2

## 2017-11-01 MED ORDER — PANTOPRAZOLE SODIUM 40 MG PO TBEC
40.0000 mg | DELAYED_RELEASE_TABLET | Freq: Two times a day (BID) | ORAL | Status: DC
  Administered 2017-11-01 – 2017-11-04 (×7): 40 mg via ORAL
  Filled 2017-11-01 (×7): qty 1

## 2017-11-01 MED ORDER — VANCOMYCIN HCL IN DEXTROSE 750-5 MG/150ML-% IV SOLN
750.0000 mg | Freq: Two times a day (BID) | INTRAVENOUS | Status: DC
  Administered 2017-11-01 – 2017-11-02 (×3): 750 mg via INTRAVENOUS
  Filled 2017-11-01 (×4): qty 150

## 2017-11-01 MED ORDER — ASPIRIN EC 81 MG PO TBEC
81.0000 mg | DELAYED_RELEASE_TABLET | Freq: Every day | ORAL | Status: DC
  Administered 2017-11-01 – 2017-11-04 (×4): 81 mg via ORAL
  Filled 2017-11-01 (×4): qty 1

## 2017-11-01 MED ORDER — ONDANSETRON HCL 4 MG/2ML IJ SOLN: 4.0000 mg | Freq: Four times a day (QID) | INTRAMUSCULAR | Status: DC | PRN

## 2017-11-01 MED ORDER — FENOFIBRATE 160 MG PO TABS
160.0000 mg | ORAL_TABLET | Freq: Every day | ORAL | Status: DC
  Administered 2017-11-01 – 2017-11-04 (×4): 160 mg via ORAL
  Filled 2017-11-01 (×5): qty 1

## 2017-11-01 MED ORDER — ATORVASTATIN CALCIUM 80 MG PO TABS
80.0000 mg | ORAL_TABLET | Freq: Every day | ORAL | Status: DC
  Administered 2017-11-01 – 2017-11-03 (×3): 80 mg via ORAL
  Filled 2017-11-01 (×4): qty 1

## 2017-11-01 MED ORDER — AMLODIPINE BESYLATE 10 MG PO TABS
10.0000 mg | ORAL_TABLET | Freq: Every day | ORAL | Status: DC
  Administered 2017-11-01 – 2017-11-04 (×4): 10 mg via ORAL
  Filled 2017-11-01 (×4): qty 1

## 2017-11-01 MED ORDER — METOPROLOL TARTRATE 25 MG PO TABS
25.0000 mg | ORAL_TABLET | Freq: Two times a day (BID) | ORAL | Status: DC
  Administered 2017-11-01 – 2017-11-04 (×8): 25 mg via ORAL
  Filled 2017-11-01 (×8): qty 1

## 2017-11-01 MED ORDER — AZATHIOPRINE 50 MG PO TABS
25.0000 mg | ORAL_TABLET | Freq: Every day | ORAL | Status: DC
Start: 2017-11-01 — End: 2017-11-04
  Administered 2017-11-01 – 2017-11-04 (×4): 25 mg via ORAL
  Filled 2017-11-01 (×4): qty 1

## 2017-11-01 MED ORDER — INSULIN GLARGINE 100 UNIT/ML ~~LOC~~ SOLN
30.0000 [IU] | Freq: Every day | SUBCUTANEOUS | Status: DC
  Administered 2017-11-01 – 2017-11-02 (×2): 30 [IU] via SUBCUTANEOUS
  Filled 2017-11-01 (×2): qty 0.3

## 2017-11-01 MED ORDER — SODIUM CHLORIDE 0.9 % IV SOLN
INTRAVENOUS | Status: DC
  Administered 2017-11-01: 04:00:00 via INTRAVENOUS

## 2017-11-01 MED ORDER — POTASSIUM CHLORIDE CRYS ER 20 MEQ PO TBCR
20.0000 meq | EXTENDED_RELEASE_TABLET | Freq: Every day | ORAL | Status: DC
  Administered 2017-11-01 – 2017-11-04 (×4): 20 meq via ORAL
  Filled 2017-11-01 (×4): qty 1

## 2017-11-01 MED ORDER — HEPARIN SODIUM (PORCINE) 5000 UNIT/ML IJ SOLN
5000.0000 [IU] | Freq: Three times a day (TID) | INTRAMUSCULAR | Status: DC
  Administered 2017-11-01 – 2017-11-03 (×8): 5000 [IU] via SUBCUTANEOUS
  Filled 2017-11-01 (×8): qty 1

## 2017-11-01 MED ORDER — METHYLPREDNISOLONE SODIUM SUCC 125 MG IJ SOLR
125.0000 mg | Freq: Four times a day (QID) | INTRAMUSCULAR | Status: DC
  Administered 2017-11-01 (×2): 125 mg via INTRAVENOUS
  Filled 2017-11-01 (×2): qty 2

## 2017-11-01 MED ORDER — BISACODYL 5 MG PO TBEC: 5.0000 mg | DELAYED_RELEASE_TABLET | Freq: Every day | ORAL | Status: DC | PRN

## 2017-11-01 MED ORDER — ORAL CARE MOUTH RINSE
15.0000 mL | Freq: Two times a day (BID) | OROMUCOSAL | Status: DC
  Administered 2017-11-01 – 2017-11-03 (×4): 15 mL via OROMUCOSAL

## 2017-11-01 MED ORDER — ONDANSETRON HCL 4 MG PO TABS: 4.0000 mg | ORAL_TABLET | Freq: Four times a day (QID) | ORAL | Status: DC | PRN

## 2017-11-01 MED ORDER — METOCLOPRAMIDE HCL 5 MG PO TABS
5.0000 mg | ORAL_TABLET | Freq: Three times a day (TID) | ORAL | Status: DC
  Administered 2017-11-01 – 2017-11-04 (×11): 5 mg via ORAL
  Filled 2017-11-01 (×11): qty 1

## 2017-11-01 MED ORDER — FUROSEMIDE 80 MG PO TABS
80.0000 mg | ORAL_TABLET | Freq: Every day | ORAL | Status: DC
  Administered 2017-11-01 – 2017-11-04 (×4): 80 mg via ORAL
  Filled 2017-11-01 (×4): qty 1

## 2017-11-01 MED ORDER — ACETAMINOPHEN 325 MG PO TABS
650.0000 mg | ORAL_TABLET | Freq: Four times a day (QID) | ORAL | Status: DC | PRN
  Filled 2017-11-01: qty 2

## 2017-11-01 MED ORDER — BUDESONIDE 0.25 MG/2ML IN SUSP
0.2500 mg | Freq: Two times a day (BID) | RESPIRATORY_TRACT | Status: DC
Start: 2017-11-01 — End: 2017-11-04
  Administered 2017-11-01 – 2017-11-04 (×7): 0.25 mg via RESPIRATORY_TRACT
  Filled 2017-11-01 (×8): qty 2

## 2017-11-01 MED ORDER — IOPAMIDOL (ISOVUE-370) INJECTION 76%
INTRAVENOUS | Status: AC
  Administered 2017-11-01: 100 mL
  Filled 2017-11-01: qty 100

## 2017-11-01 MED ORDER — MYCOPHENOLATE SODIUM 180 MG PO TBEC
360.0000 mg | DELAYED_RELEASE_TABLET | Freq: Two times a day (BID) | ORAL | Status: DC
  Administered 2017-11-01 – 2017-11-04 (×8): 360 mg via ORAL
  Filled 2017-11-01 (×9): qty 2

## 2017-11-01 MED ORDER — TIOTROPIUM BROMIDE MONOHYDRATE 18 MCG IN CAPS: 18.0000 ug | ORAL_CAPSULE | Freq: Every day | RESPIRATORY_TRACT | Status: DC | PRN

## 2017-11-01 NOTE — Progress Notes (Signed)
PROGRESS NOTE  KHADEEJA SAWHNEY JAS:[id number] DOB: August 20, 1955 DOA: 10/31/2017 PCP: Patient, No Pcp Per  HPI/Recap of past 24 hours: DEBRA GUIA is a 62 y.o. female with medical history significant of COPD, pulmonary fibrosis, PAH, polymyositis, hypertension, hyperlipidemia, DM, obesity was brought in by family secondary to worsening shortness of breath and cough for the past few days. Pt reported not taking her meds for the past couple of days. Pt also c/o left pleuritic chest pain, denies any pressure. Initial workup showed that she has increased oxygen demand and work of breathing. She was on 2-3 L at home currently 88% on 4 L. In the ED, was placed on bipap. Chest x-ray showed no evidence of new infiltrate. CTA showed no acute PE, basilar predominant interstitial lung disease, unchanged. Pt has leukocytosis (likely steroid induced). Pt admitted to SDU for further management. Of note, pt has multiple admissions for similar issues, with last admission requiring ICU, with no intubation.  Today, pt reported her breathing is better, still on bipap, will transition to [place]. Denies any chest pain/pressure, abdominal pain, N/V/D/C, fever/chills, cough.   Assessment/Plan: Principal Problem:   Hypoxemia Active Problems:   Type 2 diabetes mellitus, uncontrolled (HCC)   Hypertension   COPD exacerbation (HCC)   Pulmonary hypertension   Obesity   Acute hypokalemia   HTN (hypertension)   Acute on chronic respiratory failure with hypoxemia (HCC)   Acute on chronic hypoxic respiratory failure in the setting of PAH, Pulm fibrosis, chronic diastolic HF, ?COPD ??HCAP vs non-med compliance Fever this am 100.9, with leukocytosis (likely due to chronic steroid use) LA 3.45 down trended to WNL Will order procalcitonin pending BC X 2 pending Chest x-ray showed no evidence of new infiltrate CTA showed no acute PE, basilar predominant interstitial lung disease, unchanged Started on IV AB in ED, will continue  IV Cefepime + vancomycin Continue steroids, continue nebs, inhalers Continue O2, mandatory bipap at night Monitor in SDU  Moderate PAH (likely grp I Vs III) RHC on 10/21/17 showed mod PAH ECHO showed EF of 55-60%, Grade 1 DD, PA peak pressure of 71 mm hg PFTs and DLCO recommended prior to d/c, unsure if it was done. 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 Continue Myfortic  Chronic diastolic CHF Appears euvolemic CXR as above Continue lasix 80 mg daily po  Type 2 DM with hyperglycemia A1c on 09/13/17 showed 8.5 Uncontrolled due to chronic steroid use Continue lantus, novolog, victoza, accuchecks  Coronary artery disease S/P BMS Continue beta-blockers, aspirin, fenofibrate, lipitor  HTN Stable Continue amlodipine, metoprolol  HLD LDL 166 on 10/19/17 Continue fenofibrate, lipitor  Polymyositis Continue azathioprine  Obesity    Code Status: Full  Family Communication: None at bedside  Disposition Plan: Once dyspnea improves   Consultants:  None  Procedures:  None  Antimicrobials:  IV Vancomycin  IV Cefepime  DVT prophylaxis:  Heparin   Objective: Vitals:   11/01/17 0629 11/01/17 0858 11/01/17 0902 11/01/17 0955  BP: 102/64   114/83  Pulse:    81  Resp:      Temp: (!) 97.5 F (36.4 C)     TempSrc: Axillary     SpO2:  95% 90%   Weight: 80.7 kg (  177 lb 14.6 oz)     Height: 5\' 3"  (1.6 m)       Intake/Output Summary (Last 24 hours) at 11/01/2017 1012 Last data filed at 11/01/2017 0600 Gross per 24 hour  Intake 635 ml  Output -  Net 635 ml   Filed Weights   10/31/17 1653 11/01/17 0629  Weight: 79.4 kg (175 lb) 80.7 kg (177 lb 14.6 oz)     Exam:   General:  Mild distress   Cardiovascular: S1, S2 present  Respiratory: Diminished BS bilaterally, with exp wheezing bilaterally  Abdomen: Soft, non-tender, non-distended, BS present   Musculoskeletal: No pedal edema bilaterally  Skin: Normal  Psychiatry: Normal mood   Data Reviewed: CBC: Recent Labs  Lab 10/31/17 2030 10/31/17 2049 11/01/17 0348  WBC 20.1*  --  21.1*  NEUTROABS 15.7*  --   --   HGB 12.5 13.3 11.2*  HCT 38.5 39.0 34.9*  MCV 86.3  --  87.7  PLT 427*  --  347   Basic Metabolic Panel: Recent Labs  Lab 10/31/17 2030 10/31/17 2049 11/01/17 0348  NA 133* 136 136  K 3.2* 3.3* 3.8  CL 98* 98* 101  CO2 22  --  21*  GLUCOSE 156* 154* 159*  BUN 12 13 13   CREATININE 1.22* 1.00 1.19*  1.21*  CALCIUM 8.8*  --  8.1*   GFR: Estimated Creatinine Clearance: 49.7 mL/min (A) (by C-G formula based on SCr of 1.21 mg/dL (H)). Liver Function Tests: Recent Labs  Lab 10/31/17 2030 11/01/17 0348  AST 34 49*  ALT 22 30  ALKPHOS 137* 150*  BILITOT 1.5* 2.0*  PROT 7.0 6.1*  ALBUMIN 3.0* 2.6*   Recent Labs  Lab 10/31/17 2030  LIPASE 30   No results for input(s): AMMONIA in the last 168 hours. Coagulation Profile: Recent Labs  Lab 10/31/17 2030  INR 1.13   Cardiac Enzymes: No results for input(s): CKTOTAL, CKMB, CKMBINDEX, TROPONINI in the last 168 hours. BNP (last 3 results) No results for input(s): PROBNP in the last 8760 hours. HbA1C: No results for input(s): HGBA1C in the last 72 hours. CBG: Recent Labs  Lab 10/31/17 2047 11/01/17 0853  GLUCAP 138* 220*   Lipid Profile: No results for input(s): CHOL, HDL, LDLCALC, TRIG, CHOLHDL, LDLDIRECT in the last 72 hours. Thyroid Function Tests: Recent Labs    11/01/17 0348  TSH 1.023   Anemia Panel: No results for input(s): VITAMINB12, FOLATE, FERRITIN, TIBC, IRON, RETICCTPCT in the last 72 hours. Urine analysis:    Component Value Date/Time   COLORURINE YELLOW 07/16/2017 0526    APPEARANCEUR CLEAR 07/16/2017 0526   LABSPEC 1.010 07/16/2017 0526   PHURINE 5.0 07/16/2017 0526   GLUCOSEU >=500 (A) 07/16/2017 0526   HGBUR NEGATIVE 07/16/2017 0526   BILIRUBINUR NEGATIVE 07/16/2017 0526   KETONESUR NEGATIVE 07/16/2017 0526   PROTEINUR NEGATIVE 07/16/2017 0526   NITRITE NEGATIVE 07/16/2017 0526   LEUKOCYTESUR NEGATIVE 07/16/2017 0526   Sepsis Labs: @LABRCNTIP (procalcitonin:4,lacticidven:4)  )No results found for this or any previous visit (from the past 240 hour(s)).    Studies: Ct Angio Chest Pe W/cm &/or Wo Cm  Result Date: 11/01/2017 CLINICAL DATA:  Dyspnea and back pain EXAM: CT ANGIOGRAPHY CHEST WITH CONTRAST TECHNIQUE: Multidetector CT imaging of the chest was performed using the standard protocol during bolus administration of intravenous contrast. Multiplanar CT image reconstructions and MIPs were obtained to evaluate the vascular anatomy. CONTRAST:  48 cc Isovue 370 ISOVUE-370 IOPAMIDOL (ISOVUE-370) INJECTION 76% COMPARISON:  CT 10/20/2017 and  CXR 10/31/2017 FINDINGS: Cardiovascular: Normal heart size without pericardial effusion. Three-vessel coronary arteriosclerosis. Mild-to-moderate aortic atherosclerosis without dissection. No acute pulmonary embolus to the segmental level. Mediastinum/Nodes: No dominant mass of the thyroid gland. No thyromegaly. Unremarkable CT appearance of the thoracic esophagus. No axillary adenopathy. Stable 1.1 cm short axis right paratracheal lymph node. Stable bilateral hilar adenopathy. Lungs/Pleura: No pneumothorax. Bibasilar atelectasis. No pleural effusion. Patchy subpleural and peribronchovascular reticulation and ground-glass attenuation within both lungs as before with mild traction bronchiectasis. Upper Abdomen: Water attenuating cyst in the upper pole the right kidney measuring approximate 3.1 cm. Musculoskeletal: ACDF of the included lower cervical spine. Mild thoracic spondylosis. No acute nor suspicious osseous lesions.  Review of the MIP images confirms the above findings. IMPRESSION: 1. Three-vessel coronary arteriosclerosis and aortic atherosclerosis. 2. No acute pulmonary embolus. 3. Basilar predominant interstitial lung disease, unchanged in appearance compatible with nonspecific interstitial pneumonia. Aortic Atherosclerosis (ICD10-I70.0). Electronically Signed   By: Tollie Eth M.D.   On: 11/01/2017 03:08   Dg Chest Port 1 View  Result Date: 10/31/2017 CLINICAL DATA:  Left-sided chest pain EXAM: PORTABLE CHEST 1 VIEW COMPARISON:  10/21/2017 FINDINGS: 1814 hours. Low lung volumes. The lungs are clear without focal pneumonia, edema, pneumothorax or pleural effusion. Interstitial markings are diffusely coarsened with chronic features. Basilar scarring unchanged. Cardiopericardial silhouette is at upper limits of normal for size. Left PICC line tip overlies the proximal SVC. The visualized bony structures of the thorax are intact. Telemetry leads overlie the chest. IMPRESSION: Stable exam. Basilar scarring with chronic interstitial coarsening. No new or acute interval findings. Electronically Signed   By: Kennith Center M.D.   On: 10/31/2017 18:43    Scheduled Meds: . amLODipine  10 mg Oral Daily  . aspirin EC  81 mg Oral Daily  . atorvastatin  80 mg Oral q1800  . azaTHIOprine  25 mg Oral Daily  . budesonide  0.25 mg Nebulization BID  . fenofibrate  160 mg Oral Daily  . furosemide  80 mg Oral Daily  . heparin  5,000 Units Subcutaneous Q8H  . insulin aspart  25 Units Subcutaneous TID WC  . insulin glargine  30 Units Subcutaneous Daily  . liraglutide  1.2 mg Subcutaneous Daily  . methylPREDNISolone (SOLU-MEDROL) injection  125 mg Intravenous Q6H  . metoCLOPramide  5 mg Oral TID AC  . metoprolol tartrate  25 mg Oral BID  . mycophenolate  360 mg Oral BID  . pantoprazole  40 mg Oral BID AC  . potassium chloride SA  20 mEq Oral Daily    Continuous Infusions: . sodium chloride 75 mL/hr at 11/01/17 0412  .  ceFEPime (MAXIPIME) IV Stopped (11/01/17 0727)  . vancomycin       LOS: 1 day     Briant Cedar, MD [facility]   If 7PM-7AM, please contact night-coverage [email or web address] Password [email or web address] 11/01/2017, 10:12 AM

## 2017-11-01 NOTE — Care Management Note (Signed)
Case Management Note  Patient Details  Name: Elizabeth Montes MRN: [id number] Date of Birth: 11-11-1955  Subjective/Objective:   Hypoxemia                Action/Plan: Patient lives at home; she has a follow up hosp apt [facility] on April 18,2019 at 9:20 am; she is active with [facility] for home health care services; CM will continue to follow for progression of care.  Expected Discharge Date:  11/04/17               Expected Discharge Plan:  Home w Home Health Services  Discharge planning Services  CM Consult  HH Arranged:  RN, Disease Management HH Agency:  [facility]  Status of Service:  In process, will continue to follow  Reola Mosher [phone] 11/01/2017, 1:19 PM

## 2017-11-01 NOTE — Progress Notes (Signed)
Patient transported from ED to 2W14 without any complications

## 2017-11-01 NOTE — ED Notes (Signed)
Provider notified of patients tachypnea and low O2 sat on [place]. Orders received for BiPap. RT notified

## 2017-11-01 NOTE — Progress Notes (Signed)
Pharmacy Antibiotic Note  HUDSYN TRAW is a 62 y.o. female admitted on 10/31/2017 with pneumonia.  Pharmacy has been consulted for Vancomycin and Cefepime dosing. Note her SCr has increased from admission.  Her vancomycin regimen appears appropriate but her cefepime regimen needs adjustment.  Plan: Continue Vancomycin 750mg  IV q12h Change Cefepime to 1g IV q12h Monitor renal function closely Check Vancomycin trough at steady state  Height: 5\' 3"  (160 cm) Weight: 177 lb 14.6 oz (80.7 kg) IBW/kg (Calculated) : 52.4  Temp (24hrs), Avg:99.1 F (37.3 C), Min:97.5 F (36.4 C), Max:100.9 F (38.3 C)  Recent Labs  Lab 10/31/17 2030 10/31/17 2048 10/31/17 2049 10/31/17 2243 11/01/17 0348  WBC 20.1*  --   --   --  21.1*  CREATININE 1.22*  --  1.00  --  1.19*  1.21*  LATICACIDVEN  --  3.45*  --  1.85  --     Estimated Creatinine Clearance: 49.7 mL/min (A) (by C-G formula based on SCr of 1.21 mg/dL (H)).    Allergies  Allergen Reactions  . Lovenox [Enoxaparin Sodium] Other (See Comments)    "made me bleed"    Thank you for allowing pharmacy to be a part of this patient's care.  Sallee Provencal 11/01/2017 10:53 AM

## 2017-11-01 NOTE — ED Notes (Signed)
RT at bedside for Bipap placement 

## 2017-11-02 LAB — BASIC METABOLIC PANEL
Anion gap: 11 (ref 5–15)
BUN: 27 mg/dL — AB (ref 6–20)
CALCIUM: 8 mg/dL — AB (ref 8.9–10.3)
CO2: 19 mmol/L — ABNORMAL LOW (ref 22–32)
CREATININE: 1.16 mg/dL — AB (ref 0.44–1.00)
Chloride: 103 mmol/L (ref 101–111)
GFR calc non Af Amer: 50 mL/min — ABNORMAL LOW (ref >60)
GFR, EST AFRICAN AMERICAN: 58 mL/min — AB (ref >60)
Glucose, Bld: 427 mg/dL — ABNORMAL HIGH (ref 65–99)
Potassium: 3.7 mmol/L (ref 3.5–5.1)
SODIUM: 133 mmol/L — AB (ref 135–145)

## 2017-11-02 LAB — CBC WITH DIFFERENTIAL/PLATELET
BASOS PCT: 0 %
Basophils Absolute: 0 10*3/uL (ref 0.0–0.1)
EOS ABS: 0 10*3/uL (ref 0.0–0.7)
EOS PCT: 0 %
HCT: 33.1 % — ABNORMAL LOW (ref 36.0–46.0)
Hemoglobin: 10.1 g/dL — ABNORMAL LOW (ref 12.0–15.0)
LYMPHS ABS: 0.8 10*3/uL (ref 0.7–4.0)
Lymphocytes Relative: 4 %
MCH: 26.6 pg (ref 26.0–34.0)
MCHC: 30.5 g/dL (ref 30.0–36.0)
MCV: 87.3 fL (ref 78.0–100.0)
MONO ABS: 0.9 10*3/uL (ref 0.1–1.0)
MONOS PCT: 4 %
NEUTROS PCT: 92 %
Neutro Abs: 20 10*3/uL — ABNORMAL HIGH (ref 1.7–7.7)
Platelets: 349 10*3/uL (ref 150–400)
RBC: 3.79 MIL/uL — ABNORMAL LOW (ref 3.87–5.11)
RDW: 17.3 % — AB (ref 11.5–15.5)
WBC: 21.7 10*3/uL — ABNORMAL HIGH (ref 4.0–10.5)

## 2017-11-02 LAB — GLUCOSE, CAPILLARY
Glucose-Capillary: 421 mg/dL — ABNORMAL HIGH (ref 65–99)
Glucose-Capillary: 392 mg/dL — ABNORMAL HIGH (ref 65–99)
Glucose-Capillary: 406 mg/dL — ABNORMAL HIGH (ref 65–99)
Glucose-Capillary: 314 mg/dL — ABNORMAL HIGH (ref 65–99)

## 2017-11-02 LAB — PROCALCITONIN: Procalcitonin: 0.15 ng/mL

## 2017-11-02 MED ORDER — METHYLPREDNISOLONE SODIUM SUCC 40 MG IJ SOLR
40.0000 mg | Freq: Three times a day (TID) | INTRAMUSCULAR | Status: DC
  Administered 2017-11-02 – 2017-11-03 (×2): 40 mg via INTRAVENOUS
  Filled 2017-11-02 (×2): qty 1

## 2017-11-02 MED ORDER — INSULIN ASPART 100 UNIT/ML ~~LOC~~ SOLN
0.0000 [IU] | Freq: Every day | SUBCUTANEOUS | Status: DC
  Administered 2017-11-02: 4 [IU] via SUBCUTANEOUS
  Administered 2017-11-03: 2 [IU] via SUBCUTANEOUS

## 2017-11-02 MED ORDER — AZITHROMYCIN 250 MG PO TABS
500.0000 mg | ORAL_TABLET | Freq: Every day | ORAL | Status: DC
  Administered 2017-11-03 – 2017-11-04 (×2): 500 mg via ORAL
  Filled 2017-11-02 (×2): qty 2

## 2017-11-02 MED ORDER — INSULIN ASPART 100 UNIT/ML ~~LOC~~ SOLN
0.0000 [IU] | Freq: Three times a day (TID) | SUBCUTANEOUS | Status: DC
  Administered 2017-11-02: 15 [IU] via SUBCUTANEOUS
  Administered 2017-11-03 (×2): 11 [IU] via SUBCUTANEOUS
  Administered 2017-11-03: 8 [IU] via SUBCUTANEOUS
  Administered 2017-11-04 (×2): 15 [IU] via SUBCUTANEOUS

## 2017-11-02 MED ORDER — INSULIN GLARGINE 100 UNIT/ML ~~LOC~~ SOLN
20.0000 [IU] | Freq: Two times a day (BID) | SUBCUTANEOUS | Status: DC
  Administered 2017-11-02 – 2017-11-04 (×4): 20 [IU] via SUBCUTANEOUS
  Filled 2017-11-02 (×5): qty 0.2

## 2017-11-02 NOTE — Progress Notes (Signed)
PROGRESS NOTE  Elizabeth Montes [id number]:[id number] DOB: 02/07/1956 DOA: 10/31/2017 PCP: Patient, No Pcp Per  HPI/Recap of past 24 hours: Elizabeth Montes is a 62 y.o. female with medical history significant of COPD, pulmonary fibrosis, PAH, polymyositis, hypertension, hyperlipidemia, DM, obesity was brought in by family secondary to worsening shortness of breath and cough for the past few days. Pt reported not taking her meds for the past couple of days. Pt also c/o left pleuritic chest pain, denies any pressure. Initial workup showed that she has increased oxygen demand and work of breathing. She was on 2-3 L at home currently 88% on 4 L. In the ED, was placed on bipap. Chest x-ray showed no evidence of new infiltrate. CTA showed no acute PE, basilar predominant interstitial lung disease, unchanged. Pt has leukocytosis (likely steroid induced). Pt admitted to SDU for further management. Of note, pt has multiple admissions for similar issues, with last admission requiring ICU, with no intubation.  Today, pt reported her breathing is better on [place] O2 6L. Denies any chest pain/pressure, abdominal pain, N/V/D/C, fever/chills, cough.   Assessment/Plan: Principal Problem:   Hypoxemia Active Problems:   Type 2 diabetes mellitus, uncontrolled (HCC)   Hypertension   COPD exacerbation (HCC)   Pulmonary hypertension   Obesity   Acute hypokalemia   HTN (hypertension)   Acute on chronic respiratory failure with hypoxemia (HCC)   Acute on chronic hypoxic respiratory failure in the setting of PAH, Pulm fibrosis, chronic diastolic HF, ?COPD ??HCAP vs non-med compliance Afebrile, with leukocytosis (likely due to chronic steroid use) LA 3.45 down trended to WNL Procalcitonin low BC X 2, NGTD Chest x-ray showed no evidence of new infiltrate CTA showed no acute PE, basilar predominant interstitial lung disease, unchanged D/C IV Cefepime + vancomycin, will give PO Azithromycin for a total of 5 days of AB to cover  for atypicals as pt has recurrent admission Continue steroids, continue nebs, inhalers Continue O2, mandatory bipap at night Monitor in SDU  Moderate PAH (likely grp I Vs III) RHC on 10/21/17 showed mod PAH ECHO showed EF of 55-60%, Grade 1 DD, PA peak pressure of 71 mm hg PFTs and DLCO recommended prior to d/c, unsure if it was done. 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 Continue Myfortic  Chronic diastolic CHF Appears euvolemic CXR & ECHO as above Continue lasix 80 mg daily po  Type 2 DM with hyperglycemia A1c on 09/13/17 showed 8.5 Uncontrolled due to chronic steroid use Increased lantus, continue novolog, added SSI, bedtime correction, accuchecks  Coronary artery disease S/P BMS Continue beta-blockers, aspirin, fenofibrate, lipitor  HTN Stable Continue amlodipine, metoprolol  HLD LDL 166 on 10/19/17 Continue fenofibrate, lipitor  Polymyositis Continue azathioprine  Obesity    Code Status: Full  Family Communication: None at bedside  Disposition Plan: Once dyspnea improves   Consultants:  None  Procedures:  None  Antimicrobials: PO azithromycin S/P IV Vancomycin and Cefepime  DVT prophylaxis:  Heparin   Objective: Vitals:   11/02/17 0800 11/02/17 0813 11/02/17 0844 11/02/17 1233  BP: 113/70  113/70 100/80  Pulse: 77  73 80  Resp: (!) 24  (!) 26 17  Temp:   97.8 F (36.6 C) (!) 97.5 F (  36.4 C)  TempSrc:   Oral Oral  SpO2: 95% 98% 96% 97%  Weight:      Height:        Intake/Output Summary (Last 24 hours) at 11/02/2017 1517 Last data filed at 11/02/2017 0845 Gross per 24 hour  Intake 1100 ml  Output 1100 ml  Net 0 ml   Filed Weights    10/31/17 1653 11/01/17 0629  Weight: 79.4 kg (175 lb) 80.7 kg (177 lb 14.6 oz)    Exam:   General: NAD  Cardiovascular: S1, S2 present  Respiratory: Diminished BS bilaterally, with mild exp wheezing bilaterally  Abdomen: Soft, non-tender, non-distended, BS present   Musculoskeletal: No pedal edema bilaterally  Skin: Normal  Psychiatry: Normal mood   Data Reviewed: CBC: Recent Labs  Lab 10/31/17 2030 10/31/17 2049 11/01/17 0348 11/02/17 0547  WBC 20.1*  --  21.1* 21.7*  NEUTROABS 15.7*  --   --  20.0*  HGB 12.5 13.3 11.2* 10.1*  HCT 38.5 39.0 34.9* 33.1*  MCV 86.3  --  87.7 87.3  PLT 427*  --  347 349   Basic Metabolic Panel: Recent Labs  Lab 10/31/17 2030 10/31/17 2049 11/01/17 0348 11/02/17 0547  NA 133* 136 136 133*  K 3.2* 3.3* 3.8 3.7  CL 98* 98* 101 103  CO2 22  --  21* 19*  GLUCOSE 156* 154* 159* 427*  BUN 12 13 13  27*  CREATININE 1.22* 1.00 1.19*  1.21* 1.16*  CALCIUM 8.8*  --  8.1* 8.0*   GFR: Estimated Creatinine Clearance: 51.9 mL/min (A) (by C-G formula based on SCr of 1.16 mg/dL (H)). Liver Function Tests: Recent Labs  Lab 10/31/17 2030 11/01/17 0348  AST 34 49*  ALT 22 30  ALKPHOS 137* 150*  BILITOT 1.5* 2.0*  PROT 7.0 6.1*  ALBUMIN 3.0* 2.6*   Recent Labs  Lab 10/31/17 2030  LIPASE 30   No results for input(s): AMMONIA in the last 168 hours. Coagulation Profile: Recent Labs  Lab 10/31/17 2030  INR 1.13   Cardiac Enzymes: No results for input(s): CKTOTAL, CKMB, CKMBINDEX, TROPONINI in the last 168 hours. BNP (last 3 results) No results for input(s): PROBNP in the last 8760 hours. HbA1C: No results for input(s): HGBA1C in the last 72 hours. CBG: Recent Labs  Lab 11/01/17 1152 11/01/17 1654 11/01/17 2111 11/02/17 0759 11/02/17 1230  GLUCAP 324* 314* 281* 421* 392*   Lipid Profile: No results for input(s): CHOL, HDL, LDLCALC, TRIG, CHOLHDL, LDLDIRECT in the last 72 hours. Thyroid Function Tests: Recent Labs      11/01/17 0348  TSH 1.023   Anemia Panel: No results for input(s): VITAMINB12, FOLATE, FERRITIN, TIBC, IRON, RETICCTPCT in the last 72 hours. Urine analysis:    Component Value Date/Time   COLORURINE YELLOW 11/01/2017 1957   APPEARANCEUR CLEAR 11/01/2017 1957   LABSPEC 1.011 11/01/2017 1957   PHURINE 5.0 11/01/2017 1957   GLUCOSEU 50 (A) 11/01/2017 1957   HGBUR NEGATIVE 11/01/2017 1957   BILIRUBINUR NEGATIVE 11/01/2017 1957   KETONESUR NEGATIVE 11/01/2017 1957   PROTEINUR NEGATIVE 11/01/2017 1957   NITRITE NEGATIVE 11/01/2017 1957   LEUKOCYTESUR NEGATIVE 11/01/2017 1957   Sepsis Labs: @LABRCNTIP (procalcitonin:4,lacticidven:4)  ) Recent Results (from the past 240 hour(s))  Culture, blood (routine x 2)     Status: None (Preliminary result)   Collection Time: 10/31/17  6:25 PM  Result Value Ref Range Status   Specimen Description BLOOD RIGHT WRIST  Final   Special Requests IN PEDIATRIC BOTTLE Blood Culture  adequate volume  Final   Culture   Final    NO GROWTH 2 DAYS Performed at [facility], [address], [place], [place] [address]    Report Status PENDING  Incomplete  Culture, blood (routine x 2)     Status: None (Preliminary result)   Collection Time: 10/31/17  6:30 PM  Result Value Ref Range Status   Specimen Description BLOOD RIGHT HAND  Final   Special Requests   Final    BOTTLES DRAWN AEROBIC ONLY Blood Culture adequate volume   Culture   Final    NO GROWTH 2 DAYS Performed at [facility], [address] [place], [place] [address]    Report Status PENDING  Incomplete      Studies: No results found.  Scheduled Meds: . amLODipine  10 mg Oral Daily  . aspirin EC  81 mg Oral Daily  . atorvastatin  80 mg Oral q1800  . azaTHIOprine  25 mg Oral Daily  . budesonide  0.25 mg Nebulization BID  . fenofibrate  160 mg Oral Daily  . furosemide  80 mg Oral Daily  . heparin  5,000 Units Subcutaneous Q8H  . insulin aspart  0-15 Units Subcutaneous  TID WC  . insulin aspart  0-5 Units Subcutaneous QHS  . insulin aspart  25 Units Subcutaneous TID WC  . insulin glargine  20 Units Subcutaneous BID  . liraglutide  1.2 mg Subcutaneous Daily  . mouth rinse  15 mL Mouth Rinse BID  . methylPREDNISolone (SOLU-MEDROL) injection  80 mg Intravenous Q8H  . metoCLOPramide  5 mg Oral TID AC  . metoprolol tartrate  25 mg Oral BID  . mycophenolate  360 mg Oral BID  . pantoprazole  40 mg Oral BID AC  . potassium chloride SA  20 mEq Oral Daily    Continuous Infusions: . ceFEPime (MAXIPIME) IV 1 g (11/02/17 0603)  . vancomycin Stopped (11/02/17 1336)     LOS: 2 days     Briant Cedar, MD [facility]   If 7PM-7AM, please contact night-coverage [email or web address] Password [email or web address] 11/02/2017, 3:17 PM

## 2017-11-02 NOTE — Progress Notes (Signed)
Patient is current;y on [facility] with sats of 96%. Patient is in no distress. RT informed patient per MD patient is to wear BIPAP at night and patient refuses. Will continue to monitor.

## 2017-11-02 NOTE — Progress Notes (Signed)
Pt refusing Bipap at this time. No distress noted. Resting comfortably on 5L [place] 99%. Will continue to monitor.

## 2017-11-02 NOTE — Progress Notes (Addendum)
Inpatient Diabetes Program Recommendations  AACE/ADA: New Consensus Statement on Inpatient Glycemic Control (2015)  Target Ranges:  Prepandial:   less than 140 mg/dL      Peak postprandial:   less than 180 mg/dL (1-2 hours)      Critically ill patients:  140 - 180 mg/dL   Lab Results  Component Value Date   GLUCAP 392 (H) 11/02/2017   HGBA1C 8.5 (H) 09/13/2017    Review of Glycemic Control Results for Elizabeth Montes, Elizabeth Montes (MRN [id number]) as of 11/02/2017 13:39  Ref. Range 11/01/2017 11:52 11/01/2017 16:54 11/01/2017 21:11 11/02/2017 07:59 11/02/2017 12:30  Glucose-Capillary Latest Ref Range: 65 - 99 mg/dL 546 (H) 270 (H) 350 (H) 421 (H) 392 (H)   Diabetes history: DM2 Outpatient Diabetes medications: Toujeo 30 units + Novolog 25 units tid + Victoza 1.2 qd Current orders for Inpatient glycemic control: Lantus 30 units + Novolog 25 units tid + Victoza 1.2  Inpatient Diabetes Program Recommendations:   -Novolog moderate correction scale tid + hs 0-5 units  Thank you, Darel Hong E. Oluwateniola Leitch, RN, MSN, CDE  Diabetes Coordinator Inpatient Glycemic Control Team Team Pager [phone] (8am-5pm) 11/02/2017 1:40 PM

## 2017-11-02 NOTE — Progress Notes (Signed)
Spoke w Kandee Keen, [facility], informed him that patient was admitted. Discussed multiple admissions/ readmissions, will continue to follow for interventions that help prevent readmissions.

## 2017-11-03 DIAGNOSIS — E1165 Type 2 diabetes mellitus with hyperglycemia: Secondary | ICD-10-CM

## 2017-11-03 DIAGNOSIS — E876 Hypokalemia: Secondary | ICD-10-CM

## 2017-11-03 DIAGNOSIS — R0902 Hypoxemia: Secondary | ICD-10-CM

## 2017-11-03 DIAGNOSIS — I10 Essential (primary) hypertension: Secondary | ICD-10-CM

## 2017-11-03 DIAGNOSIS — J9621 Acute and chronic respiratory failure with hypoxia: Secondary | ICD-10-CM

## 2017-11-03 DIAGNOSIS — J441 Chronic obstructive pulmonary disease with (acute) exacerbation: Principal | ICD-10-CM

## 2017-11-03 LAB — BASIC METABOLIC PANEL
Anion gap: 13 (ref 5–15)
BUN: 26 mg/dL — AB (ref 6–20)
CHLORIDE: 101 mmol/L (ref 101–111)
CO2: 22 mmol/L (ref 22–32)
CREATININE: 1.07 mg/dL — AB (ref 0.44–1.00)
Calcium: 8.1 mg/dL — ABNORMAL LOW (ref 8.9–10.3)
GFR calc Af Amer: >60 mL/min (ref >60)
GFR calc non Af Amer: 55 mL/min — ABNORMAL LOW (ref >60)
GLUCOSE: 334 mg/dL — AB (ref 65–99)
POTASSIUM: 3.7 mmol/L (ref 3.5–5.1)
SODIUM: 136 mmol/L (ref 135–145)

## 2017-11-03 LAB — CBC WITH DIFFERENTIAL/PLATELET
Basophils Absolute: 0 10*3/uL (ref 0.0–0.1)
Basophils Relative: 0 %
EOS ABS: 0 10*3/uL (ref 0.0–0.7)
EOS PCT: 0 %
HCT: 31 % — ABNORMAL LOW (ref 36.0–46.0)
Hemoglobin: 9.5 g/dL — ABNORMAL LOW (ref 12.0–15.0)
LYMPHS ABS: 0.5 10*3/uL — AB (ref 0.7–4.0)
LYMPHS PCT: 3 %
MCH: 26.7 pg (ref 26.0–34.0)
MCHC: 30.6 g/dL (ref 30.0–36.0)
MCV: 87.1 fL (ref 78.0–100.0)
MONOS PCT: 5 %
Monocytes Absolute: 0.9 10*3/uL (ref 0.1–1.0)
Neutro Abs: 15.5 10*3/uL — ABNORMAL HIGH (ref 1.7–7.7)
Neutrophils Relative %: 92 %
PLATELETS: 357 10*3/uL (ref 150–400)
RBC: 3.56 MIL/uL — AB (ref 3.87–5.11)
RDW: 17.4 % — ABNORMAL HIGH (ref 11.5–15.5)
WBC: 17 10*3/uL — AB (ref 4.0–10.5)

## 2017-11-03 LAB — GLUCOSE, CAPILLARY
Glucose-Capillary: 247 mg/dL — ABNORMAL HIGH (ref 65–99)
Glucose-Capillary: 266 mg/dL — ABNORMAL HIGH (ref 65–99)
Glucose-Capillary: 285 mg/dL — ABNORMAL HIGH (ref 65–99)
Glucose-Capillary: 318 mg/dL — ABNORMAL HIGH (ref 65–99)
Glucose-Capillary: 349 mg/dL — ABNORMAL HIGH (ref 65–99)

## 2017-11-03 LAB — PROCALCITONIN: Procalcitonin: 0.11 ng/mL

## 2017-11-03 MED ORDER — IPRATROPIUM-ALBUTEROL 0.5-2.5 (3) MG/3ML IN SOLN
3.0000 mL | Freq: Three times a day (TID) | RESPIRATORY_TRACT | Status: DC
  Administered 2017-11-04: 3 mL via RESPIRATORY_TRACT
  Filled 2017-11-03 (×2): qty 3

## 2017-11-03 MED ORDER — METHYLPREDNISOLONE SODIUM SUCC 40 MG IJ SOLR
40.0000 mg | Freq: Two times a day (BID) | INTRAMUSCULAR | Status: DC
  Administered 2017-11-03 – 2017-11-04 (×2): 40 mg via INTRAVENOUS
  Filled 2017-11-03 (×2): qty 1

## 2017-11-03 MED ORDER — IPRATROPIUM-ALBUTEROL 0.5-2.5 (3) MG/3ML IN SOLN
3.0000 mL | Freq: Four times a day (QID) | RESPIRATORY_TRACT | Status: DC
  Administered 2017-11-03: 3 mL via RESPIRATORY_TRACT
  Filled 2017-11-03 (×2): qty 3

## 2017-11-03 MED ORDER — IPRATROPIUM-ALBUTEROL 0.5-2.5 (3) MG/3ML IN SOLN: 3.0000 mL | RESPIRATORY_TRACT | Status: DC | PRN

## 2017-11-03 NOTE — Progress Notes (Signed)
Patient ambulated length of unit and back to room. Patient noted to have dyspnea with exertion and required frequent breaks to deep breathe and improve oxygen saturation and RR. VS stable upon return to room. Patient assisted back to bed. Will continue to monitor.

## 2017-11-03 NOTE — Progress Notes (Addendum)
PROGRESS NOTE    Elizabeth Montes  BTD:[id number] DOB: [birth year] DOA: 10/31/2017 PCP: Patient, No Pcp Per    Brief Narrative:  62 year old female who presented with dyspnea.  She does have significant past medical history for COPD, pulmonary fibrosis, pulmonary hypertension, polymyositis, hypertension, dyslipidemia, morbid obesity and type 2 diabetes mellitus.  Recent hospitalization that required invasive mechanical ventilation, patient developed recurrent worsening, progressive dyspnea, associated with left pleuritic chest pain.  On the initial physical examination blood pressure 141/64, heart rate 114, respiratory rate 37, oxygen saturation 88% moist mucous membranes, lungs with decreased breath sounds bilaterally, positive expiratory wheezing, positive use of accessory respiratory muscle heart S1-S2 present and rhythmic, no gallops, rubs or murmurs, the abdomen protuberant, nontender, no lower extremity edema.  Sodium 133, potassium 3.3, chloride 98, bicarb 22, glucose 26, BUN 12, creatinine 1.22, white count 20.1, hemoglobin 12.5 hematocrit 38.5, platelets 427, chest x-ray with increased insertional markings bilaterally, bibasilar atelectasis.  Chest CT with by lateral groundglass opacities with bibasilar scaring.  EKG sinus rhythm, left axis deviation, normal intervals.  Patient was admitted to the hospital with a working diagnosis of acute on chronic hypoxic respite failure due to COPD exacerbation.   Assessment & Plan:   Principal Problem:   Hypoxemia Active Problems:   Type 2 diabetes mellitus, uncontrolled (HCC)   Hypertension   COPD exacerbation (HCC)   Pulmonary hypertension   Obesity   Acute hypokalemia   HTN (hypertension)   Acute on chronic respiratory failure with hypoxemia (HCC)   1.  Acute on chronic hypoxic respiratory failure due to COPD exacerbation. Slowly improving, will continue bronchodilator therapy, oxymetry monitoring and supplemental 02 per [place] to target 02  saturation above 88%. Taper down systemic steroids. Will request physical therapy and patient to be out of bed in preparation for discharge in am. Antibiotic therapy with azithromycin. Continue budesonide.   2.  Pulmonary fibrosis. Chronic hypoxemic respiratory failure, continue supplemental 02 per [place].   3.  Diastolic heart failure. Continue diuretics, no signs of acute exacerbation, continue blood pressure monitor. Heart failure management with metoprolol.   4.  Type 2 diabetes mellitus. Insulin sliding scale for glucose cover and monitoring, capillary glucose 392, 406, 314, 318, 349, taper steroids to bid . Basal insulin regimen with glargine 20 units bid.   5.  Hypertension. Continue blood pressure control with amlodipine and metoprolol.    DVT prophylaxis: enoxaparin   Code Status:  full Family Communication: no family at the bedside Disposition Plan:  Home in am if continue to improve   Consultants:     Procedures:     Antimicrobials:       Subjective: Dyspnea continue to improved, not yet back to baseline, no chest pain, no nausea or vomiting. Has not been out of bed, at home ambulated with no assistance.   Objective: Vitals:   11/03/17 0730 11/03/17 0748 11/03/17 0828 11/03/17 0914  BP:  137/83 108/62   Pulse: 79 86 85   Resp: (!) 23 (!) 22    Temp:  97.7 F (36.5 C)    TempSrc:  Oral    SpO2: 100% 98%  100%  Weight:      Height:        Intake/Output Summary (Last 24 hours) at 11/03/2017 0922 Last data filed at 11/03/2017 0912 Gross per 24 hour  Intake 750 ml  Output 1625 ml  Net -875 ml   Filed Weights   10/31/17 1653 11/01/17 0629  Weight: 79.4 kg (175 lb)  80.7 kg (177 lb 14.6 oz)    Examination:   General: deconditioned Neurology: Awake and alert, non focal  E ENT: mid pallor, no icterus, oral mucosa moist Cardiovascular: No JVD. S1-S2 present, rhythmic, no gallops, rubs, or murmurs. No lower extremity edema. Pulmonary: decreased breath sounds  bilaterally, decreased air movement, no wheezing or rhonchi, but positive bilateral rales. Gastrointestinal. Abdomen protuberant with no organomegaly, non tender, no rebound or guarding Skin. No rashes Musculoskeletal: no joint deformities     Data Reviewed: I have personally reviewed following labs and imaging studies  CBC: Recent Labs  Lab 10/31/17 2030 10/31/17 2049 11/01/17 0348 11/02/17 0547 11/03/17 0536  WBC 20.1*  --  21.1* 21.7* 17.0*  NEUTROABS 15.7*  --   --  20.0* 15.5*  HGB 12.5 13.3 11.2* 10.1* 9.5*  HCT 38.5 39.0 34.9* 33.1* 31.0*  MCV 86.3  --  87.7 87.3 87.1  PLT 427*  --  347 349 357   Basic Metabolic Panel: Recent Labs  Lab 10/31/17 2030 10/31/17 2049 11/01/17 0348 11/02/17 0547 11/03/17 0536  NA 133* 136 136 133* 136  K 3.2* 3.3* 3.8 3.7 3.7  CL 98* 98* 101 103 101  CO2 22  --  21* 19* 22  GLUCOSE 156* 154* 159* 427* 334*  BUN 12 13 13  27* 26*  CREATININE 1.22* 1.00 1.19*  1.21* 1.16* 1.07*  CALCIUM 8.8*  --  8.1* 8.0* 8.1*   GFR: Estimated Creatinine Clearance: 56.2 mL/min (A) (by C-G formula based on SCr of 1.07 mg/dL (H)). Liver Function Tests: Recent Labs  Lab 10/31/17 2030 11/01/17 0348  AST 34 49*  ALT 22 30  ALKPHOS 137* 150*  BILITOT 1.5* 2.0*  PROT 7.0 6.1*  ALBUMIN 3.0* 2.6*   Recent Labs  Lab 10/31/17 2030  LIPASE 30   No results for input(s): AMMONIA in the last 168 hours. Coagulation Profile: Recent Labs  Lab 10/31/17 2030  INR 1.13   Cardiac Enzymes: No results for input(s): CKTOTAL, CKMB, CKMBINDEX, TROPONINI in the last 168 hours. BNP (last 3 results) No results for input(s): PROBNP in the last 8760 hours. HbA1C: No results for input(s): HGBA1C in the last 72 hours. CBG: Recent Labs  Lab 11/02/17 0759 11/02/17 1230 11/02/17 1640 11/02/17 2039 11/03/17 0750  GLUCAP 421* 392* 406* 314* 318*   Lipid Profile: No results for input(s): CHOL, HDL, LDLCALC, TRIG, CHOLHDL, LDLDIRECT in the last 72  hours. Thyroid Function Tests: Recent Labs    11/01/17 0348  TSH 1.023   Anemia Panel: No results for input(s): VITAMINB12, FOLATE, FERRITIN, TIBC, IRON, RETICCTPCT in the last 72 hours.    Radiology Studies: I have reviewed all of the imaging during this hospital visit personally     Scheduled Meds: . amLODipine  10 mg Oral Daily  . aspirin EC  81 mg Oral Daily  . atorvastatin  80 mg Oral q1800  . azaTHIOprine  25 mg Oral Daily  . azithromycin  500 mg Oral Daily  . budesonide  0.25 mg Nebulization BID  . fenofibrate  160 mg Oral Daily  . furosemide  80 mg Oral Daily  . heparin  5,000 Units Subcutaneous Q8H  . insulin aspart  0-15 Units Subcutaneous TID WC  . insulin aspart  0-5 Units Subcutaneous QHS  . insulin aspart  25 Units Subcutaneous TID WC  . insulin glargine  20 Units Subcutaneous BID  . mouth rinse  15 mL Mouth Rinse BID  . methylPREDNISolone (SOLU-MEDROL) injection  40 mg  Intravenous Q8H  . metoCLOPramide  5 mg Oral TID AC  . metoprolol tartrate  25 mg Oral BID  . mycophenolate  360 mg Oral BID  . pantoprazole  40 mg Oral BID AC  . potassium chloride SA  20 mEq Oral Daily   Continuous Infusions:   LOS: 3 days        [place] Annett Gula, MD [facility] Pager [phone]

## 2017-11-03 NOTE — Progress Notes (Signed)
Encouraged pt. To get out of bed to chair pt. Refused stating "im to tired right now"

## 2017-11-04 DIAGNOSIS — J8410 Pulmonary fibrosis, unspecified: Secondary | ICD-10-CM

## 2017-11-04 LAB — BASIC METABOLIC PANEL
Anion gap: 12 (ref 5–15)
BUN: 22 mg/dL — AB (ref 6–20)
CALCIUM: 8.1 mg/dL — AB (ref 8.9–10.3)
CO2: 24 mmol/L (ref 22–32)
Chloride: 102 mmol/L (ref 101–111)
Creatinine, Ser: 0.89 mg/dL (ref 0.44–1.00)
GFR calc Af Amer: >60 mL/min (ref >60)
Glucose, Bld: 333 mg/dL — ABNORMAL HIGH (ref 65–99)
POTASSIUM: 3.5 mmol/L (ref 3.5–5.1)
SODIUM: 138 mmol/L (ref 135–145)

## 2017-11-04 LAB — GLUCOSE, CAPILLARY
GLUCOSE-CAPILLARY: 351 mg/dL — AB (ref 65–99)
Glucose-Capillary: 377 mg/dL — ABNORMAL HIGH (ref 65–99)

## 2017-11-04 MED ORDER — PREDNISONE 20 MG PO TABS: 40.0000 mg | ORAL_TABLET | Freq: Every day | ORAL | Status: DC

## 2017-11-04 MED ORDER — HEPARIN SOD (PORK) LOCK FLUSH 100 UNIT/ML IV SOLN: 500.0000 [IU] | INTRAVENOUS | Status: DC | PRN

## 2017-11-04 MED ORDER — PREDNISONE 20 MG PO TABS: ORAL_TABLET | ORAL | 0 refills | Status: DC

## 2017-11-04 NOTE — Discharge Summary (Signed)
Physician Discharge Summary  Elizabeth Montes:[id number] DOB: 1955/12/09 DOA: 10/31/2017  PCP: Patient, No Pcp Per  Admit date: 10/31/2017 Discharge date: 11/04/2017  Admitted From: Home Disposition:  Home  Recommendations for Outpatient Follow-up and new medication changes:  1. Follow up with PCP in 1- week 2. Patient has been placed on prednisone taper 3. Added albuetrol as needed for dyspnea  Home Health: no   Equipment/Devices: home 02   Discharge Condition: Stable CODE STATUS: full  Diet recommendation:  Heart healthy and diabetic prudent  Brief/Interim Summary: 62 year old female who presented with dyspnea.  She does have the significant past medical history for COPD, pulmonary fibrosis, pulmonary hypertension, polymyositis, hypertension, dyslipidemia, morbid obesity and type 2 diabetes mellitus.  Recent hospitalization that required invasive mechanical ventilation, patient developed recurrent worsening, progressive dyspnea, associated with left pleuritic chest pain.  On the initial physical examination blood pressure 141/64, heart rate 114, respiratory rate 37, oxygen saturation 88% moist mucous membranes, lungs with decreased breath sounds bilaterally, positive expiratory wheezing, positive use of accessory respiratory muscles, heart S1-S2 present and rhythmic, no gallops, rubs or murmurs, the abdomen protuberant, nontender, no lower extremity edema.  Sodium 133, potassium 3.3, chloride 98, bicarb 22, glucose 156, BUN 12, creatinine 1.22, white count 20.1, hemoglobin 12.5 hematocrit 38.5, platelets 427, chest x-ray with increased insertional markings bilaterally, bibasilar atelectasis.  Chest CT with bilateral groundglass opacities with bibasilar scaring.  EKG sinus rhythm, left axis deviation, normal intervals.  Patient was admitted to the hospital with a working diagnosis of acute on chronic hypoxic respite failure due to COPD exacerbation.  1. Acute on chronic hypoxic respiratory  failure due to COPD exacerbation. Patient was admitted to the stepdown unit, she received aggressive bronchodilator therapy, systemic steroids and supplemental oxygen per nasal cannula. Patient responded well to medical therapy, patient was discharged home to continue bronchodilators, taper steroids down to her home dose of 20 mg of prednisone. Continue inhaled corticosteroids and long acting anticholinergic with tiotropium.   2. Pulmonary fibrosis. Dyspnea has improved, it is back to its baseline, continue bronchodilators and steroids. Her discharge oximetry saturation is 100% on 4 L/min per nasal cannula.   3. Diastolic heart failure. Ischemic and stable, no signs of acute exacerbation. Patient was continued on furosemide for diuresis, to continue heart failure management with metoprolol.  4. Hypertension. Blood pressure remained well-controlled with metoprolol and amlodipine.  5. Type 2 diabetes mellitus with steroid-induced hyperglycemia. Patient was placed on insulin sliding scale for glucose coverage and monitoring, basal insulin regimen with insulin glargine 20 units twice daily, fasting discharge glucose 333. It is expected to improve along with tapered prednisone. Patient will resume insulin NovoLog, liraglutide and toujeo at discharge.   6. Dyslipidemia. Continue fenofibrate and atorvastatin.  7. Polymyositis. Continue with azathioprine and mycophenilate   Discharge Diagnoses:  Principal Problem:   Hypoxemia Active Problems:   Type 2 diabetes mellitus, uncontrolled (HCC)   Hypertension   COPD exacerbation (HCC)   Pulmonary hypertension   Obesity   Acute hypokalemia   HTN (hypertension)   Acute on chronic respiratory failure with hypoxemia [facility])    Discharge Instructions   Allergies as of 11/04/2017      Reactions   Lovenox [enoxaparin Sodium] Other (See Comments)   "made me bleed"      Medication List    TAKE these medications   amLODipine 10 MG tablet Commonly  known as:  NORVASC Take 1 tablet (10 mg total) by mouth daily.   antiseptic oral  rinse Liqd Take 15 mLs by mouth as needed for dry mouth.   aspirin 81 MG EC tablet Take 1 tablet (81 mg total) by mouth daily.   atorvastatin 80 MG tablet Commonly known as:  LIPITOR Take 80 mg by mouth daily at 6 PM.   azaTHIOprine 50 MG tablet Commonly known as:  IMURAN Take 0.5 tablets (25 mg total) by mouth daily.   bisacodyl 5 MG EC tablet Commonly known as:  DULCOLAX Take 1 tablet (5 mg total) by mouth daily as needed for mild constipation.   blood glucose meter kit and supplies Dispense based on patient and insurance preference. Use up to four times daily as directed. (FOR ICD-10 E10.9, E11.9).   budesonide 0.25 MG/2ML nebulizer solution Commonly known as:  PULMICORT Take 2 mLs (0.25 mg total) by nebulization 2 (two) times daily.   fenofibrate 160 MG tablet Take 1 tablet (160 mg total) by mouth daily.   furosemide 80 MG tablet Commonly known as:  LASIX Take 1 tablet (80 mg total) by mouth daily.   liraglutide 18 MG/3ML Sopn Commonly known as:  VICTOZA Inject 1.2 mg into the skin daily.   metoCLOPramide 10 MG tablet Commonly known as:  REGLAN Take 5 mg by mouth 3 (three) times daily before meals.   metoprolol tartrate 25 MG tablet Commonly known as:  LOPRESSOR Take 1 tablet (25 mg total) by mouth 2 (two) times daily.   mycophenolate 360 MG Tbec EC tablet Commonly known as:  MYFORTIC Take 1 tablet (360 mg total) by mouth 2 (two) times daily.   NOVOLOG FLEXPEN 100 UNIT/ML FlexPen Generic drug:  insulin aspart Inject 25 Units into the skin 3 (three) times daily with meals.   oxyCODONE 5 MG immediate release tablet Commonly known as:  Oxy IR/ROXICODONE Take 5 mg by mouth every 6 (six) hours as needed (for pain).   pantoprazole 40 MG tablet Commonly known as:  PROTONIX Take 1 tablet (40 mg total) by mouth 2 (two) times daily before a meal.   Potassium Chloride ER 20 MEQ  Tbcr Take 20 mEq by mouth daily.   predniSONE 20 MG tablet Commonly known as:  DELTASONE Take 2 tablets daily for 2 days, then resume 1 table daily. What changed:    medication strength  additional instructions   SPIRIVA HANDIHALER 18 MCG inhalation capsule Generic drug:  tiotropium Place 18 mcg into inhaler and inhale daily as needed (for wheezing).   sulfamethoxazole-trimethoprim 800-160 MG tablet Commonly known as:  BACTRIM DS,SEPTRA DS Take 1 tablet by mouth every Monday, Wednesday, and Friday.   TOUJEO SOLOSTAR 300 UNIT/ML Sopn Generic drug:  Insulin Glargine Inject 30 Units into the skin daily.      Follow-up [facility]. Go on 11/11/2017.   Specialty:  Internal Medicine Why:  at 9:20 am to establish your primary care provider. This appointment is being held for you. Arrive 15 minutes early, bring your photo ID and insurance cards.  Contact information: [place] [id number] [phone]         Allergies  Allergen Reactions  . Lovenox [Enoxaparin Sodium] Other (See Comments)    "made me bleed"    Consultations:     Procedures/Studies: Dg Chest 2 View  Result Date: 10/12/2017 CLINICAL DATA:  Chest pain.  Shortness of breath.  Cough. EXAM: CHEST - 2 VIEW COMPARISON:  Radiograph and CT 09/30/2017 FINDINGS: Left upper extremity central venous catheter with tip  in the proximal SVC. Lower lung volumes from prior exam. Interstitial coarsening likely secondary to interstitial lung disease, better characterized on CT. Linear bibasilar opacities consistent with atelectasis or scarring. No new airspace opacity. No pleural effusion or pneumothorax. No acute osseous abnormalities. Postsurgical change in the lower cervical spine is partially included. IMPRESSION: Similar radiographic findings of interstitial lung disease. No evidence of acute abnormality. Electronically Signed   By: Jeb Levering M.D.   On:  10/12/2017 00:42   Ct Angio Chest Pe W/cm &/or Wo Cm  Result Date: 11/01/2017 CLINICAL DATA:  Dyspnea and back pain EXAM: CT ANGIOGRAPHY CHEST WITH CONTRAST TECHNIQUE: Multidetector CT imaging of the chest was performed using the standard protocol during bolus administration of intravenous contrast. Multiplanar CT image reconstructions and MIPs were obtained to evaluate the vascular anatomy. CONTRAST:  48 cc Isovue 370 ISOVUE-370 IOPAMIDOL (ISOVUE-370) INJECTION 76% COMPARISON:  CT 10/20/2017 and CXR 10/31/2017 FINDINGS: Cardiovascular: Normal heart size without pericardial effusion. Three-vessel coronary arteriosclerosis. Mild-to-moderate aortic atherosclerosis without dissection. No acute pulmonary embolus to the segmental level. Mediastinum/Nodes: No dominant mass of the thyroid gland. No thyromegaly. Unremarkable CT appearance of the thoracic esophagus. No axillary adenopathy. Stable 1.1 cm short axis right paratracheal lymph node. Stable bilateral hilar adenopathy. Lungs/Pleura: No pneumothorax. Bibasilar atelectasis. No pleural effusion. Patchy subpleural and peribronchovascular reticulation and ground-glass attenuation within both lungs as before with mild traction bronchiectasis. Upper Abdomen: Water attenuating cyst in the upper pole the right kidney measuring approximate 3.1 cm. Musculoskeletal: ACDF of the included lower cervical spine. Mild thoracic spondylosis. No acute nor suspicious osseous lesions. Review of the MIP images confirms the above findings. IMPRESSION: 1. Three-vessel coronary arteriosclerosis and aortic atherosclerosis. 2. No acute pulmonary embolus. 3. Basilar predominant interstitial lung disease, unchanged in appearance compatible with nonspecific interstitial pneumonia. Aortic Atherosclerosis (ICD10-I70.0). Electronically Signed   By: Ashley Royalty M.D.   On: 11/01/2017 03:08   Ct Chest High Resolution  Result Date: 10/21/2017 CLINICAL DATA:  Reported history of interstitial lung  disease. Inpatient. EXAM: CT CHEST WITHOUT CONTRAST TECHNIQUE: Multidetector CT imaging of the chest was performed following the standard protocol without intravenous contrast. High resolution imaging of the lungs, as well as inspiratory and expiratory imaging, was performed. COMPARISON:  09/30/2017 chest CT. Chest radiograph from one day prior. FINDINGS: Cardiovascular: Normal heart size. No significant pericardial fluid/thickening. Left anterior descending, left circumflex and right coronary atherosclerosis. Left PICC terminates in the upper third of the superior vena cava. Atherosclerotic nonaneurysmal thoracic aorta. Borderline prominent main pulmonary artery (3.4 cm diameter), stable. Mediastinum/Nodes: No discrete thyroid nodules. Unremarkable esophagus. No axillary adenopathy. Stable mild right paratracheal adenopathy up to 1.1 cm (series 5/image 38). Stable mild bilateral hilar adenopathy, poorly delineated on these noncontrast images. Lungs/Pleura: No pneumothorax. No pleural effusion. Moderate centrilobular emphysema with mild diffuse bronchial wall thickening. No acute consolidative airspace disease, lung masses or significant pulmonary nodules. There is patchy subpleural and peribronchovascular reticulation and ground-glass attenuation throughout both lungs with associated mild traction bronchiectasis and architectural distortion. There is a strong basilar gradient to these findings. No frank honeycombing. No definite disease progression compared to the 12/19/2016 chest CT. No significant lobular air trapping on the expiration sequence. Upper abdomen: Partially visualized exophytic 2.9 cm simple lateral upper right renal cyst. Musculoskeletal: No aggressive appearing focal osseous lesions. Partially visualized surgical hardware from ACDF in the lower cervical spine. Mild thoracic spondylosis. IMPRESSION: 1. Spectrum of findings compatible with a basilar predominant fibrotic interstitial lung disease  without frank honeycombing.  No definite disease progression compared to 12/19/2016 chest CT. 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 2. Three-vessel coronary atherosclerosis. 3. Stable mild mediastinal and bilateral hilar adenopathy, nonspecific, probably reactive. Aortic Atherosclerosis (ICD10-I70.0) and Emphysema (ICD10-J43.9). Electronically Signed   By: Ilona Sorrel M.D.   On: 10/21/2017 09:30   Dg Chest Port 1 View  Result Date: 10/31/2017 CLINICAL DATA:  Left-sided chest pain EXAM: PORTABLE CHEST 1 VIEW COMPARISON:  10/21/2017 FINDINGS: 1814 hours. Low lung volumes. The lungs are clear without focal pneumonia, edema, pneumothorax or pleural effusion. Interstitial markings are diffusely coarsened with chronic features. Basilar scarring unchanged. Cardiopericardial silhouette is at upper limits of normal for size. Left PICC line tip overlies the proximal SVC. The visualized bony structures of the thorax are intact. Telemetry leads overlie the chest. IMPRESSION: Stable exam. Basilar scarring with chronic interstitial coarsening. No new or acute interval findings. Electronically Signed   By: Misty Stanley M.D.   On: 10/31/2017 18:43   Dg Chest Port 1 View  Result Date: 10/21/2017 CLINICAL DATA:  Interstitial lung disease EXAM: PORTABLE CHEST 1 VIEW COMPARISON:  10/19/2017 FINDINGS: Central venous catheter tip in the SVC unchanged.  No pneumothorax. Streaky linear markings in both lung bases appear chronic and unchanged from prior studies. No acute infiltrate effusion or edema. No interval change IMPRESSION: Bibasilar fibrosis stable.  No acute abnormality. Electronically Signed   By: Franchot Gallo M.D.   On: 10/21/2017 07:35   Dg Chest Port 1 View  Result Date:  10/19/2017 CLINICAL DATA:  62 year old female with shortness of breath. EXAM: PORTABLE CHEST 1 VIEW COMPARISON:  Chest radiograph dated 10/12/2017 FINDINGS: Left-sided PICC with tip in similar position. There is shallow inspiration with bibasilar atelectatic changes/scarring. There is diffuse chronic interstitial coarsening. No focal consolidation, pleural effusion, or pneumothorax. The cardiac silhouette is within normal limits. Coronary vascular calcification and stent as well as atherosclerotic calcification of the aortic arch. Lower cervical fixation hardware. No acute osseous pathology. IMPRESSION: No active disease. Electronically Signed   By: Anner Crete M.D.   On: 10/19/2017 01:19       Subjective: Patient is feeling better, dyspnea at her baseline, no nausea or vomiting, no chest pain. Has been out of bed.   Discharge Exam: Vitals:   11/04/17 0437 11/04/17 0800  BP: 127/68 132/74  Pulse: 79 76  Resp: (!) 28 20  Temp: 98.4 F (36.9 C) 98.3 F (36.8 C)  SpO2: 100% 95%   Vitals:   11/03/17 2015 11/03/17 2354 11/04/17 0437 11/04/17 0800  BP:  123/67 127/68 132/74  Pulse:  84 79 76  Resp:  (!) 24 (!) 28 20  Temp:  98.4 F (36.9 C) 98.4 F (36.9 C) 98.3 F (36.8 C)  TempSrc:  Oral Oral Oral  SpO2: 100% 94% 100% 95%  Weight:   82.7 kg (182 lb 5.1 oz)   Height:        General: Not in pain or dyspnea.  Neurology: Awake and alert, non focal  E ENT: no pallor, no icterus, oral mucosa moist Cardiovascular: No JVD. S1-S2 present, rhythmic, no gallops, rubs, or murmurs. No lower extremity edema. Pulmonary: decraesed breath sounds bilaterally, no wheezing, rhonchi. Positive bibasilar rales. Gastrointestinal. Abdomen with no organomegaly, non tender, no rebound or guarding Skin. No rashes Musculoskeletal: no joint deformities   The results of  significant diagnostics from this hospitalization (including imaging, microbiology, ancillary and laboratory) are listed below for  reference.     Microbiology: Recent Results (from the past 240 hour(s))  Culture, blood (routine x 2)     Status: None (Preliminary result)   Collection Time: 10/31/17  6:25 PM  Result Value Ref Range Status   Specimen Description BLOOD RIGHT WRIST  Final   Special Requests IN PEDIATRIC BOTTLE Blood Culture adequate volume  Final   Culture   Final    NO GROWTH 3 DAYS Performed at [facility], [address], [place], [place] [address]    Report Status PENDING  Incomplete  Culture, blood (routine x 2)     Status: None (Preliminary result)   Collection Time: 10/31/17  6:30 PM  Result Value Ref Range Status   Specimen Description BLOOD RIGHT HAND  Final   Special Requests   Final    BOTTLES DRAWN AEROBIC ONLY Blood Culture adequate volume   Culture   Final    NO GROWTH 3 DAYS Performed at [facility], [place] [address] [place], [place] [address]    Report Status PENDING  Incomplete     Labs: BNP (last 3 results) Recent Labs    09/13/17 0345 09/30/17 0623 10/19/17 0308  BNP 41.3 79.2 834.1*   Basic Metabolic Panel: Recent Labs  Lab 10/31/17 2030 10/31/17 2049 11/01/17 0348 11/02/17 0547 11/03/17 0536 11/04/17 0548  NA 133* 136 136 133* 136 138  K 3.2* 3.3* 3.8 3.7 3.7 3.5  CL 98* 98* 101 103 101 102  CO2 22  --  21* 19* 22 24  GLUCOSE 156* 154* 159* 427* 334* 333*  BUN 12 13 13  27* 26* 22*  CREATININE 1.22* 1.00 1.19*  1.21* 1.16* 1.07* 0.89  CALCIUM 8.8*  --  8.1* 8.0* 8.1* 8.1*   Liver Function Tests: Recent Labs  Lab 10/31/17 2030 11/01/17 0348  AST 34 49*  ALT 22 30  ALKPHOS 137* 150*  BILITOT 1.5* 2.0*  PROT 7.0 6.1*  ALBUMIN 3.0* 2.6*   Recent Labs  Lab 10/31/17 2030  LIPASE 30   No results for input(s): AMMONIA in the last 168 hours. CBC: Recent Labs  Lab 10/31/17 2030 10/31/17 2049 11/01/17 0348 11/02/17 0547 11/03/17 0536  WBC 20.1*  --  21.1* 21.7* 17.0*  NEUTROABS 15.7*  --   --  20.0* 15.5*  HGB 12.5 13.3 11.2*  10.1* 9.5*  HCT 38.5 39.0 34.9* 33.1* 31.0*  MCV 86.3  --  87.7 87.3 87.1  PLT 427*  --  347 349 357   Cardiac Enzymes: No results for input(s): CKTOTAL, CKMB, CKMBINDEX, TROPONINI in the last 168 hours. BNP: Invalid input(s): POCBNP CBG: Recent Labs  Lab 11/03/17 1145 11/03/17 1730 11/03/17 2110 11/03/17 2356 11/04/17 0803  GLUCAP 349* 266* 247* 285* 351*   D-Dimer No results for input(s): DDIMER in the last 72 hours. Hgb A1c No results for input(s): HGBA1C in the last 72 hours. Lipid Profile No results for input(s): CHOL, HDL, LDLCALC, TRIG, CHOLHDL, LDLDIRECT in the last 72 hours. Thyroid function studies No results for input(s): TSH, T4TOTAL, T3FREE, THYROIDAB in the last 72 hours.  Invalid input(s): FREET3 Anemia work up No results for input(s): VITAMINB12, FOLATE, FERRITIN, TIBC, IRON, RETICCTPCT in the last 72 hours. Urinalysis    Component Value Date/Time   COLORURINE YELLOW 11/01/2017 1957   APPEARANCEUR CLEAR 11/01/2017 1957   LABSPEC 1.011 11/01/2017 1957   PHURINE 5.0 11/01/2017 1957  GLUCOSEU 50 (A) 11/01/2017 1957   HGBUR NEGATIVE 11/01/2017 1957   BILIRUBINUR NEGATIVE 11/01/2017 [place] NEGATIVE 11/01/2017 1957   PROTEINUR NEGATIVE 11/01/2017 1957   NITRITE NEGATIVE 11/01/2017 1957   LEUKOCYTESUR NEGATIVE 11/01/2017 1957   Sepsis Labs Invalid input(s): PROCALCITONIN,  WBC,  LACTICIDVEN Microbiology Recent Results (from the past 240 hour(s))  Culture, blood (routine x 2)     Status: None (Preliminary result)   Collection Time: 10/31/17  6:25 PM  Result Value Ref Range Status   Specimen Description BLOOD RIGHT WRIST  Final   Special Requests IN PEDIATRIC BOTTLE Blood Culture adequate volume  Final   Culture   Final    NO GROWTH 3 DAYS Performed at [facility], [address] [place], [place] [address]    Report Status PENDING  Incomplete  Culture, blood (routine x 2)     Status: None (Preliminary result)   Collection Time:  10/31/17  6:30 PM  Result Value Ref Range Status   Specimen Description BLOOD RIGHT HAND  Final   Special Requests   Final    BOTTLES DRAWN AEROBIC ONLY Blood Culture adequate volume   Culture   Final    NO GROWTH 3 DAYS Performed at [facility], [place] [address], [place], [place] [address]    Report Status PENDING  Incomplete     Time coordinating discharge: 45 minutes  SIGNED:   Tawni Millers, MD  [facility] 11/04/2017, 8:32 AM Pager [phone]  If 7PM-7AM, please contact night-coverage [email or web address] Password [email or web address]

## 2017-11-04 NOTE — Progress Notes (Signed)
Patient with discharge orders today.  Called Cory with [facility] health and notified of patient discharge orders for today.  Merlene Laughter. Mort Sawyers, RN Nurse Case [employer]

## 2017-11-04 NOTE — Care Management Important Message (Signed)
Important Message  Patient Details  Name: Elizabeth Montes MRN: [id number] Date of Birth: March 12, 1956   Medicare Important Message Given:  Yes    Dorena Bodo 11/04/2017, 12:55 PM

## 2017-11-04 NOTE — Progress Notes (Signed)
AVS given to patient. All pages reviewed and questions answered. Patient daughter at bedside with home O2. Patients IV and tele d/ced. Patient off floor 1500. Late d/c due to ride availability.   Philemon Kingdom RN

## 2017-11-04 NOTE — Progress Notes (Signed)
Patient with discharge orders home today. In to speak with patient, ask if patient had someone bring her a oxygen for her travel and she said "yes".  Will be returning to the same living situation after discharge.  At discharge, patient has transportation home.  Patient has the ability to pay for  medications and food.  Yancey Flemings, RN BSN Nurse Case [employer]

## 2017-11-05 ENCOUNTER — Telehealth (HOSPITAL_COMMUNITY): Payer: Self-pay

## 2017-11-05 LAB — CULTURE, BLOOD (ROUTINE X 2)
Culture: NO GROWTH
Culture: NO GROWTH
SPECIAL REQUESTS: ADEQUATE
Special Requests: ADEQUATE

## 2017-11-05 NOTE — Telephone Encounter (Signed)
Message left on pt's voice mailbox for pt to call back to schedule a [facility]P visit.  Pt was discharged from the hospital this week.

## 2017-11-08 ENCOUNTER — Ambulatory Visit: Payer: Medicare HMO | Admitting: Neurology

## 2017-11-09 ENCOUNTER — Telehealth (HOSPITAL_COMMUNITY): Payer: Self-pay

## 2017-11-09 NOTE — Telephone Encounter (Signed)
Pt was called to schedule a [facility]P visit and she stated "no".  I asked if she was saying no to the program and she stated "ummhmm, I am... Not right now, I don't want it".  Pt was thanked; email sent to [employer] about the same.

## 2017-11-10 ENCOUNTER — Telehealth: Payer: Self-pay | Admitting: Licensed Clinical Social Worker

## 2017-11-10 NOTE — Telephone Encounter (Signed)
CSW received word from [facility] paramedic that patient would like to disenroll from the paramedicine program. Patient did not state reason just would not like to participate at this time. Patient discharged from paramedicine program. Lasandra Beech, LCSW, CCSW-MCS [phone]

## 2017-11-11 ENCOUNTER — Ambulatory Visit: Payer: Medicare HMO | Admitting: Family Medicine

## 2017-11-29 ENCOUNTER — Emergency Department: Payer: Medicare HMO

## 2017-11-29 ENCOUNTER — Inpatient Hospital Stay
Admission: EM | Admit: 2017-11-29 | Discharge: 2017-12-05 | DRG: 871 | Disposition: A | Discharge status: Home / Self Care | Payer: Medicare HMO | Attending: Internal Medicine | Admitting: Internal Medicine

## 2017-11-29 ENCOUNTER — Other Ambulatory Visit: Payer: Self-pay

## 2017-11-29 DIAGNOSIS — J8410 Pulmonary fibrosis, unspecified: Secondary | ICD-10-CM | POA: Diagnosis present

## 2017-11-29 DIAGNOSIS — E1165 Type 2 diabetes mellitus with hyperglycemia: Secondary | ICD-10-CM | POA: Diagnosis present

## 2017-11-29 DIAGNOSIS — J9601 Acute respiratory failure with hypoxia: Secondary | ICD-10-CM

## 2017-11-29 DIAGNOSIS — Z955 Presence of coronary angioplasty implant and graft: Secondary | ICD-10-CM | POA: Diagnosis not present

## 2017-11-29 DIAGNOSIS — I5032 Chronic diastolic (congestive) heart failure: Secondary | ICD-10-CM | POA: Diagnosis present

## 2017-11-29 DIAGNOSIS — Z9981 Dependence on supplemental oxygen: Secondary | ICD-10-CM | POA: Diagnosis not present

## 2017-11-29 DIAGNOSIS — J441 Chronic obstructive pulmonary disease with (acute) exacerbation: Secondary | ICD-10-CM | POA: Diagnosis present

## 2017-11-29 DIAGNOSIS — D649 Anemia, unspecified: Secondary | ICD-10-CM | POA: Diagnosis present

## 2017-11-29 DIAGNOSIS — Z79899 Other long term (current) drug therapy: Secondary | ICD-10-CM

## 2017-11-29 DIAGNOSIS — J84112 Idiopathic pulmonary fibrosis: Secondary | ICD-10-CM | POA: Diagnosis not present

## 2017-11-29 DIAGNOSIS — Z888 Allergy status to other drugs, medicaments and biological substances status: Secondary | ICD-10-CM

## 2017-11-29 DIAGNOSIS — E872 Acidosis: Secondary | ICD-10-CM | POA: Diagnosis present

## 2017-11-29 DIAGNOSIS — E1122 Type 2 diabetes mellitus with diabetic chronic kidney disease: Secondary | ICD-10-CM | POA: Diagnosis present

## 2017-11-29 DIAGNOSIS — R339 Retention of urine, unspecified: Secondary | ICD-10-CM | POA: Diagnosis not present

## 2017-11-29 DIAGNOSIS — E876 Hypokalemia: Secondary | ICD-10-CM | POA: Diagnosis present

## 2017-11-29 DIAGNOSIS — I130 Hypertensive heart and chronic kidney disease with heart failure and stage 1 through stage 4 chronic kidney disease, or unspecified chronic kidney disease: Secondary | ICD-10-CM | POA: Diagnosis present

## 2017-11-29 DIAGNOSIS — J440 Chronic obstructive pulmonary disease with acute lower respiratory infection: Secondary | ICD-10-CM | POA: Diagnosis present

## 2017-11-29 DIAGNOSIS — R0602 Shortness of breath: Secondary | ICD-10-CM

## 2017-11-29 DIAGNOSIS — Z7982 Long term (current) use of aspirin: Secondary | ICD-10-CM

## 2017-11-29 DIAGNOSIS — I2510 Atherosclerotic heart disease of native coronary artery without angina pectoris: Secondary | ICD-10-CM | POA: Diagnosis present

## 2017-11-29 DIAGNOSIS — I272 Pulmonary hypertension, unspecified: Secondary | ICD-10-CM | POA: Diagnosis present

## 2017-11-29 DIAGNOSIS — J9621 Acute and chronic respiratory failure with hypoxia: Secondary | ICD-10-CM | POA: Diagnosis present

## 2017-11-29 DIAGNOSIS — E785 Hyperlipidemia, unspecified: Secondary | ICD-10-CM | POA: Diagnosis present

## 2017-11-29 DIAGNOSIS — I270 Primary pulmonary hypertension: Secondary | ICD-10-CM | POA: Diagnosis not present

## 2017-11-29 DIAGNOSIS — M3320 Polymyositis, organ involvement unspecified: Secondary | ICD-10-CM | POA: Diagnosis present

## 2017-11-29 DIAGNOSIS — I252 Old myocardial infarction: Secondary | ICD-10-CM

## 2017-11-29 DIAGNOSIS — A419 Sepsis, unspecified organism: Secondary | ICD-10-CM | POA: Diagnosis present

## 2017-11-29 DIAGNOSIS — Z7951 Long term (current) use of inhaled steroids: Secondary | ICD-10-CM

## 2017-11-29 DIAGNOSIS — J189 Pneumonia, unspecified organism: Secondary | ICD-10-CM | POA: Diagnosis present

## 2017-11-29 DIAGNOSIS — N182 Chronic kidney disease, stage 2 (mild): Secondary | ICD-10-CM | POA: Diagnosis present

## 2017-11-29 DIAGNOSIS — Z833 Family history of diabetes mellitus: Secondary | ICD-10-CM

## 2017-11-29 DIAGNOSIS — Z794 Long term (current) use of insulin: Secondary | ICD-10-CM | POA: Diagnosis not present

## 2017-11-29 DIAGNOSIS — Z7952 Long term (current) use of systemic steroids: Secondary | ICD-10-CM

## 2017-11-29 LAB — COMPREHENSIVE METABOLIC PANEL
ALT: 37 U/L (ref 14–54)
AST: 63 U/L — AB (ref 15–41)
Albumin: 2.9 g/dL — ABNORMAL LOW (ref 3.5–5.0)
Alkaline Phosphatase: 115 U/L (ref 38–126)
Anion gap: 12 (ref 5–15)
BUN: 12 mg/dL (ref 6–20)
CHLORIDE: 102 mmol/L (ref 101–111)
CO2: 23 mmol/L (ref 22–32)
Calcium: 7.7 mg/dL — ABNORMAL LOW (ref 8.9–10.3)
Creatinine, Ser: 0.75 mg/dL (ref 0.44–1.00)
Glucose, Bld: 83 mg/dL (ref 65–99)
POTASSIUM: 2.8 mmol/L — AB (ref 3.5–5.1)
SODIUM: 137 mmol/L (ref 135–145)
Total Bilirubin: 1.1 mg/dL (ref 0.3–1.2)
Total Protein: 6.4 g/dL — ABNORMAL LOW (ref 6.5–8.1)

## 2017-11-29 LAB — CBC WITH DIFFERENTIAL/PLATELET
Basophils Absolute: 0.1 10*3/uL (ref 0–0.1)
Basophils Relative: 1 %
Eosinophils Absolute: 0.4 10*3/uL (ref 0–0.7)
Eosinophils Relative: 2 %
HEMATOCRIT: 32.6 % — AB (ref 35.0–47.0)
HEMOGLOBIN: 10.9 g/dL — AB (ref 12.0–16.0)
LYMPHS ABS: 1.6 10*3/uL (ref 1.0–3.6)
LYMPHS PCT: 9 %
MCH: 27.2 pg (ref 26.0–34.0)
MCHC: 33.6 g/dL (ref 32.0–36.0)
MCV: 81.2 fL (ref 80.0–100.0)
MONOS PCT: 6 %
Monocytes Absolute: 1 10*3/uL — ABNORMAL HIGH (ref 0.2–0.9)
NEUTROS PCT: 82 %
Neutro Abs: 14.1 10*3/uL — ABNORMAL HIGH (ref 1.4–6.5)
Platelets: 464 10*3/uL — ABNORMAL HIGH (ref 150–440)
RBC: 4.02 MIL/uL (ref 3.80–5.20)
RDW: 19 % — ABNORMAL HIGH (ref 11.5–14.5)
WBC: 17.2 10*3/uL — AB (ref 3.6–11.0)

## 2017-11-29 LAB — GLUCOSE, CAPILLARY: Glucose-Capillary: 86 mg/dL (ref 65–99)

## 2017-11-29 LAB — LACTIC ACID, PLASMA: LACTIC ACID, VENOUS: 3.2 mmol/L — AB (ref 0.5–1.9)

## 2017-11-29 LAB — BRAIN NATRIURETIC PEPTIDE: B Natriuretic Peptide: 97 pg/mL (ref 0.0–100.0)

## 2017-11-29 LAB — TROPONIN I: Troponin I: 0.04 ng/mL (ref <0.03)

## 2017-11-29 MED ORDER — IPRATROPIUM-ALBUTEROL 0.5-2.5 (3) MG/3ML IN SOLN
9.0000 mL | Freq: Once | RESPIRATORY_TRACT | Status: AC
  Administered 2017-11-29: 9 mL via RESPIRATORY_TRACT
  Filled 2017-11-29: qty 9

## 2017-11-29 MED ORDER — VANCOMYCIN HCL IN DEXTROSE 1-5 GM/200ML-% IV SOLN
1000.0000 mg | Freq: Two times a day (BID) | INTRAVENOUS | Status: DC
  Administered 2017-11-30: 1000 mg via INTRAVENOUS
  Filled 2017-11-29: qty 200

## 2017-11-29 MED ORDER — ACETAMINOPHEN 650 MG RE SUPP: 650.0000 mg | Freq: Four times a day (QID) | RECTAL | Status: DC | PRN

## 2017-11-29 MED ORDER — INSULIN ASPART 100 UNIT/ML ~~LOC~~ SOLN
0.0000 [IU] | SUBCUTANEOUS | Status: DC
  Administered 2017-11-30: 5 [IU] via SUBCUTANEOUS
  Administered 2017-11-30: 11 [IU] via SUBCUTANEOUS
  Filled 2017-11-29 (×2): qty 1

## 2017-11-29 MED ORDER — VANCOMYCIN HCL IN DEXTROSE 1-5 GM/200ML-% IV SOLN
1000.0000 mg | Freq: Once | INTRAVENOUS | Status: AC
  Administered 2017-11-29: 1000 mg via INTRAVENOUS

## 2017-11-29 MED ORDER — SODIUM CHLORIDE 0.9% FLUSH
3.0000 mL | Freq: Two times a day (BID) | INTRAVENOUS | Status: DC
  Administered 2017-11-29 – 2017-12-05 (×9): 3 mL via INTRAVENOUS

## 2017-11-29 MED ORDER — SODIUM CHLORIDE 0.9 % IV SOLN
2.0000 g | Freq: Once | INTRAVENOUS | Status: AC
  Administered 2017-11-29: 2 g via INTRAVENOUS
  Filled 2017-11-29: qty 2

## 2017-11-29 MED ORDER — POLYETHYLENE GLYCOL 3350 17 G PO PACK: 17.0000 g | PACK | Freq: Every day | ORAL | Status: DC | PRN

## 2017-11-29 MED ORDER — ACETAMINOPHEN 325 MG PO TABS: 650.0000 mg | ORAL_TABLET | Freq: Four times a day (QID) | ORAL | Status: DC | PRN

## 2017-11-29 MED ORDER — MAGNESIUM SULFATE 2 GM/50ML IV SOLN
2.0000 g | Freq: Once | INTRAVENOUS | Status: AC
Start: 2017-11-29 — End: 2017-11-30
  Administered 2017-11-30: 2 g via INTRAVENOUS
  Filled 2017-11-29: qty 50

## 2017-11-29 MED ORDER — MORPHINE SULFATE (PF) 2 MG/ML IV SOLN
1.0000 mg | INTRAVENOUS | Status: DC | PRN
  Administered 2017-11-29: 2 mg via INTRAVENOUS
  Administered 2017-12-02 (×2): 1 mg via INTRAVENOUS
  Administered 2017-12-03: 04:00:00 2 mg via INTRAVENOUS
  Filled 2017-11-29 (×3): qty 1

## 2017-11-29 MED ORDER — METHYLPREDNISOLONE SODIUM SUCC 125 MG IJ SOLR
60.0000 mg | Freq: Two times a day (BID) | INTRAMUSCULAR | Status: DC
  Administered 2017-11-29 – 2017-12-01 (×4): 60 mg via INTRAVENOUS
  Filled 2017-11-29 (×4): qty 2

## 2017-11-29 MED ORDER — HYDROCODONE-ACETAMINOPHEN 5-325 MG PO TABS
1.0000 | ORAL_TABLET | ORAL | Status: DC | PRN
  Administered 2017-12-04: 02:00:00 1 via ORAL
  Filled 2017-11-29: qty 1

## 2017-11-29 MED ORDER — SODIUM CHLORIDE 0.9 % IV SOLN
2.0000 g | Freq: Two times a day (BID) | INTRAVENOUS | Status: DC
  Administered 2017-11-30: 2 g via INTRAVENOUS
  Filled 2017-11-29 (×3): qty 2

## 2017-11-29 MED ORDER — SODIUM CHLORIDE 0.9 % IV BOLUS
1000.0000 mL | Freq: Once | INTRAVENOUS | Status: AC
Start: 2017-11-29 — End: 2017-11-29
  Administered 2017-11-29: 1000 mL via INTRAVENOUS

## 2017-11-29 MED ORDER — IPRATROPIUM-ALBUTEROL 0.5-2.5 (3) MG/3ML IN SOLN
3.0000 mL | RESPIRATORY_TRACT | Status: DC
  Administered 2017-11-29 – 2017-12-01 (×10): 3 mL via RESPIRATORY_TRACT
  Filled 2017-11-29 (×11): qty 3

## 2017-11-29 MED ORDER — ONDANSETRON HCL 4 MG PO TABS: 4.0000 mg | ORAL_TABLET | Freq: Four times a day (QID) | ORAL | Status: DC | PRN

## 2017-11-29 MED ORDER — ALBUTEROL SULFATE (2.5 MG/3ML) 0.083% IN NEBU
2.5000 mg | INHALATION_SOLUTION | Freq: Once | RESPIRATORY_TRACT | Status: AC
  Administered 2017-11-29: 2.5 mg via RESPIRATORY_TRACT
  Filled 2017-11-29: qty 3

## 2017-11-29 MED ORDER — ONDANSETRON HCL 4 MG/2ML IJ SOLN
4.0000 mg | Freq: Four times a day (QID) | INTRAMUSCULAR | Status: DC | PRN
  Filled 2017-11-29: qty 2

## 2017-11-29 MED ORDER — LACTATED RINGERS IV SOLN
INTRAVENOUS | Status: DC
  Administered 2017-11-29 – 2017-11-30 (×2): via INTRAVENOUS

## 2017-11-29 MED ORDER — SODIUM CHLORIDE 0.9% FLUSH: 10.0000 mL | INTRAVENOUS | Status: DC | PRN

## 2017-11-29 MED ORDER — MORPHINE SULFATE (PF) 2 MG/ML IV SOLN
INTRAVENOUS | Status: AC
  Administered 2017-11-29: 2 mg via INTRAVENOUS
  Filled 2017-11-29: qty 1

## 2017-11-29 MED ORDER — ALBUTEROL SULFATE (2.5 MG/3ML) 0.083% IN NEBU
2.5000 mg | INHALATION_SOLUTION | RESPIRATORY_TRACT | Status: DC | PRN
  Administered 2017-12-01: 2.5 mg via RESPIRATORY_TRACT

## 2017-11-29 MED ORDER — POTASSIUM CHLORIDE 10 MEQ/100ML IV SOLN
10.0000 meq | INTRAVENOUS | Status: AC
  Administered 2017-11-29 – 2017-11-30 (×4): 10 meq via INTRAVENOUS
  Filled 2017-11-29 (×4): qty 100

## 2017-11-29 MED ORDER — ENOXAPARIN SODIUM 40 MG/0.4ML ~~LOC~~ SOLN: 40.0000 mg | SUBCUTANEOUS | Status: DC

## 2017-11-29 MED ORDER — BUDESONIDE 0.25 MG/2ML IN SUSP
0.2500 mg | Freq: Two times a day (BID) | RESPIRATORY_TRACT | Status: DC
  Administered 2017-11-30 – 2017-12-04 (×9): 0.25 mg via RESPIRATORY_TRACT
  Filled 2017-11-29 (×12): qty 2

## 2017-11-29 NOTE — H&P (Signed)
[facility] [place] NAME: Elizabeth Montes    MR#:  [id number]  DATE OF BIRTH:  [birth year]  DATE OF ADMISSION:  11/29/2017  PRIMARY CARE PHYSICIAN: Patient, No Pcp Per   REQUESTING/REFERRING PHYSICIAN: Dr.   Laurel Dimmer COMPLAINT:   Chief Complaint  Patient presents with  . Respiratory Distress    HISTORY OF PRESENT ILLNESS:  Elizabeth Montes  is a 61 y.o. 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 Patient has been found to have fever, tachycardia. Chest x-ray shows bibasilar infiltrates  PAST MEDICAL HISTORY:   Past Medical History:  Diagnosis Date  . CAD (coronary artery disease) w/ stent 2017 ([place])   . CHF (congestive heart failure) ([place])   . COPD (chronic obstructive pulmonary disease) ([place])   . Diabetes mellitus without complication ([place])   . Heart attack [facility]) 06/09/2016   06/09/2016 cath/PCI (inf STEMI): 60-70% proximal LAD, LCx normal, RCA mid 80-90% -> Rebel 3.5 x 32 mm BMS to mRCA and Rebel 3.5 x 28 mm BMS to proximal LAD   . HLD (hyperlipidemia)   . Hypertension   . Obesity   . Polymyositis ([place])   . Pulmonary fibrosis ([place])   . Pulmonary hypertension ([place])     PAST SURGICAL HISTORY:   Past Surgical History:  Procedure Laterality Date  . ABDOMINAL SURGERY    . RIGHT HEART CATH N/A 10/21/2017   Procedure: RIGHT HEART CATH;  Surgeon: Jolaine Artist, MD;  Location: [facility];  Service: Cardiovascular;  Laterality: N/A;  . TRACHEOSTOMY      SOCIAL HISTORY:   Social History   Tobacco Use  . Smoking status: Never Smoker  . Smokeless tobacco: Never Used  Substance Use Topics  . Alcohol use: No    FAMILY HISTORY:   Family History  Problem Relation Age of Onset  . Cancer Mother   . Stroke Brother   . Diabetes Sister   . Stroke Sister      DRUG ALLERGIES:   Allergies  Allergen Reactions  . Lovenox [Enoxaparin Sodium] Other (See Comments)    "made me bleed"    REVIEW OF SYSTEMS:   Review of Systems  Constitutional: Positive for malaise/fatigue. Negative for chills and fever.  HENT: Negative for sore throat.   Eyes: Negative for blurred vision, double vision and pain.  Respiratory: Positive for cough, shortness of breath and wheezing. Negative for hemoptysis.   Cardiovascular: Negative for chest pain, palpitations, orthopnea and leg swelling.  Gastrointestinal: Negative for abdominal pain, constipation, diarrhea, heartburn, nausea and vomiting.  Genitourinary: Negative for dysuria and hematuria.  Musculoskeletal: Negative for back pain and joint pain.  Skin: Negative for rash.  Neurological: Negative for sensory change, speech change, focal weakness and headaches.  Endo/Heme/Allergies: Does not bruise/bleed easily.  Psychiatric/Behavioral: Negative for depression. The patient is not nervous/anxious.     MEDICATIONS AT HOME:   Prior to Admission medications   Medication Sig Start Date End Date Taking? Authorizing Provider  amLODipine (NORVASC) 10 MG tablet Take 1 tablet (10 mg total) by mouth daily. 09/18/17  Yes Kayleen Memos, DO  antiseptic oral rinse (BIOTENE) LIQD Take 15 mLs by mouth as needed for dry mouth. 08/27/17  Yes [provider]  aspirin 81 MG EC tablet Take 1 tablet (81 mg total) by mouth daily.  09/18/17  Yes Kayleen Memos, DO  atorvastatin (LIPITOR) 80 MG tablet Take 80 mg by mouth daily at 6 PM. 10/14/17  Yes [provider]  budesonide (PULMICORT) 0.25 MG/2ML nebulizer solution Take 2 mLs (0.25 mg total) by nebulization 2 (two) times daily. 10/04/17  Yes Alma Friendly, MD  fenofibrate 160 MG tablet Take 1 tablet (160 mg total) by mouth daily. 09/18/17  Yes Kayleen Memos, DO  furosemide (LASIX) 80 MG tablet Take 1 tablet (80 mg total) by mouth daily. 09/18/17  Yes Hall, Carole  N, DO  insulin aspart (NOVOLOG FLEXPEN) 100 UNIT/ML FlexPen Inject 25 Units into the skin 3 (three) times daily with meals.    Yes [provider]  liraglutide (VICTOZA) 18 MG/3ML SOPN Inject 1.2 mg into the skin daily. 07/28/17  Yes [provider]  metoCLOPramide (REGLAN) 10 MG tablet Take 5 mg by mouth 3 (three) times daily before meals.   Yes [provider]  metoprolol tartrate (LOPRESSOR) 25 MG tablet Take 1 tablet (25 mg total) by mouth 2 (two) times daily. 09/17/17  Yes Irene Pap N, DO  oxyCODONE (OXY IR/ROXICODONE) 5 MG immediate release tablet Take 5 mg by mouth every 6 (six) hours as needed (for pain).   Yes [provider]  Potassium Chloride ER 20 MEQ TBCR Take 20 mEq by mouth daily. 10/14/17  Yes [provider]  predniSONE (DELTASONE) 20 MG tablet Take 2 tablets daily for 2 days, then resume 1 table daily. Patient taking differently: Take 20 mg by mouth daily with breakfast. Take 2 tablets daily for 2 days, then resume 1 table daily. 11/04/17  Yes Arrien, Jimmy Picket, MD  tiotropium (SPIRIVA HANDIHALER) 18 MCG inhalation capsule Place 18 mcg into inhaler and inhale daily as needed (for wheezing).    Yes [provider]  TOUJEO SOLOSTAR 300 UNIT/ML SOPN Inject 30 Units into the skin daily.  10/14/17  Yes [provider]  azaTHIOprine (IMURAN) 50 MG tablet Take 0.5 tablets (25 mg total) by mouth daily. Patient not taking: Reported on 11/29/2017 09/18/17   Kayleen Memos, DO  bisacodyl (DULCOLAX) 5 MG EC tablet Take 1 tablet (5 mg total) by mouth daily as needed for mild constipation. Patient not taking: Reported on 11/29/2017 09/17/17   Kayleen Memos, DO  blood glucose meter kit and supplies Dispense based on patient and insurance preference. Use up to four times daily as directed. (FOR ICD-10 E10.9, E11.9). 10/04/17   Alma Friendly, MD  mycophenolate (MYFORTIC) 360 MG TBEC EC tablet Take 1 tablet (360 mg total) by mouth 2  (two) times daily. Patient not taking: Reported on 11/29/2017 10/23/17   Elwyn Reach, MD  pantoprazole (PROTONIX) 40 MG tablet Take 1 tablet (40 mg total) by mouth 2 (two) times daily before a meal. 10/04/17 11/03/17  Alma Friendly, MD  sulfamethoxazole-trimethoprim (BACTRIM DS,SEPTRA DS) 800-160 MG tablet Take 1 tablet by mouth every Monday, Wednesday, and Friday. Patient not taking: Reported on 11/29/2017 10/25/17   Elwyn Reach, MD     VITAL SIGNS:  Blood pressure (!) 137/59, pulse (!) 117, temperature (!) 100.4 F (38 C), temperature source Oral, resp. rate (!) 35, height 5' 3" (1.6 m), weight 79.4 kg (175 lb), SpO2 96 %.  PHYSICAL EXAMINATION:  Physical Exam  GENERAL:  62 y.o.-year-old patient lying in the bed , in respiratory distress.  Looks critically ill. EYES: Pupils equal, round, reactive to light and accommodation. No scleral icterus. Extraocular  muscles intact.  HEENT: Head atraumatic, normocephalic. Oropharynx and nasopharynx clear. No oropharyngeal erythema, moist oral mucosa  NECK:  Supple, no jugular venous distention. No thyroid enlargement, no tenderness.  LUNGS: Bilateral wheezing and decreased air entry CARDIOVASCULAR: S1, S2 , tachycardia ABDOMEN: Soft, nontender, nondistended. Bowel sounds present. No organomegaly or mass.  EXTREMITIES: No pedal edema, cyanosis, or clubbing. + 2 pedal & radial pulses b/l.   NEUROLOGIC: Cranial nerves II through XII are intact. No focal Motor or sensory deficits appreciated b/l PSYCHIATRIC: The patient is alert and oriented x 3. Good affect.  SKIN: No obvious rash, lesion, or ulcer.   LABORATORY PANEL:   CBC Recent Labs  Lab 11/29/17 1829  WBC 17.2*  HGB 10.9*  HCT 32.6*  PLT 464*   ------------------------------------------------------------------------------------------------------------------  Chemistries  Recent Labs  Lab 11/29/17 1829  NA 137  K 2.8*  CL 102  CO2 23  GLUCOSE 83  BUN 12  CREATININE  0.75  CALCIUM 7.7*  AST 63*  ALT 37  ALKPHOS 115  BILITOT 1.1   ------------------------------------------------------------------------------------------------------------------  Cardiac Enzymes Recent Labs  Lab 11/29/17 1829  TROPONINI 0.04*   ------------------------------------------------------------------------------------------------------------------  RADIOLOGY:  Dg Chest Port 1 View  Result Date: 11/29/2017 CLINICAL DATA:  Respiratory distress EXAM: PORTABLE CHEST 1 VIEW COMPARISON:  10/31/2017 FINDINGS: Borderline cardiomegaly. Low lung volumes. Predominately basilar and peripheral interstitial lung disease is stable. Left upper extremity PICC tip in the upper SVC. No pneumothorax. IMPRESSION: Basilar interstitial lung disease is stable. Left upper extremity PICC is stable. Electronically Signed   By: Marybelle Killings M.D.   On: 11/29/2017 18:44   IMPRESSION AND PLAN:   * Bibasilar pneumonia with sepsis and acute on chronic hypoxic respiratory failure Multiple recent hospital stays and also on immunosuppressants -IV steroids, IV vancomycin and cefepime - Scheduled Nebulizers - Inhalers -Wean O2 as tolerated - Consult pulmonary if no improvement  *Diabetes mellitus.  Add sliding scale insulin.  Will likely be uncontrolled with hyperglycemia due to IV steroid use.  *Polymyositis.  On immunosuppressants at home which will be continued.  *Chronic diastolic congestive heart failure.  Fluid resuscitation for sepsis underway.  Monitor for fluid overload.  All the records are reviewed and case discussed with ED provider. Management plans discussed with the patient, family and they are in agreement.  CODE STATUS: FULL CODE  TOTAL CC TIME TAKING CARE OF THIS PATIENT: 40 minutes.   Leia Alf Julaine Zimny M.D on 11/29/2017 at 8:33 PM  Between 7am to 6pm - Pager - [phone]  After 6pm go to [email or web address] - password [email or web address] [facility]  Office   [phone]  CC: Primary care physician; Patient, No Pcp Per  Note: This dictation was prepared with Dragon dictation along with smaller phrase technology. Any transcriptional errors that result from this process are unintentional.

## 2017-11-29 NOTE — ED Notes (Signed)
Date and time results received: 11/29/17 1953  Test: troponin Critical Value: 0.04  Name of Provider Notified: Dr. Pershing Proud

## 2017-11-29 NOTE — Consult Note (Signed)
Name: Elizabeth Montes MRN: [id number] DOB: 1956-04-28    ADMISSION DATE:  11/29/2017 CONSULTATION DATE: 11/29/2017  REFERRING MD : Dr. Darvin Neighbours   CHIEF COMPLAINT: Shortness of Breath   BRIEF PATIENT DESCRIPTION: 62 yo female admitted with acute on chronic hypoxic respiratory failure secondary to AECOPD and pulmonary fibrosis requiring Bipap  SIGNIFICANT EVENTS  05/6-Pt admitted to stepdown unit   STUDIES:  None   HISTORY OF PRESENT ILLNESS:   This is a 62 yo female with a PMH of Pulmonary HTN, Pulmonary Fibrosis, Polymyositis, Obesity, HTN, Hyperlipidemia, MI, Diabetes Mellitus, COPD, CAD, and CHF.  She presented to [facility] ER from home on 05/6 with c/o shortness of breath.  Per ER notes EMS arrived at pts home she was in severe respiratory distress on her chronic home O2 @3L  nasal canula O2 sats were 78%, therefore she was placed on CPAP.  In the ER she was transitioned to Bipap due to persistent tachypnea.  CXR revealed chronic interstitial lung disease.  Pt received iv abx due to elevated lactic acid.  She was subsequently admitted to the stepdown unit by hospitalist team for further workup and treatment.  PAST MEDICAL HISTORY :   has a past medical history of CAD (coronary artery disease) w/ stent 2017 ([place]), CHF (congestive heart failure) ([place]), COPD (chronic obstructive pulmonary disease) ([place]), Diabetes mellitus without complication ([place]), Heart attack ([place]) (06/09/2016), HLD (hyperlipidemia), Hypertension, Obesity, Polymyositis ([place]), Pulmonary fibrosis ([place]), and Pulmonary hypertension ([place]).  has a past surgical history that includes Tracheostomy; Abdominal surgery; and RIGHT HEART CATH (N/A, 10/21/2017). Prior to Admission medications   Medication Sig Start Date End Date Taking? Authorizing Provider  amLODipine (NORVASC) 10 MG tablet Take 1 tablet (10 mg total) by mouth daily. 09/18/17  Yes Kayleen Memos, DO  antiseptic oral rinse (BIOTENE) LIQD Take 15 mLs by mouth as needed for  dry mouth. 08/27/17  Yes [provider]  aspirin 81 MG EC tablet Take 1 tablet (81 mg total) by mouth daily. 09/18/17  Yes Kayleen Memos, DO  atorvastatin (LIPITOR) 80 MG tablet Take 80 mg by mouth daily at 6 PM. 10/14/17  Yes [provider]  budesonide (PULMICORT) 0.25 MG/2ML nebulizer solution Take 2 mLs (0.25 mg total) by nebulization 2 (two) times daily. 10/04/17  Yes Alma Friendly, MD  fenofibrate 160 MG tablet Take 1 tablet (160 mg total) by mouth daily. 09/18/17  Yes Kayleen Memos, DO  furosemide (LASIX) 80 MG tablet Take 1 tablet (80 mg total) by mouth daily. 09/18/17  Yes Hall, Carole N, DO  insulin aspart (NOVOLOG FLEXPEN) 100 UNIT/ML FlexPen Inject 25 Units into the skin 3 (three) times daily with meals.    Yes [provider]  liraglutide (VICTOZA) 18 MG/3ML SOPN Inject 1.2 mg into the skin daily. 07/28/17  Yes [provider]  metoCLOPramide (REGLAN) 10 MG tablet Take 5 mg by mouth 3 (three) times daily before meals.   Yes [provider]  metoprolol tartrate (LOPRESSOR) 25 MG tablet Take 1 tablet (25 mg total) by mouth 2 (two) times daily. 09/17/17  Yes Irene Pap N, DO  oxyCODONE (OXY IR/ROXICODONE) 5 MG immediate release tablet Take 5 mg by mouth every 6 (six) hours as needed (for pain).   Yes [provider]  Potassium Chloride ER 20 MEQ TBCR Take 20 mEq by mouth daily. 10/14/17  Yes [provider]  predniSONE (DELTASONE) 20 MG tablet Take 2 tablets daily for 2 days, then resume 1 table  daily. Patient taking differently: Take 20 mg by mouth daily with breakfast. Take 2 tablets daily for 2 days, then resume 1 table daily. 11/04/17  Yes Arrien, Jimmy Picket, MD  tiotropium (SPIRIVA HANDIHALER) 18 MCG inhalation capsule Place 18 mcg into inhaler and inhale daily as needed (for wheezing).    Yes [provider]  TOUJEO SOLOSTAR 300 UNIT/ML SOPN Inject 30 Units into the skin daily.  10/14/17  Yes [provider]  azaTHIOprine (IMURAN) 50 MG tablet Take 0.5 tablets (25 mg total) by mouth daily. Patient not taking: Reported on 11/29/2017 09/18/17   Kayleen Memos, DO  bisacodyl (DULCOLAX) 5 MG EC tablet Take 1 tablet (5 mg total) by mouth daily as needed for mild constipation. Patient not taking: Reported on 11/29/2017 09/17/17   Kayleen Memos, DO  blood glucose meter kit and supplies Dispense based on patient and insurance preference. Use up to four times daily as directed. (FOR ICD-10 E10.9, E11.9). 10/04/17   Alma Friendly, MD  mycophenolate (MYFORTIC) 360 MG TBEC EC tablet Take 1 tablet (360 mg total) by mouth 2 (two) times daily. Patient not taking: Reported on 11/29/2017 10/23/17   Elwyn Reach, MD  pantoprazole (PROTONIX) 40 MG tablet Take 1 tablet (40 mg total) by mouth 2 (two) times daily before a meal. 10/04/17 11/03/17  Alma Friendly, MD  sulfamethoxazole-trimethoprim (BACTRIM DS,SEPTRA DS) 800-160 MG tablet Take 1 tablet by mouth every Monday, Wednesday, and Friday. Patient not taking: Reported on 11/29/2017 10/25/17   Elwyn Reach, MD   Allergies  Allergen Reactions  . Lovenox [Enoxaparin Sodium] Other (See Comments)    "made me bleed"    FAMILY HISTORY:  family history includes Cancer in her mother; Diabetes in her sister; Stroke in her brother and sister. SOCIAL HISTORY:  reports that she has never smoked. She has never used smokeless tobacco. She reports that she does not drink alcohol or use drugs.  REVIEW OF SYSTEMS: Positives in BOLD   Constitutional: Negative for fever, chills, weight loss, malaise/fatigue and diaphoresis.  HENT: Negative for hearing loss, ear pain, nosebleeds, congestion, sore throat, neck pain, tinnitus and ear discharge.   Eyes: Negative for blurred vision, double vision, photophobia, pain, discharge and redness.  Respiratory: cough, hemoptysis, sputum production, shortness of breath, wheezing and stridor.   Cardiovascular: chest pain,  palpitations, orthopnea, claudication, leg swelling and PND.  Gastrointestinal: Negative for heartburn, nausea, vomiting, abdominal pain, diarrhea, constipation, blood in stool and melena.  Genitourinary: Negative for dysuria, urgency, frequency, hematuria and flank pain.  Musculoskeletal: Negative for myalgias, back pain, joint pain and falls.  Skin: Negative for itching and rash.  Neurological: Negative for dizziness, tingling, tremors, sensory change, speech change, focal weakness, seizures, loss of consciousness, weakness and headaches.  Endo/Heme/Allergies: Negative for environmental allergies and polydipsia. Does not bruise/bleed easily.  SUBJECTIVE:  States breathing has improved since she was placed on Bipap   VITAL SIGNS: Temp:  [100.4 F (38 C)] 100.4 F (38 C) (05/06 1827) Pulse Rate:  [110-119] 119 (05/06 2030) Resp:  [28-45] 38 (05/06 2030) BP: (120-154)/(55-135) 149/55 (05/06 2030) SpO2:  [96 %-100 %] 96 % (05/06 2030) Weight:  [79.4 kg (175 lb)] 79.4 kg (175 lb) (05/06 1827)  PHYSICAL EXAMINATION: General: well developed, well nourished female, NAD on Bipap  Neuro: alert and oriented, follows commands  HEENT: supple, no JVD  Cardiovascular: sinus tach, no R/G Lungs: diminished throughout, slightly tachypneic  Abdomen: +BS x4, soft, non tender, non distended  Musculoskeletal: normal bulk and tone, trace bilateral lower extremity edema  Skin: intact no rashes or lesions   Recent Labs  Lab 11/29/17 1829  NA 137  K 2.8*  CL 102  CO2 23  BUN 12  CREATININE 0.75  GLUCOSE 83   Recent Labs  Lab 11/29/17 1829  HGB 10.9*  HCT 32.6*  WBC 17.2*  PLT 464*   Dg Chest Port 1 View  Result Date: 11/29/2017 CLINICAL DATA:  Respiratory distress EXAM: PORTABLE CHEST 1 VIEW COMPARISON:  10/31/2017 FINDINGS: Borderline cardiomegaly. Low lung volumes. Predominately basilar and peripheral interstitial lung disease is stable. Left upper extremity PICC tip in the upper SVC. No  pneumothorax. IMPRESSION: Basilar interstitial lung disease is stable. Left upper extremity PICC is stable. Electronically Signed   By: Marybelle Killings M.D.   On: 11/29/2017 18:44    ASSESSMENT / PLAN: Acute on chronic hypoxic respiratory failure secondary to AECOPD and pulmonary fibrosis Hypokalemia Elevated troponin likely secondary to demand ischemia in setting of respiratory failure Lactic acidosis Anemia without obvious acute blood loss Hx: Diabetes Mellitus, CHF, Pulmonary HTN, and HTN P: Prn Bipap for dyspnea and/or hypoxia  Scheduled and prn bronchodilator therapy IV and nebulized steroids Trend WBC and monitor fever curve Will check PCT if negative will deescalate abx Follow cultures Trend BMP Replace electrolytes as indicated  Monitor UOP LR @50  ml/hr  Continuous telemetry monitoring Trend troponin VTE px: subq lovenox Trend CBC Monitor for s/sx of bleeding and transfuse for hgb <7 SSI   Marda Stalker, [place] Pager [phone] (please enter 7 digits) PCCM Consult Pager [phone] (please enter 7 digits)

## 2017-11-29 NOTE — ED Notes (Signed)
Pt sister Autumn Patty [phone]

## 2017-11-29 NOTE — ED Notes (Signed)
Dr. Pershing Proud at bedside to perform [place] IV.

## 2017-11-29 NOTE — ED Notes (Signed)
IV team at bedside 

## 2017-11-29 NOTE — ED Provider Notes (Signed)
[facility] Emergency Department Provider Note  ___________________________________________   First MD Initiated Contact with Patient 11/29/17 1829     (approximate)  I have reviewed the triage vital signs and the nursing notes.   HISTORY  Chief Complaint Respiratory Distress   HPI Elizabeth Montes is a 62 y.o. female with a history of CAD and CHF was presenting to the emergency department with respiratory distress.  She started having difficult he breathing this morning and was found to be 79% on her 4 L of nasal cannula oxygen by EMS.  She was placed on CPAP.  She is denying any chest pain.  Also found to be 100.2 orally by EMS.  Past Medical History:  Diagnosis Date  . CAD (coronary artery disease) w/ stent 2017 ([place])   . CHF (congestive heart failure) ([place])   . COPD (chronic obstructive pulmonary disease) ([place])   . Diabetes mellitus without complication ([place])   . Heart attack [facility]) 06/09/2016   06/09/2016 cath/PCI (inf STEMI): 60-70% proximal LAD, LCx normal, RCA mid 80-90% -> Rebel 3.5 x 32 mm BMS to mRCA and Rebel 3.5 x 28 mm BMS to proximal LAD   . HLD (hyperlipidemia)   . Hypertension   . Obesity   . Polymyositis ([place])   . Pulmonary fibrosis ([place])   . Pulmonary hypertension [facility])     Patient Active Problem List   Diagnosis Date Noted  . Acute on chronic respiratory failure with hypoxemia ([place]) 10/31/2017  . CHF (congestive heart failure) ([place]) 10/19/2017  . Acute kidney injury ([place]) 10/19/2017  . Pulmonary fibrosis ([place]) 10/19/2017  . Pulmonary hypertension 10/19/2017  . Obesity 10/19/2017  . HLD (hyperlipidemia) 10/19/2017  . Diabetes mellitus without complication ([place]) 04/18/3006  . CAD (coronary artery disease) 10/19/2017  . Acute hypokalemia 10/19/2017  . HTN (hypertension) 10/19/2017  . Acute on chronic respiratory failure ([place]) 10/19/2017  . Hypoxemia   . Acute pulmonary edema (HCC)   . Acute exacerbation of COPD with asthma  ([place]) 09/30/2017  . Pulmonary hypertension ([place]) 09/30/2017  . CKD stage 2 due to type 2 diabetes mellitus ([place]) 09/30/2017  . Diabetes mellitus type 2 in nonobese ([place]) 09/13/2017  . Acute respiratory failure ([place]) 09/13/2017  . COPD exacerbation ([place]) 08/11/2017  . COPD with acute exacerbation ([place]) 08/11/2017  . Acute respiratory failure with hypoxia ([place]) 08/01/2017  . Healthcare-associated pneumonia 08/01/2017  . Diabetes mellitus ([place]) 08/01/2017  . Type 2 diabetes mellitus, uncontrolled ([place]) 07/16/2017  . Acute on chronic respiratory failure with hypoxia ([place]) 07/16/2017  . COPD (chronic obstructive pulmonary disease) ([place]) 07/16/2017  . Hypertension 07/16/2017  . Polymyositis ([place]) 07/16/2017  . Acute renal failure superimposed on stage 2 chronic kidney disease ([place]) 07/16/2017    Past Surgical History:  Procedure Laterality Date  . ABDOMINAL SURGERY    . RIGHT HEART CATH N/A 10/21/2017   Procedure: RIGHT HEART CATH;  Surgeon: Jolaine Artist, MD;  Location: [place] [facility];  Service: Cardiovascular;  Laterality: N/A;  . TRACHEOSTOMY      Prior to Admission medications   Medication Sig Start Date End Date Taking? Authorizing Provider  amLODipine (NORVASC) 10 MG tablet Take 1 tablet (10 mg total) by mouth daily. 09/18/17   Kayleen Memos, DO  antiseptic oral rinse (BIOTENE) LIQD Take 15 mLs by mouth as needed for dry mouth. 08/27/17   [provider]  aspirin 81 MG EC tablet Take 1 tablet (81 mg total) by mouth daily. 09/18/17  Irene Pap N, DO  atorvastatin (LIPITOR) 80 MG tablet Take 80 mg by mouth daily at 6 PM. 10/14/17   [provider]  azaTHIOprine (IMURAN) 50 MG tablet Take 0.5 tablets (25 mg total) by mouth daily. 09/18/17   Kayleen Memos, DO  bisacodyl (DULCOLAX) 5 MG EC tablet Take 1 tablet (5 mg total) by mouth daily as needed for mild constipation. 09/17/17   Kayleen Memos, DO  blood glucose meter kit and supplies Dispense based on patient  and insurance preference. Use up to four times daily as directed. (FOR ICD-10 E10.9, E11.9). 10/04/17   Alma Friendly, MD  budesonide (PULMICORT) 0.25 MG/2ML nebulizer solution Take 2 mLs (0.25 mg total) by nebulization 2 (two) times daily. 10/04/17   Alma Friendly, MD  fenofibrate 160 MG tablet Take 1 tablet (160 mg total) by mouth daily. 09/18/17   Kayleen Memos, DO  furosemide (LASIX) 80 MG tablet Take 1 tablet (80 mg total) by mouth daily. 09/18/17   Kayleen Memos, DO  insulin aspart (NOVOLOG FLEXPEN) 100 UNIT/ML FlexPen Inject 25 Units into the skin 3 (three) times daily with meals.     [provider]  liraglutide (VICTOZA) 18 MG/3ML SOPN Inject 1.2 mg into the skin daily. 07/28/17   [provider]  metoCLOPramide (REGLAN) 10 MG tablet Take 5 mg by mouth 3 (three) times daily before meals.    [provider]  metoprolol tartrate (LOPRESSOR) 25 MG tablet Take 1 tablet (25 mg total) by mouth 2 (two) times daily. 09/17/17   Kayleen Memos, DO  mycophenolate (MYFORTIC) 360 MG TBEC EC tablet Take 1 tablet (360 mg total) by mouth 2 (two) times daily. 10/23/17   Elwyn Reach, MD  oxyCODONE (OXY IR/ROXICODONE) 5 MG immediate release tablet Take 5 mg by mouth every 6 (six) hours as needed (for pain).    [provider]  pantoprazole (PROTONIX) 40 MG tablet Take 1 tablet (40 mg total) by mouth 2 (two) times daily before a meal. 10/04/17 11/03/17  Alma Friendly, MD  Potassium Chloride ER 20 MEQ TBCR Take 20 mEq by mouth daily. 10/14/17   [provider]  predniSONE (DELTASONE) 20 MG tablet Take 2 tablets daily for 2 days, then resume 1 table daily. 11/04/17   Arrien, Jimmy Picket, MD  sulfamethoxazole-trimethoprim (BACTRIM DS,SEPTRA DS) 800-160 MG tablet Take 1 tablet by mouth every Monday, Wednesday, and Friday. 10/25/17   Elwyn Reach, MD  tiotropium (SPIRIVA HANDIHALER) 18 MCG inhalation capsule Place 18 mcg into inhaler and inhale daily  as needed (for wheezing).     [provider]  TOUJEO SOLOSTAR 300 UNIT/ML SOPN Inject 30 Units into the skin daily.  10/14/17   [provider]    Allergies Lovenox [enoxaparin sodium]  Family History  Problem Relation Age of Onset  . Cancer Mother   . Stroke Brother   . Diabetes Sister   . Stroke Sister     Social History Social History   Tobacco Use  . Smoking status: Never Smoker  . Smokeless tobacco: Never Used  Substance Use Topics  . Alcohol use: No  . Drug use: No    Review of Systems  Constitutional: Fever Eyes: No visual changes. ENT: No sore throat. Cardiovascular: Denies chest pain. Respiratory: As above Gastrointestinal: No abdominal pain.  No nausea, no vomiting.  No diarrhea.  No constipation. Genitourinary: Negative for dysuria. Musculoskeletal: Negative for back pain. Skin: Negative for rash. Neurological: Negative  for headaches, focal weakness or numbness.   ____________________________________________   PHYSICAL EXAM:  VITAL SIGNS: ED Triage Vitals [11/29/17 1827]  Enc Vitals Group     BP (!) 154/135     Pulse Rate (!) 117     Resp (!) 45     Temp (!) 100.4 F (38 C)     Temp Source Oral     SpO2 100 %     Weight 175 lb (79.4 kg)     Height 5' 3"  (1.6 m)     Head Circumference      Peak Flow      Pain Score 0     Pain Loc      Pain Edu?      Excl. in [place]?     Constitutional: Patient on CPAP.  Able to speak in 3-4 word fragments. Eyes: Conjunctivae are normal.  Head: Atraumatic. Nose: No congestion/rhinnorhea. Mouth/Throat: Mucous membranes are moist.  Neck: No stridor.   Cardiovascular: Tachycardic, regular rhythm. Grossly normal heart sounds.  Respiratory: Labored respirations with tachypnea.  Coarse wheezing throughout all fields.  Prolonged expiratory phase.  Occasional dry cough.   Gastrointestinal: Soft and nontender. No distention. No CVA tenderness. Musculoskeletal: Mild edema to the bilateral lower  extreme knees. Neurologic:  Normal speech and language. No gross focal neurologic deficits are appreciated. Skin:  Skin is warm, dry and intact. No rash noted. ____________________________________________   LABS (all labs ordered are listed, but only abnormal results are displayed)  Labs Reviewed  COMPREHENSIVE METABOLIC PANEL - Abnormal; Notable for the following components:      Result Value   Potassium 2.8 (*)    Calcium 7.7 (*)    Total Protein 6.4 (*)    Albumin 2.9 (*)    AST 63 (*)    All other components within normal limits  CBC WITH DIFFERENTIAL/PLATELET - Abnormal; Notable for the following components:   WBC 17.2 (*)    Hemoglobin 10.9 (*)    HCT 32.6 (*)    RDW 19.0 (*)    Platelets 464 (*)    Neutro Abs 14.1 (*)    Monocytes Absolute 1.0 (*)    All other components within normal limits  LACTIC ACID, PLASMA - Abnormal; Notable for the following components:   Lactic Acid, Venous 3.2 (*)    All other components within normal limits  TROPONIN I - Abnormal; Notable for the following components:   Troponin I 0.04 (*)    All other components within normal limits  CULTURE, BLOOD (ROUTINE X 2)  CULTURE, BLOOD (ROUTINE X 2)  BRAIN NATRIURETIC PEPTIDE  URINALYSIS, ROUTINE W REFLEX MICROSCOPIC  LACTIC ACID, PLASMA   ____________________________________________  EKG  ED ECG REPORT I, Doran Stabler, the attending physician, personally viewed and interpreted this ECG.   Date: 11/29/2017  EKG Time: 1824  Rate: 129  Rhythm: sinus tachycardia  Axis: Normal  Intervals:none  ST&T Change: No ST segment elevation or depression.  No abnormal T wave inversion.  ____________________________________________  RADIOLOGY  Basilar interstitial lung disease is stable.  No acute finding. ____________________________________________   PROCEDURES  Procedure(s) performed:   .Critical Care Performed by: Orbie Pyo, MD Authorized by: Orbie Pyo, MD   Critical care provider statement:    Critical care time (minutes):  35   Critical care was necessary to treat or prevent imminent or life-threatening deterioration of the following conditions:  Respiratory failure and sepsis   Critical care was time spent personally by  me on the following activities:  Blood draw for specimens, evaluation of patient's response to treatment, examination of patient, ordering and performing treatments and interventions, ordering and review of laboratory studies, ordering and review of radiographic studies, pulse oximetry and re-evaluation of patient's condition  Angiocath insertion Performed by: Doran Stabler  Consent: Verbal consent obtained. Risks and benefits: risks, benefits and alternatives were discussed Time out: Immediately prior to procedure a "time out" was called to verify the correct patient, procedure, equipment, support staff and site/side marked as required.  Preparation: Patient was prepped and draped in the usual sterile fashion.  Vein Location: Right antecubital and right basilic Ultrasound Guided  Gauge: 20  When aiming for the right antecubital vein I instead punctured artery.  We were able to draw blood quickly and then I retracted the catheter/IV.  Pressure was held for 3 to 4 minutes and there was no bleeding or hematoma accumulation thereafter.  Unable to successfully cannulate the right basilic.   Critical Care performed:   ____________________________________________   INITIAL IMPRESSION / ASSESSMENT AND PLAN / ED COURSE  Pertinent labs & imaging results that were available during my care of the patient were reviewed by me and considered in my medical decision making (see chart for details).  Differential includes, but is not limited to, viral syndrome, bronchitis including COPD exacerbation, pneumonia, reactive airway disease including asthma, CHF including exacerbation with or without pulmonary/interstitial  edema, pneumothorax, ACS, thoracic trauma, and pulmonary embolism. As part of my medical decision making, I reviewed the following data within the electronic MEDICAL RECORD NUMBER Notes from prior ED visits  ----------------------------------------- 8:12 PM on 11/29/2017 -----------------------------------------  Patient tolerating the BiPAP well.  Receiving nebulizer treatments.  Sepsis alert initiated and antibiotics ordered for hospital-acquired pneumonia.  Normal BNP.  Likely COPD with sepsis, likely from bronchitis given negative chest x-ray versus early pneumonia.  Patient now able to speak in full sentences.  Decreased respiratory distress at this time.  Patient with very difficult IV access.  She said to me that all of her veins usually below and that the only way to gain IV access is through a left upper extremity port.  Currently, the IV team is accessing the port. ____________________________________________   FINAL CLINICAL IMPRESSION(S) / ED DIAGNOSES  COPD exacerbation.  Sepsis.    NEW MEDICATIONS STARTED DURING THIS VISIT:  New Prescriptions   No medications on file     Note:  This document was prepared using Dragon voice recognition software and may include unintentional dictation errors.     Orbie Pyo, MD 11/29/17 2015

## 2017-11-29 NOTE — ED Triage Notes (Signed)
Pt arrives [facility] from home for resp distress. Pt arrives on cpap, immediately transferred to bipap. Pt is tachypenic, shallow breathing. EMS reports diminished lung sounds, not moving much air. Pt wears 3 L [place] chronically, was 78% on her 3L at the house. When being taken from cpap to bipap pt was 86%. Goes up to 100% after being on bipap for a few minutes. EMS states that pt was at [facility] few weeks ago with SOB and cough and was on bipap and felt better on bipap. Pt is coughing upon arrival. Very tachypenic. Warm to touch. Has port to L upper arm that she states the IV team at [facility] accesses. CBG 156, no pain

## 2017-11-29 NOTE — Progress Notes (Signed)
CODE SEPSIS - PHARMACY COMMUNICATION  **Broad Spectrum Antibiotics should be administered within 1 hour of Sepsis diagnosis**  Time Code Sepsis Called/Page Received: 1827  Antibiotics Ordered: vancomycin and cefepime  Time of 1st antibiotic administration:   Additional action taken by pharmacy: Spoke to RN and asked if MD could d/c code sepsis if not ordering antibiotics. MD ordered abx  Valentina Gu ,PharmD Clinical Pharmacist  11/29/2017  6:47 PM

## 2017-11-29 NOTE — ED Notes (Signed)
Respiratory at bedside to attach nebulizer part to be able to give albuterol ordered.

## 2017-11-29 NOTE — Progress Notes (Signed)
eLink Physician-Brief Progress Note Patient Name: Elizabeth Montes DOB: Jul 05, 1956 MRN: [id number]   Date of Service  11/29/2017  HPI/Events of Note  62 yo female with PMH of Pulmonary Fibrosis and COPD on home O2. Presents with SOB and elevated Lactic Acid. Now on Vancomycin/Cefipime, steroids and BiPAP. Being admitted to stepdown bed. PCCM asked to assume care. VSS.  eICU Interventions  No new orders.      Intervention Category Evaluation Type: New Patient Evaluation  Lenell Antu 11/29/2017, 10:43 PM

## 2017-11-29 NOTE — Progress Notes (Signed)
Pharmacy Antibiotic Note  Elizabeth Montes is a 62 y.o. female admitted on 11/29/2017 with pneumonia.  Pharmacy has been consulted for vanc/cefepime dosing.  Plan: Patient received vanc 1g and cefepime 2g IV x 1  Will continue vanc 1g IV q12h w/ 6 hour stack Will draw vanc trough 05/08 @ 1400 prior to 4th dose Will continue cefepime 2g IV q12h  Ke 0.0663 T1/2 12 hrs Goal trough 15 - 20 mcg/mL  Height: 5\' 3"  (160 cm) Weight: 175 lb (79.4 kg) IBW/kg (Calculated) : 52.4  Temp (24hrs), Avg:100.4 F (38 C), Min:100.4 F (38 C), Max:100.4 F (38 C)  Recent Labs  Lab 11/29/17 1829  WBC 17.2*  CREATININE 0.75  LATICACIDVEN 3.2*    Estimated Creatinine Clearance: 74.6 mL/min (by C-G formula based on SCr of 0.75 mg/dL).    Allergies  Allergen Reactions  . Lovenox [Enoxaparin Sodium] Other (See Comments)    "made me bleed"    Thank you for allowing pharmacy to be a part of this patient's care.  Thomasene Ripple, PharmD, BCPS Clinical Pharmacist 11/29/2017

## 2017-11-29 NOTE — ED Notes (Signed)
X-ray at bedside

## 2017-11-29 NOTE — ED Notes (Signed)
Unable to obtain second set of cultures at this time, accessed port in L arm isn't drawing back. Already have delayed care d/t no access. MD is stating to start antibiotics at this time.

## 2017-11-29 NOTE — ED Notes (Signed)
Unable to obtain US access, attempting to contact IV team about pt L arm port. L arm port is small, circular, and mobile. This RN doesn't feel comfortable accessing it, Bennetta Laos and Tammy Sours, Consulting civil engineer both assessed port and have never come across a port like the pt has. Awaiting to here back from IV team to obtain access at this time.

## 2017-11-30 ENCOUNTER — Other Ambulatory Visit: Payer: Self-pay

## 2017-11-30 DIAGNOSIS — J9601 Acute respiratory failure with hypoxia: Secondary | ICD-10-CM

## 2017-11-30 LAB — LACTIC ACID, PLASMA
Lactic Acid, Venous: 1.7 mmol/L (ref 0.5–1.9)
Lactic Acid, Venous: 2.1 mmol/L (ref 0.5–1.9)

## 2017-11-30 LAB — URINALYSIS, ROUTINE W REFLEX MICROSCOPIC
BILIRUBIN URINE: NEGATIVE
Bacteria, UA: NONE SEEN
Glucose, UA: NEGATIVE mg/dL
Hgb urine dipstick: NEGATIVE
Ketones, ur: NEGATIVE mg/dL
LEUKOCYTES UA: NEGATIVE
Nitrite: NEGATIVE
PH: 6 (ref 5.0–8.0)
Protein, ur: 30 mg/dL — AB
SPECIFIC GRAVITY, URINE: 1.016 (ref 1.005–1.030)

## 2017-11-30 LAB — BASIC METABOLIC PANEL
Anion gap: 10 (ref 5–15)
BUN: 14 mg/dL (ref 6–20)
CO2: 22 mmol/L (ref 22–32)
CREATININE: 0.75 mg/dL (ref 0.44–1.00)
Calcium: 7.1 mg/dL — ABNORMAL LOW (ref 8.9–10.3)
Chloride: 103 mmol/L (ref 101–111)
GFR calc Af Amer: >60 mL/min (ref >60)
GLUCOSE: 252 mg/dL — AB (ref 65–99)
POTASSIUM: 3.9 mmol/L (ref 3.5–5.1)
Sodium: 135 mmol/L (ref 135–145)

## 2017-11-30 LAB — TROPONIN I
Troponin I: 0.04 ng/mL (ref <0.03)
Troponin I: 0.03 ng/mL (ref <0.03)

## 2017-11-30 LAB — CBC
HCT: 29.7 % — ABNORMAL LOW (ref 35.0–47.0)
Hemoglobin: 9.6 g/dL — ABNORMAL LOW (ref 12.0–16.0)
MCH: 26.6 pg (ref 26.0–34.0)
MCHC: 32.4 g/dL (ref 32.0–36.0)
MCV: 81.9 fL (ref 80.0–100.0)
PLATELETS: 415 10*3/uL (ref 150–440)
RBC: 3.63 MIL/uL — ABNORMAL LOW (ref 3.80–5.20)
RDW: 19.3 % — ABNORMAL HIGH (ref 11.5–14.5)
WBC: 18.8 10*3/uL — ABNORMAL HIGH (ref 3.6–11.0)

## 2017-11-30 LAB — GLUCOSE, CAPILLARY
GLUCOSE-CAPILLARY: 240 mg/dL — AB (ref 65–99)
GLUCOSE-CAPILLARY: 306 mg/dL — AB (ref 65–99)
GLUCOSE-CAPILLARY: 305 mg/dL — AB (ref 65–99)
GLUCOSE-CAPILLARY: 350 mg/dL — AB (ref 65–99)
Glucose-Capillary: 107 mg/dL — ABNORMAL HIGH (ref 65–99)
Glucose-Capillary: 326 mg/dL — ABNORMAL HIGH (ref 65–99)

## 2017-11-30 LAB — PROCALCITONIN: Procalcitonin: 0.22 ng/mL

## 2017-11-30 LAB — MRSA PCR SCREENING: MRSA BY PCR: NEGATIVE

## 2017-11-30 LAB — HEMOGLOBIN A1C
Hgb A1c MFr Bld: 7.7 % — ABNORMAL HIGH (ref 4.8–5.6)
MEAN PLASMA GLUCOSE: 174.29 mg/dL

## 2017-11-30 MED ORDER — INSULIN ASPART 100 UNIT/ML ~~LOC~~ SOLN
0.0000 [IU] | Freq: Every day | SUBCUTANEOUS | Status: DC
  Administered 2017-11-30 – 2017-12-02 (×3): 4 [IU] via SUBCUTANEOUS
  Administered 2017-12-03: 22:00:00 2 [IU] via SUBCUTANEOUS
  Filled 2017-11-30 (×4): qty 1

## 2017-11-30 MED ORDER — ORAL CARE MOUTH RINSE
15.0000 mL | Freq: Two times a day (BID) | OROMUCOSAL | Status: DC
  Administered 2017-11-30 – 2017-12-04 (×10): 15 mL via OROMUCOSAL

## 2017-11-30 MED ORDER — SODIUM CHLORIDE 0.9 % IV SOLN
1.0000 g | Freq: Two times a day (BID) | INTRAVENOUS | Status: DC
  Filled 2017-11-30 (×2): qty 1

## 2017-11-30 MED ORDER — INSULIN ASPART 100 UNIT/ML ~~LOC~~ SOLN
5.0000 [IU] | Freq: Three times a day (TID) | SUBCUTANEOUS | Status: DC
  Administered 2017-11-30 – 2017-12-01 (×4): 5 [IU] via SUBCUTANEOUS
  Filled 2017-11-30 (×4): qty 1

## 2017-11-30 MED ORDER — FUROSEMIDE 10 MG/ML IJ SOLN
40.0000 mg | Freq: Every day | INTRAMUSCULAR | Status: DC
  Administered 2017-11-30 – 2017-12-04 (×5): 40 mg via INTRAVENOUS
  Filled 2017-11-30 (×6): qty 4

## 2017-11-30 MED ORDER — INSULIN ASPART 100 UNIT/ML ~~LOC~~ SOLN
0.0000 [IU] | Freq: Three times a day (TID) | SUBCUTANEOUS | Status: DC
  Administered 2017-11-30 – 2017-12-01 (×3): 11 [IU] via SUBCUTANEOUS
  Administered 2017-12-01 (×2): 15 [IU] via SUBCUTANEOUS
  Administered 2017-12-02 (×2): 11 [IU] via SUBCUTANEOUS
  Administered 2017-12-02: 13:00:00 15 [IU] via SUBCUTANEOUS
  Administered 2017-12-03: 5 [IU] via SUBCUTANEOUS
  Administered 2017-12-03 (×2): 8 [IU] via SUBCUTANEOUS
  Administered 2017-12-04 (×2): 3 [IU] via SUBCUTANEOUS
  Administered 2017-12-04: 17:00:00 8 [IU] via SUBCUTANEOUS
  Administered 2017-12-05: 3 [IU] via SUBCUTANEOUS
  Filled 2017-11-30 (×15): qty 1

## 2017-11-30 MED ORDER — IPRATROPIUM-ALBUTEROL 0.5-2.5 (3) MG/3ML IN SOLN
RESPIRATORY_TRACT | Status: AC
  Filled 2017-11-30: qty 3

## 2017-11-30 MED ORDER — SODIUM CHLORIDE 0.9 % IV SOLN
2.0000 g | Freq: Two times a day (BID) | INTRAVENOUS | Status: DC
  Administered 2017-11-30 – 2017-12-01 (×2): 2 g via INTRAVENOUS
  Filled 2017-11-30 (×3): qty 2

## 2017-11-30 MED ORDER — INSULIN GLARGINE 100 UNIT/ML ~~LOC~~ SOLN
20.0000 [IU] | Freq: Every day | SUBCUTANEOUS | Status: DC
  Administered 2017-11-30: 20 [IU] via SUBCUTANEOUS
  Filled 2017-11-30 (×3): qty 0.2

## 2017-11-30 MED ORDER — TRAZODONE HCL 50 MG PO TABS
50.0000 mg | ORAL_TABLET | Freq: Every evening | ORAL | Status: DC | PRN
Start: 2017-11-30 — End: 2017-12-05
  Administered 2017-11-30 (×2): 50 mg via ORAL
  Filled 2017-11-30 (×2): qty 1

## 2017-11-30 MED ORDER — HEPARIN SODIUM (PORCINE) 5000 UNIT/ML IJ SOLN
5000.0000 [IU] | Freq: Three times a day (TID) | INTRAMUSCULAR | Status: DC
  Administered 2017-11-30 – 2017-12-05 (×14): 5000 [IU] via SUBCUTANEOUS
  Filled 2017-11-30 (×15): qty 1

## 2017-11-30 NOTE — Progress Notes (Signed)
Report called to [facility] on 1 C. Then patient transferred to 1 C via bed.

## 2017-11-30 NOTE — Progress Notes (Signed)
Report given to Myra RN at 16:05 pm

## 2017-11-30 NOTE — Progress Notes (Signed)
[facility] - [place] at [facility]   PATIENT NAME: Elizabeth Montes    MR#:  [id number]  DATE OF BIRTH:  [birth year]  SUBJECTIVE:  CHIEF COMPLAINT: Patient is off BiPAP which was placed yesterday as her shortness of breath was getting worse, patient is feeling very weak  REVIEW OF SYSTEMS:  CONSTITUTIONAL: No fever, fatigue or weakness.  EYES: No blurred or double vision.  EARS, NOSE, AND THROAT: No tinnitus or ear pain.  RESPIRATORY:  cough, shortness of breath are better, min wheezing , no hemoptysis.  CARDIOVASCULAR: No chest pain, orthopnea, edema.  GASTROINTESTINAL: No nausea, vomiting, diarrhea or abdominal pain.  GENITOURINARY: No dysuria, hematuria.  ENDOCRINE: No polyuria, nocturia,  HEMATOLOGY: No anemia, easy bruising or bleeding SKIN: No rash or lesion. MUSCULOSKELETAL: No joint pain or arthritis.   NEUROLOGIC: No tingling, numbness, weakness.  PSYCHIATRY: No anxiety or depression.   DRUG ALLERGIES:   Allergies  Allergen Reactions  . Lovenox [Enoxaparin Sodium] Other (See Comments)    "made me bleed"    VITALS:  Blood pressure (!) 131/58, pulse (!) 104, temperature 97.6 F (36.4 C), resp. rate (!) 27, height 5\' 3"  (1.6 m), weight 81.2 kg (179 lb 0.2 oz), SpO2 94 %.  PHYSICAL EXAMINATION:  GENERAL:  62 y.o.-year-old patient lying in the bed with no acute distress.  EYES: Pupils equal, round, reactive to light and accommodation. No scleral icterus. Extraocular muscles intact.  HEENT: Head atraumatic, normocephalic. Oropharynx and nasopharynx clear.  NECK:  Supple, no jugular venous distention. No thyroid enlargement, no tenderness.  LUNGS: mod breath sounds bilaterally, min wheezing, no rales,rhonchi or crepitation. No use of accessory muscles of respiration.  CARDIOVASCULAR: S1, S2 normal. No murmurs, rubs, or gallops.  ABDOMEN: Soft, nontender, nondistended. Bowel sounds present. No organomegaly or mass.  EXTREMITIES: No pedal edema,  cyanosis, or clubbing.  NEUROLOGIC: Cranial nerves II through XII are intact. Muscle strength global weakness in all extremities. Sensation intact. Gait not checked.  PSYCHIATRIC: The patient is alert and oriented x 3.  SKIN: No obvious rash, lesion, or ulcer.    LABORATORY PANEL:   CBC Recent Labs  Lab 11/30/17 0509  WBC 18.8*  HGB 9.6*  HCT 29.7*  PLT 415   ------------------------------------------------------------------------------------------------------------------  Chemistries  Recent Labs  Lab 11/29/17 1829 11/30/17 0509  NA 137 135  K 2.8* 3.9  CL 102 103  CO2 23 22  GLUCOSE 83 252*  BUN 12 14  CREATININE 0.75 0.75  CALCIUM 7.7* 7.1*  AST 63*  --   ALT 37  --   ALKPHOS 115  --   BILITOT 1.1  --    ------------------------------------------------------------------------------------------------------------------  Cardiac Enzymes Recent Labs  Lab 11/30/17 0509  TROPONINI 0.03*   ------------------------------------------------------------------------------------------------------------------  RADIOLOGY:  Dg Chest Port 1 View  Result Date: 11/29/2017 CLINICAL DATA:  Respiratory distress EXAM: PORTABLE CHEST 1 VIEW COMPARISON:  10/31/2017 FINDINGS: Borderline cardiomegaly. Low lung volumes. Predominately basilar and peripheral interstitial lung disease is stable. Left upper extremity PICC tip in the upper SVC. No pneumothorax. IMPRESSION: Basilar interstitial lung disease is stable. Left upper extremity PICC is stable. Electronically Signed   By: Jolaine Click M.D.   On: 11/29/2017 18:44    EKG:   Orders placed or performed during the hospital encounter of 11/29/17  . EKG 12-Lead  . EKG 12-Lead  . ED EKG 12-Lead  . ED EKG 12-Lead    ASSESSMENT AND PLAN:   * Bibasilar pneumonia with sepsis and acute on chronic  hypoxic respiratory failure Multiple recent hospital stays and also on immunosuppressants Off bipap - taper IV steroids, continue IV  vancomycin and cefepime - Scheduled Nebulizers - Inhalers -Wean O2 as tolerated - pulmonary is seeing pt   *Diabetes mellitus.  Add sliding scale insulin.  Will likely be uncontrolled with hyperglycemia due to IV steroid use.  *Polymyositis.  On immunosuppressants at home which will be continued.  *Chronic diastolic congestive heart failure.  Fluid resuscitation for sepsis underway.  Monitor for fluid overload.   * weakness -PT    All the records are reviewed and case discussed with Care Management/Social Workerr. Management plans discussed with the patient, family and they are in agreement.  CODE STATUS: fc  TOTAL TIME TAKING CARE OF THIS PATIENT:  33 minutes.   POSSIBLE D/C IN 2-3 DAYS, DEPENDING ON CLINICAL CONDITION.  Note: This dictation was prepared with Dragon dictation along with smaller phrase technology. Any transcriptional errors that result from this process are unintentional.   Ramonita Lab M.D on 11/30/2017 at 2:00 PM  Between 7am to 6pm - Pager - [phone] After 6pm go to [email or web address] - password [email or web address]  [facility]  Office  [phone]  CC: Primary care physician; Patient, No Pcp Per

## 2017-11-30 NOTE — Progress Notes (Signed)
Pt RR 35-40bpm.  She states that she is comfortable at this rate.  She is requesting to have the mask removed.  She agreed to "give it some more time."

## 2017-11-30 NOTE — Progress Notes (Addendum)
Inpatient Diabetes Program Recommendations  AACE/ADA: New Consensus Statement on Inpatient Glycemic Control (2015)  Target Ranges:  Prepandial:   less than 140 mg/dL      Peak postprandial:   less than 180 mg/dL (1-2 hours)      Critically ill patients:  140 - 180 mg/dL   Results for JUANTIA, HOLFORD (MRN [id number]) as of 11/30/2017 08:46  Ref. Range 11/29/2017 22:42 11/29/2017 23:57 11/30/2017 03:52 11/30/2017 07:52  Glucose-Capillary Latest Ref Range: 65 - 99 mg/dL 86 329 (H) 924 (H) 268 (H)     Home DM Meds: Toujeo 30 units daily        Novolog 25 units TID        Victoza 1.2 mg daily  Current Orders: Novolog Moderate Correction Scale/ SSI (0-15 units) Q4 hours        Patient started on Solumedrol 60 mg BID last night at 11pm.  CBGs now elevated likely due to steroids.  Patient takes long-acting Toujeo insulin at home.    MD- Please consider the following in-hospital insulin adjustments:  1. Start Lantus 22 units daily (75% total home dose)  2. Change Novolog SSI timing to TID AC + HS (patient getting solid PO diet)  3. Start Novolog Meal Coverage: Novolog 5 units TID with meals (hold if pt eats <50% of meal) [this would be 25% total home dose Novolog patient takes at home with meals]      --Will follow patient during hospitalization--  Ambrose Finland RN, MSN, CDE Diabetes Coordinator Inpatient Glycemic Control Team Team Pager: [phone] (8a-5p)

## 2017-11-30 NOTE — Care Management (Signed)
Patient frequents hospital. Per CSW previous involved with outpatient- CSW received word from [facility] paramedic that patient would like to disenroll from the paramedicine program.  Patient has history of HRI coverage with [facility] home health.  I have updated [place] with [place]. She is on chronic 4 L O2 at home.

## 2017-11-30 NOTE — Progress Notes (Addendum)
Pharmacy Antibiotic Note  Elizabeth Montes is a 62 y.o. female admitted on 11/29/2017 with pneumonia. Patient has had seven admissions in the previous six months and takes azathioprine.  Pharmacy has been consulted for cefepime dosing.  Plan: Will continue cefepime 2g IV Q12hr.   Vancomycin discontinued secondary to negative MRSA PCR.    Height: 5\' 3"  (160 cm) Weight: 179 lb 0.2 oz (81.2 kg) IBW/kg (Calculated) : 52.4  Temp (24hrs), Avg:98.4 F (36.9 C), Min:97.6 F (36.4 C), Max:98.9 F (37.2 C)  Recent Labs  Lab 11/29/17 1829 11/30/17 0032 11/30/17 0313 11/30/17 0509  WBC 17.2*  --   --  18.8*  CREATININE 0.75  --   --  0.75  LATICACIDVEN 3.2* 1.7 2.1*  --     Estimated Creatinine Clearance: 75.4 mL/min (by C-G formula based on SCr of 0.75 mg/dL).    Allergies  Allergen Reactions  . Lovenox [Enoxaparin Sodium] Other (See Comments)    "made me bleed"    Antimicrobials this admission: Vancomycin 5/6 >> 5/7  Cefepime 5/6  >>   Dose adjustments this admission: N/A  Microbiology results: 5/6 BCx: no growth < 12 hours  5/6 MRSA PCR: negative   Thank you for allowing pharmacy to be a part of this patient's care.  Simpson,Michael L 11/30/2017 6:55 PM

## 2017-11-30 NOTE — Progress Notes (Signed)
   11/30/17 0135  Vitals  Pulse Rate (!) 116  ECG Heart Rate (!) 115  Resp (!) 26  Oxygen Therapy  SpO2 (!) 78 %  O2 Device Room Air   Pt removed Bi-PAP demanding food despite her declining oxygen saturations.  I placed her on 4L [place] (home O2 dose) with sats in the mid 80s to low 90S.  Pt stated that she would not wear the BI-PAP unless we fed her.   Elizabeth Harman, NP called to bedside.  Spoke with the patient - Bi-PAP replaced.  Pt administered trazodone for sleep per order.

## 2017-11-30 NOTE — Progress Notes (Signed)
eLink Physician-Brief Progress Note Patient Name: IBET WAAG DOB: 09/27/1955 MRN: [id number]   Date of Service  11/30/2017  HPI/Events of Note  Lactic Acid = 3.2.  eICU Interventions  Continue to trend Lactic Acid levels.      Intervention Category Major Interventions: Acid-Base disturbance - evaluation and management  Sommer,Steven Eugene 11/30/2017, 12:23 AM

## 2017-12-01 LAB — GLUCOSE, CAPILLARY
GLUCOSE-CAPILLARY: 413 mg/dL — AB (ref 65–99)
GLUCOSE-CAPILLARY: 465 mg/dL — AB (ref 65–99)
GLUCOSE-CAPILLARY: 403 mg/dL — AB (ref 65–99)
GLUCOSE-CAPILLARY: 423 mg/dL — AB (ref 65–99)
Glucose-Capillary: 295 mg/dL — ABNORMAL HIGH (ref 65–99)
Glucose-Capillary: 299 mg/dL — ABNORMAL HIGH (ref 65–99)
Glucose-Capillary: 316 mg/dL — ABNORMAL HIGH (ref 65–99)
Glucose-Capillary: 349 mg/dL — ABNORMAL HIGH (ref 65–99)
Glucose-Capillary: 364 mg/dL — ABNORMAL HIGH (ref 65–99)
Glucose-Capillary: 374 mg/dL — ABNORMAL HIGH (ref 65–99)
Glucose-Capillary: 409 mg/dL — ABNORMAL HIGH (ref 65–99)
Glucose-Capillary: 432 mg/dL — ABNORMAL HIGH (ref 65–99)
Glucose-Capillary: 442 mg/dL — ABNORMAL HIGH (ref 65–99)

## 2017-12-01 MED ORDER — METHYLPREDNISOLONE SODIUM SUCC 40 MG IJ SOLR
40.0000 mg | Freq: Two times a day (BID) | INTRAMUSCULAR | Status: DC
  Administered 2017-12-01 – 2017-12-02 (×2): 40 mg via INTRAVENOUS
  Filled 2017-12-01 (×2): qty 1

## 2017-12-01 MED ORDER — INSULIN ASPART 100 UNIT/ML ~~LOC~~ SOLN
0.0000 [IU] | SUBCUTANEOUS | Status: DC
  Administered 2017-12-01: 05:00:00 9 [IU] via SUBCUTANEOUS
  Filled 2017-12-01: qty 1

## 2017-12-01 MED ORDER — INSULIN ASPART 100 UNIT/ML ~~LOC~~ SOLN
15.0000 [IU] | Freq: Three times a day (TID) | SUBCUTANEOUS | Status: DC
  Administered 2017-12-01 – 2017-12-03 (×5): 15 [IU] via SUBCUTANEOUS
  Filled 2017-12-01 (×5): qty 1

## 2017-12-01 MED ORDER — INSULIN GLARGINE 100 UNIT/ML ~~LOC~~ SOLN
10.0000 [IU] | Freq: Once | SUBCUTANEOUS | Status: AC
  Administered 2017-12-01: 10 [IU] via SUBCUTANEOUS
  Filled 2017-12-01: qty 0.1

## 2017-12-01 MED ORDER — SODIUM CHLORIDE 0.9 % IV SOLN
INTRAVENOUS | Status: AC
  Administered 2017-12-01: 15:00:00 via INTRAVENOUS

## 2017-12-01 MED ORDER — INSULIN ASPART 100 UNIT/ML ~~LOC~~ SOLN: 0.0000 [IU] | Freq: Three times a day (TID) | SUBCUTANEOUS | Status: DC

## 2017-12-01 MED ORDER — INSULIN ASPART 100 UNIT/ML ~~LOC~~ SOLN
10.0000 [IU] | Freq: Once | SUBCUTANEOUS | Status: AC
  Administered 2017-12-01: 10 [IU] via SUBCUTANEOUS
  Filled 2017-12-01: qty 1

## 2017-12-01 MED ORDER — SODIUM CHLORIDE 0.9 % IV SOLN
2.0000 g | Freq: Three times a day (TID) | INTRAVENOUS | Status: DC
  Administered 2017-12-01 – 2017-12-05 (×12): 2 g via INTRAVENOUS
  Filled 2017-12-01 (×14): qty 2

## 2017-12-01 MED ORDER — IPRATROPIUM-ALBUTEROL 0.5-2.5 (3) MG/3ML IN SOLN
3.0000 mL | Freq: Four times a day (QID) | RESPIRATORY_TRACT | Status: DC
  Administered 2017-12-01 – 2017-12-05 (×14): 3 mL via RESPIRATORY_TRACT
  Filled 2017-12-01 (×15): qty 3

## 2017-12-01 MED ORDER — INSULIN GLARGINE 100 UNIT/ML ~~LOC~~ SOLN
30.0000 [IU] | Freq: Every day | SUBCUTANEOUS | Status: DC
Start: 2017-12-02 — End: 2017-12-03
  Administered 2017-12-02: 30 [IU] via SUBCUTANEOUS
  Filled 2017-12-01 (×2): qty 0.3

## 2017-12-01 MED ORDER — INSULIN GLARGINE 100 UNIT/ML ~~LOC~~ SOLN
10.0000 [IU] | Freq: Once | SUBCUTANEOUS | Status: DC
  Filled 2017-12-01: qty 0.1

## 2017-12-01 MED ORDER — INSULIN GLARGINE 100 UNIT/ML ~~LOC~~ SOLN
30.0000 [IU] | Freq: Once | SUBCUTANEOUS | Status: AC
  Administered 2017-12-01: 13:00:00 30 [IU] via SUBCUTANEOUS
  Filled 2017-12-01: qty 0.3

## 2017-12-01 MED ORDER — SODIUM CHLORIDE 0.9 % IV SOLN
INTRAVENOUS | Status: DC
  Administered 2017-12-01: 14:00:00 via INTRAVENOUS

## 2017-12-01 MED ORDER — INSULIN ASPART 100 UNIT/ML ~~LOC~~ SOLN: 0.0000 [IU] | Freq: Every day | SUBCUTANEOUS | Status: DC

## 2017-12-01 NOTE — Progress Notes (Signed)
[facility] [place] at [facility]   PATIENT NAME: Elizabeth Montes    MR#:  [id number]  DATE OF BIRTH:  10/24/1955  SUBJECTIVE:  CHIEF COMPLAINT: Patient is feeling much better, still coughing and shortness of breath with exertion blood sugar is high today,  REVIEW OF SYSTEMS:  CONSTITUTIONAL: No fever, fatigue or weakness.  EYES: No blurred or double vision.  EARS, NOSE, AND THROAT: No tinnitus or ear pain.  RESPIRATORY:  cough, shortness of breath are better, min wheezing , no hemoptysis.  CARDIOVASCULAR: No chest pain, orthopnea, edema.  GASTROINTESTINAL: No nausea, vomiting, diarrhea or abdominal pain.  GENITOURINARY: No dysuria, hematuria.  ENDOCRINE: No polyuria, nocturia,  HEMATOLOGY: No anemia, easy bruising or bleeding SKIN: No rash or lesion. MUSCULOSKELETAL: No joint pain or arthritis.   NEUROLOGIC: No tingling, numbness, weakness.  PSYCHIATRY: No anxiety or depression.   DRUG ALLERGIES:   Allergies  Allergen Reactions  . Lovenox [Enoxaparin Sodium] Other (See Comments)    "made me bleed"    VITALS:  Blood pressure (!) 149/77, pulse (!) 107, temperature (!) 97.5 F (36.4 C), temperature source Oral, resp. rate 18, height 5\' 3"  (1.6 m), weight 82.2 kg (181 lb 3.5 oz), SpO2 92 %.  PHYSICAL EXAMINATION:  GENERAL:  62 y.o.-year-old patient lying in the bed with no acute distress.  EYES: Pupils equal, round, reactive to light and accommodation. No scleral icterus. Extraocular muscles intact.  HEENT: Head atraumatic, normocephalic. Oropharynx and nasopharynx clear.  NECK:  Supple, no jugular venous distention. No thyroid enlargement, no tenderness.  LUNGS: mod breath sounds bilaterally, min wheezing, no rales,rhonchi or crepitation. No use of accessory muscles of respiration.  CARDIOVASCULAR: S1, S2 normal. No murmurs, rubs, or gallops.  ABDOMEN: Soft, nontender, nondistended. Bowel sounds present. No organomegaly or mass.  EXTREMITIES: No pedal  edema, cyanosis, or clubbing.  NEUROLOGIC: Cranial nerves II through XII are intact. Muscle strength global weakness in all extremities. Sensation intact. Gait not checked.  PSYCHIATRIC: The patient is alert and oriented x 3.  SKIN: No obvious rash, lesion, or ulcer.    LABORATORY PANEL:   CBC Recent Labs  Lab 11/30/17 0509  WBC 18.8*  HGB 9.6*  HCT 29.7*  PLT 415   ------------------------------------------------------------------------------------------------------------------  Chemistries  Recent Labs  Lab 11/29/17 1829 11/30/17 0509  NA 137 135  K 2.8* 3.9  CL 102 103  CO2 23 22  GLUCOSE 83 252*  BUN 12 14  CREATININE 0.75 0.75  CALCIUM 7.7* 7.1*  AST 63*  --   ALT 37  --   ALKPHOS 115  --   BILITOT 1.1  --    ------------------------------------------------------------------------------------------------------------------  Cardiac Enzymes Recent Labs  Lab 11/30/17 0509  TROPONINI 0.03*   ------------------------------------------------------------------------------------------------------------------  RADIOLOGY:  Dg Chest Port 1 View  Result Date: 11/29/2017 CLINICAL DATA:  Respiratory distress EXAM: PORTABLE CHEST 1 VIEW COMPARISON:  10/31/2017 FINDINGS: Borderline cardiomegaly. Low lung volumes. Predominately basilar and peripheral interstitial lung disease is stable. Left upper extremity PICC tip in the upper SVC. No pneumothorax. IMPRESSION: Basilar interstitial lung disease is stable. Left upper extremity PICC is stable. Electronically Signed   By: Jolaine Click M.D.   On: 11/29/2017 18:44    EKG:   Orders placed or performed during the hospital encounter of 11/29/17  . EKG 12-Lead  . EKG 12-Lead  . ED EKG 12-Lead  . ED EKG 12-Lead    ASSESSMENT AND PLAN:   * Bibasilar pneumonia with sepsis and acute on chronic  hypoxic respiratory failure Slowly clinically improving  incentive spirometry Multiple recent hospital stays and also on  immunosuppressants Off bipap - taper IV steroids, cefepime -MRSA PCR is negative, blood cultures are negative so far.  Discontinue vancomycin - Scheduled Nebulizers - Inhalers -Wean O2 as tolerated - pulmonary is seeing pt   *Diabetes mellitus.  With hyperglycemia Tapering steroids  Add sliding scale insulin.  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  *Chronic diastolic congestive heart failure.  Fluid resuscitation for sepsis underway.  Monitor for fluid overload.   * weakness -PT    All the records are reviewed and case discussed with Care Management/Social Workerr. Management plans discussed with the patient, family and they are in agreement.  CODE STATUS: fc  TOTAL TIME TAKING CARE OF THIS PATIENT:  33 minutes.   POSSIBLE D/C IN 2-3 DAYS, DEPENDING ON CLINICAL CONDITION.  Note: This dictation was prepared with Dragon dictation along with smaller phrase technology. Any transcriptional errors that result from this process are unintentional.   Ramonita Lab M.D on 12/01/2017 at 2:35 PM  Between 7am to 6pm - Pager - [phone] After 6pm go to [email or web address] - password [email or web address]  [facility]  Office  [phone]  CC: Primary care physician; Patient, No Pcp Per

## 2017-12-01 NOTE — Progress Notes (Signed)
Inpatient Diabetes Program Recommendations  AACE/ADA: New Consensus Statement on Inpatient Glycemic Control (2019)  Target Ranges:  Prepandial:   less than 140 mg/dL      Peak postprandial:   less than 180 mg/dL (1-2 hours)      Critically ill patients:  140 - 180 mg/dL   Results for Elizabeth Montes, Elizabeth Montes (MRN [id number]) as of 12/01/2017 10:35  Ref. Range 11/30/2017 07:52 11/30/2017 12:13 11/30/2017 17:48 11/30/2017 19:56 12/01/2017 00:04 12/01/2017 04:08 12/01/2017 07:51  Glucose-Capillary Latest Ref Range: 65 - 99 mg/dL 379 (H) 024 (H) 097 (H) 326 (H) 295 (H) 364 (H) 316 (H)   Review of Glycemic Control  Diabetes history: DM2 Outpatient Diabetes medications: Toujeo 30 units daily, Novolog 25 units TID with meals, Victoza 1.2 mg daily Current orders for Inpatient glycemic control: Lantus 20 units daily, Novolog 0-15 units TID with meals, Novolog 0-5 units QHS, Novolog 5 units TID with meals for meal coverage; Solumedrol 60 mg Q12H  Inpatient Diabetes Program Recommendations:  Insulin - Basal: If steroids are continued as ordered, please consider increasing Lantus to 30 units daily. Insulin - Meal Coverage: If steroids are continued as ordered, please consider increasing meal coverage to Novolog 15 units TID with meals.  Thanks, Orlando Penner, RN, MSN, CDE Diabetes Coordinator Inpatient Diabetes Program [phone] (Team Pager from 8am to 5pm)

## 2017-12-01 NOTE — Progress Notes (Addendum)
Patient refused neb this morning, RN called for patient to have a PRN dose due to increased WOB and him having to increase oxygen to 5l. Patient tol treatment well with no complications and WOB improved with neb treatment. Oxygen decreased to 4l.

## 2017-12-01 NOTE — Progress Notes (Signed)
Pharmacy Antibiotic Note  Elizabeth Montes is a 62 y.o. female admitted on 11/29/2017 with pneumonia. Patient has had seven admissions in the previous six months and takes azathioprine.  Pharmacy has been consulted for cefepime dosing.  Plan: Will increase cefepime to 2 g iv q 8 hours.   Height: 5\' 3"  (160 cm) Weight: 181 lb 3.5 oz (82.2 kg) IBW/kg (Calculated) : 52.4  Temp (24hrs), Avg:97.6 F (36.4 C), Min:97.5 F (36.4 C), Max:97.8 F (36.6 C)  Recent Labs  Lab 11/29/17 1829 11/30/17 0032 11/30/17 0313 11/30/17 0509  WBC 17.2*  --   --  18.8*  CREATININE 0.75  --   --  0.75  LATICACIDVEN 3.2* 1.7 2.1*  --     Estimated Creatinine Clearance: 75.9 mL/min (by C-G formula based on SCr of 0.75 mg/dL).    Allergies  Allergen Reactions  . Lovenox [Enoxaparin Sodium] Other (See Comments)    "made me bleed"    Antimicrobials this admission: Vancomycin 5/6 >> 5/7  Cefepime 5/6  >>   Dose adjustments this admission: N/A  Microbiology results: 5/6 BCx: NGTD 5/6 MRSA PCR: negative   Thank you for allowing pharmacy to be a part of this patient's care.  Luisa Hart D 12/01/2017 3:08 PM

## 2017-12-01 NOTE — Plan of Care (Addendum)
Dr. text to inform of Critical BS 442. Verbal orders given to give max sliding scale plus base insulin (TTL 20U).  Also 1x dose of 10U (TTL 30U for the day).  Insulin given and re-checking BS in 1 hour.  Recheck was 413. Dr. Informed and gave verbal to re-check in 1 hour. Staff will continue to assess. Dr. Georgina Snell continuous at 50hr, changed NS continous at 75hr. No further orders at this time  Recheck was 465.  Dr. Text to inform of Critical BS. NS increased to 100hr. No further insulin orders at this time. Will re-check in 1 hr.  Recheck was 409. Dr. Informed.  Recheck was 403. Max sliding scale given and 15U base novolog (TTL 30U novolog).  Recheck was 423.  Dr. Esaw Grandchild informed for possible orders. Asked to continue to monitor and re-check in 1 hour d/t patient needing to metabolize the 30U Novolog given, before attempting to treat further.  Recheck 432. Dr. Text to inform. Returned call, orders 10U Lantus and 10U novolog. Will re-check in 1 hr.  Re-check 374. Dr. Text to inform.

## 2017-12-02 ENCOUNTER — Inpatient Hospital Stay: Payer: Medicare HMO

## 2017-12-02 ENCOUNTER — Institutional Professional Consult (permissible substitution): Payer: Medicare HMO | Admitting: Internal Medicine

## 2017-12-02 LAB — GLUCOSE, CAPILLARY
GLUCOSE-CAPILLARY: 330 mg/dL — AB (ref 65–99)
GLUCOSE-CAPILLARY: 458 mg/dL — AB (ref 65–99)
GLUCOSE-CAPILLARY: 333 mg/dL — AB (ref 65–99)
Glucose-Capillary: 247 mg/dL — ABNORMAL HIGH (ref 65–99)
Glucose-Capillary: 307 mg/dL — ABNORMAL HIGH (ref 65–99)
Glucose-Capillary: 298 mg/dL — ABNORMAL HIGH (ref 65–99)
Glucose-Capillary: 350 mg/dL — ABNORMAL HIGH (ref 65–99)
Glucose-Capillary: 305 mg/dL — ABNORMAL HIGH (ref 65–99)

## 2017-12-02 MED ORDER — SODIUM CHLORIDE 0.9 % IV SOLN
INTRAVENOUS | Status: DC
  Administered 2017-12-02: 13:00:00 via INTRAVENOUS

## 2017-12-02 MED ORDER — SODIUM CHLORIDE 0.9 % IV BOLUS
500.0000 mL | Freq: Once | INTRAVENOUS | Status: AC
  Administered 2017-12-02: 500 mL via INTRAVENOUS

## 2017-12-02 MED ORDER — PREDNISONE 50 MG PO TABS
50.0000 mg | ORAL_TABLET | Freq: Every day | ORAL | Status: DC
  Administered 2017-12-02 – 2017-12-05 (×4): 50 mg via ORAL
  Filled 2017-12-02 (×4): qty 1

## 2017-12-02 NOTE — Plan of Care (Signed)

## 2017-12-02 NOTE — NC FL2 (Signed)
[place] MEDICAID FL2 LEVEL OF CARE SCREENING TOOL     IDENTIFICATION  Patient Name: Elizabeth Montes Birthdate: 1956/02/04 Sex: female Admission Date (Current Location): 11/29/2017  [facility] and [place] Number:  [address] ([id number]) Facility and Address:  [facility], [address] [place], [place] [address]      Provider Number: [id number]  Attending Physician Name and Address:  Ramonita Lab, MD  Relative Name and Phone Number:       Current Level of Care: Hospital Recommended Level of Care: Skilled Nursing Facility Prior Approval Number:    Date Approved/Denied:   PASRR Number: [id number])  Discharge Plan: SNF    Current Diagnoses: Patient Active Problem List   Diagnosis Date Noted  . Pneumonia 11/29/2017  . Acute on chronic respiratory failure with hypoxemia (HCC) 10/31/2017  . CHF (congestive heart failure) (HCC) 10/19/2017  . Acute kidney injury (HCC) 10/19/2017  . Pulmonary fibrosis (HCC) 10/19/2017  . Pulmonary hypertension 10/19/2017  . Obesity 10/19/2017  . HLD (hyperlipidemia) 10/19/2017  . Diabetes mellitus without complication (HCC) 10/19/2017  . CAD (coronary artery disease) 10/19/2017  . Acute hypokalemia 10/19/2017  . HTN (hypertension) 10/19/2017  . Acute on chronic respiratory failure (HCC) 10/19/2017  . Hypoxemia   . Acute pulmonary edema (HCC)   . Acute exacerbation of COPD with asthma (HCC) 09/30/2017  . Pulmonary hypertension (HCC) 09/30/2017  . CKD stage 2 due to type 2 diabetes mellitus (HCC) 09/30/2017  . Diabetes mellitus type 2 in nonobese (HCC) 09/13/2017  . Acute respiratory failure (HCC) 09/13/2017  . COPD exacerbation (HCC) 08/11/2017  . COPD with acute exacerbation (HCC) 08/11/2017  . Acute respiratory failure with hypoxia (HCC) 08/01/2017  . Healthcare-associated pneumonia 08/01/2017  . Diabetes mellitus (HCC) 08/01/2017  . Type 2 diabetes mellitus, uncontrolled (HCC) 07/16/2017  . Acute on chronic  respiratory failure with hypoxia (HCC) 07/16/2017  . COPD (chronic obstructive pulmonary disease) (HCC) 07/16/2017  . Hypertension 07/16/2017  . 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 Acute renal failure superimposed on stage 2 chronic kidney disease (HCC) 07/16/2017    Orientation RESPIRATION BLADDER Height & Weight     Self, Time, Situation, Place  O2(5 Liters Oxygen. ) Continent Weight: 181 lb 3.5 oz (82.2 kg) Height:  5\' 3"  (160 cm)  BEHAVIORAL SYMPTOMS/MOOD NEUROLOGICAL BOWEL NUTRITION STATUS      Continent Diet(Diet: Heart Healthy )  AMBULATORY STATUS COMMUNICATION OF NEEDS Skin   Extensive Assist Verbally Normal                       Personal Care Assistance Level of Assistance  Bathing, Feeding, Dressing Bathing Assistance: Limited assistance Feeding assistance: Independent Dressing Assistance: Limited assistance     Functional Limitations Info  Sight, Hearing, Speech Sight Info: Adequate Hearing Info: Adequate Speech Info: Adequate    SPECIAL CARE FACTORS FREQUENCY  PT (By licensed PT), OT (By licensed OT)     PT Frequency: (5) OT Frequency: (5)            Contractures      Additional Factors Info  Code Status, Allergies, Isolation Precautions Code Status Info: (Full Code. ) Allergies Info: (Lovenox Enoxaparin Sodium)     Isolation Precautions Info: (MRSA Nasal Swab. )     Current Medications (12/02/2017):  This is the current hospital active medication list Current Facility-Administered Medications  Medication Dose Route Frequency Provider Last Rate Last Dose  . acetaminophen (TYLENOL) tablet 650 mg  650 mg Oral Q6H PRN Sudini, Forensic scientist,  MD       Or  . acetaminophen (TYLENOL) suppository 650 mg  650 mg Rectal Q6H PRN Sudini, Wardell Heath, MD      . albuterol (PROVENTIL) (2.5 MG/3ML) 0.083% nebulizer solution 2.5 mg  2.5 mg Nebulization Q2H PRN Milagros Loll, MD   2.5 mg at 12/01/17 1006  . budesonide (PULMICORT) nebulizer solution 0.25 mg  0.25 mg  Nebulization BID Eugenie Norrie, NP   0.25 mg at 12/02/17 0820  . ceFEPIme (MAXIPIME) 2 g in sodium chloride 0.9 % 100 mL IVPB  2 g Intravenous Tildon Husky, MD   Stopped at 12/02/17 0903  . furosemide (LASIX) injection 40 mg  40 mg Intravenous Daily Conforti, John, DO   40 mg at 12/02/17 0837  . heparin injection 5,000 Units  5,000 Units Subcutaneous Q8H Eugenie Norrie, NP   5,000 Units at 12/02/17 1549  . HYDROcodone-acetaminophen (NORCO/VICODIN) 5-325 MG per tablet 1-2 tablet  1-2 tablet Oral Q4H PRN Sudini, Srikar, MD      . insulin aspart (novoLOG) injection 0-15 Units  0-15 Units Subcutaneous TID WC Conforti, John, DO   11 Units at 12/02/17 1736  . insulin aspart (novoLOG) injection 0-5 Units  0-5 Units Subcutaneous QHS Conforti, John, DO   4 Units at 12/01/17 2203  . insulin aspart (novoLOG) injection 15 Units  15 Units Subcutaneous TID WC Gouru, Aruna, MD   15 Units at 12/02/17 1736  . insulin glargine (LANTUS) injection 30 Units  30 Units Subcutaneous Daily Ramonita Lab, MD   30 Units at 12/02/17 6808  . ipratropium-albuterol (DUONEB) 0.5-2.5 (3) MG/3ML nebulizer solution 3 mL  3 mL Nebulization Q6H Gouru, Aruna, MD   3 mL at 12/02/17 1335  . MEDLINE mouth rinse  15 mL Mouth Rinse BID Gouru, Aruna, MD   15 mL at 12/02/17 0902  . morphine 2 MG/ML injection 1-2 mg  1-2 mg Intravenous Q4H PRN Eugenie Norrie, NP   1 mg at 12/02/17 0859  . ondansetron (ZOFRAN) tablet 4 mg  4 mg Oral Q6H PRN Milagros Loll, MD       Or  . ondansetron (ZOFRAN) injection 4 mg  4 mg Intravenous Q6H PRN Sudini, Srikar, MD      . polyethylene glycol (MIRALAX / GLYCOLAX) packet 17 g  17 g Oral Daily PRN Sudini, Srikar, MD      . predniSONE (DELTASONE) tablet 50 mg  50 mg Oral Q breakfast Gouru, Aruna, MD   50 mg at 12/02/17 1549  . sodium chloride flush (NS) 0.9 % injection 10-40 mL  10-40 mL Intracatheter PRN Sudini, Srikar, MD      . sodium chloride flush (NS) 0.9 % injection 3 mL  3 mL Intravenous Q12H  Milagros Loll, MD   3 mL at 12/02/17 0901  . traZODone (DESYREL) tablet 50 mg  50 mg Oral QHS PRN Eugenie Norrie, NP   50 mg at 11/30/17 2128     Discharge Medications: Please see discharge summary for a list of discharge medications.  Relevant Imaging Results:  Relevant Lab Results:   Additional Information (SSN: [id number])  Shigeo Baugh, Darleen Crocker, LCSW

## 2017-12-02 NOTE — Clinical Social Work Note (Signed)
Clinical Social Work Assessment  Patient Details  Name: Elizabeth Montes MRN: [id number] Date of Birth: [birth year]  Date of referral:  12/02/17               Reason for consult:  Facility Placement                Permission sought to share information with:    Permission granted to share information::     Name::        Agency::     Relationship::     Contact Information:     Housing/Transportation Living arrangements for the past 2 months:  Single Family Home Source of Information:  Patient Patient Interpreter Needed:  None Criminal Activity/Legal Involvement Pertinent to Current Situation/Hospitalization:  No - Comment as needed Significant Relationships:  Adult Children, Siblings Lives with:  Siblings Do you feel safe going back to the place where you live?  Yes Need for family participation in patient care:  Yes (Comment)  Care giving concerns:  Patient lives with her sister in [place], [place].    Social Worker assessment / plan:  Holiday representative (CSW) reviewed chart and noted that PT is recommending SNF. CSW met with patient alone at bedside to discuss D/C plan. Patient was alert and oriented X4 and was sitting up in the bed. Per patient she came to visit her daughter in [place] for 3 months and is planning on returning back to her home with her sister in [place], [place]. Per patient her sister is coming to [facility] tomorrow and she will ride home with her on Sunday. Per patient it is a 4 hour drive. Patient reported that she has oxygen at home already. Patient requested a hospital bed order. CSW sent RN case manager a message making her aware of above. CSW discussed SNF placement and that [employer] would have to approve it. Per patient she is not going to SNF and will be going home. Patient reported that she has been to a SNF in [place] before for 21 days and did not like it. Patient reported that she has a plan to continue PT at home in [place]. CSW will continue to follow and assist as  needed.    Employment status:  Disabled (Comment on whether or not currently receiving Disability) Insurance information:  Managed Medicare PT Recommendations:  [place] / Referral to community resources:  Other (Comment Required)(Patient refused SNF. )  Patient/Family's Response to care:  Patient refused SNF.   Patient/Family's Understanding of and Emotional Response to Diagnosis, Current Treatment, and Prognosis:  Patient was pleasant and thanked CSW for assistance.   Emotional Assessment Appearance:  Appears stated age Attitude/Demeanor/Rapport:    Affect (typically observed):  Accepting, Adaptable, Pleasant Orientation:  Oriented to Self, Oriented to  Time, Oriented to Place, Oriented to Situation Alcohol / Substance use:  Not Applicable Psych involvement (Current and /or in the community):  No (Comment)  Discharge Needs  Concerns to be addressed:  Discharge Planning Concerns Readmission within the last 30 days:  No Current discharge risk:  Dependent with Mobility, Chronically ill Barriers to Discharge:  Continued Medical Work up   [employer], Veronia Beets, LCSW 12/02/2017, 6:08 PM

## 2017-12-02 NOTE — Care Management Important Message (Signed)
Important Message  Patient Details  Name: Elizabeth Montes MRN: [id number] Date of Birth: 1956/01/02   Medicare Important Message Given:  Yes    Olegario Messier A Andrej Spagnoli 12/02/2017, 11:52 AM

## 2017-12-02 NOTE — Evaluation (Signed)
Physical Therapy Evaluation Patient Details Name: Elizabeth Montes MRN: [id number] DOB: 11-30-1955 Today's Date: 12/02/2017   History of Present Illness  62 y.o. female with a PMHx of pulmonary HTN; pulmonary fibrosis; polymyositis; HTN; HLD; DM; COPD; CAD with stent; and CHF.  She is here with pneumonia and shortness of breath.  Clinical Impression  Pt very limited with what she was able and willing to do today.  She reports that earlier she got up to [facility] and O2 dropped dramatically (57%!?) with the effort and is unwilling to try any mobility until she is breathing better.  She was on 5 liters t/o the PT exam and never exceeded 90% and dropped to the low 80s with very conservative supine bed exercises.  She showed general weakness t/o U&LEs but actually was able to perform lightly resisted exercises (did need consistent rest breaks after just a few reps).  Floated idea of maybe needing STR secondary to weakness, breathing issues and likely minimal activity in the next few days and she responded that she IS going to be driving back to [place] (where she lived last year before moving here with daughter but has been unable to find a doctor) on Sunday and that she is hoping she is feeling well enough to drive 4 hours...  PT encouraged her strongly to be open to the idea that she may not been safe to do this in just 2 days considering her current status.  Pt refusing STR but will at least need HHPT on discharge depending on the remainder of her hospital course.    Follow Up Recommendations SNF(pt unequivocally refuses, will need some form of PT & assist)    Equipment Recommendations       Recommendations for Other Services       Precautions / Restrictions Precautions Precautions: Fall Restrictions Weight Bearing Restrictions: No      Mobility  Bed Mobility               General bed mobility comments: Pt refused to do any mobility, reports that earlier this AM she got to [facility] and very quickly  became very fatigued (states O2 sats dropped to 50s?)  Transfers                    Ambulation/Gait                Stairs            Wheelchair Mobility    Modified Rankin (Stroke Patients Only)       Balance                                             Pertinent Vitals/Pain Pain Assessment: No/denies pain    Home Living Family/patient expects to be discharged to:: Private residence Living Arrangements: Children(living w/ daughter, plans to return to [place] with sister 5/12) Available Help at Discharge: Family;Available PRN/intermittently Type of Home: Apartment Home Access: Level entry     Home Layout: Two level;Able to live on main level with bedroom/bathroom;Bed/bath upstairs Home Equipment: Walker - 2 wheels;Cane - single point;Wheelchair - [employer];Hospital bed;Adaptive equipment;Tub bench(O2 tanks, compressor)      Prior Function Level of Independence: Independent               Hand Dominance        Extremity/Trunk Assessment  Upper Extremity Assessment Upper Extremity Assessment: Generalized weakness    Lower Extremity Assessment Lower Extremity Assessment: Generalized weakness       Communication   Communication: No difficulties  Cognition Arousal/Alertness: Awake/alert Behavior During Therapy: WFL for tasks assessed/performed Overall Cognitive Status: Within Functional Limits for tasks assessed                                        General Comments      Exercises General Exercises - Lower Extremity Ankle Circles/Pumps: AROM;10 reps Heel Slides: Strengthening;5 reps Hip ABduction/ADduction: Strengthening;5 reps   Assessment/Plan    PT Assessment Patient needs continued PT services  PT Problem List Decreased strength;Decreased range of motion;Decreased activity tolerance;Decreased balance;Decreased mobility;Decreased coordination;Decreased knowledge of use of  DME;Decreased safety awareness;Cardiopulmonary status limiting activity;Decreased knowledge of precautions       PT Treatment Interventions DME instruction;Gait training;Stair training;Functional mobility training;Therapeutic activities;Therapeutic exercise;Balance training;Neuromuscular re-education;Patient/family education    PT Goals (Current goals can be found in the Care Plan section)  Acute Rehab PT Goals Patient Stated Goal: get well enough to drive 4 hrs with sister to [place] on Sunday (5/12) PT Goal Formulation: With patient Time For Goal Achievement: 12/16/17 Potential to Achieve Goals: Fair    Frequency Min 2X/week   Barriers to discharge        Co-evaluation               AM-PAC PT "6 Clicks" Daily Activity  Outcome Measure Difficulty turning over in bed (including adjusting bedclothes, sheets and blankets)?: A Lot Difficulty moving from lying on back to sitting on the side of the bed? : A Lot Difficulty sitting down on and standing up from a chair with arms (e.g., wheelchair, bedside commode, etc,.)?: Unable Help needed moving to and from a bed to chair (including a wheelchair)?: Total Help needed walking in hospital room?: Total Help needed climbing 3-5 steps with a railing? : Total 6 Click Score: 8    End of Session Equipment Utilized During Treatment: Gait belt Activity Tolerance: Patient limited by fatigue Patient left: with bed alarm set;with call bell/phone within reach   PT Visit Diagnosis: Muscle weakness (generalized) (M62.81);Difficulty in walking, not elsewhere classified (R26.2)    Time: 6010-9323 PT Time Calculation (min) (ACUTE ONLY): 24 min   Charges:   PT Evaluation $PT Eval Low Complexity: 1 Low     PT G CodesMalachi Pro, DPT 12/02/2017, 2:02 PM

## 2017-12-02 NOTE — Progress Notes (Signed)
[facility] [place] at [facility]   PATIENT NAME: Elizabeth Montes    MR#:  [id number]  DATE OF BIRTH:  February 15, 1956  SUBJECTIVE:  CHIEF COMPLAINT: Patient is requiring more oxygen today currently on 5 L of oxygen.  He lives on 3 L at home, still coughing and shortness of breath with exertion blood sugar is high today,  REVIEW OF SYSTEMS:  CONSTITUTIONAL: No fever, fatigue or weakness.  EYES: No blurred or double vision.  EARS, NOSE, AND THROAT: No tinnitus or ear pain.  RESPIRATORY:  cough, shortness of breath are better, min wheezing , no hemoptysis.  CARDIOVASCULAR: No chest pain, orthopnea, edema.  GASTROINTESTINAL: No nausea, vomiting, diarrhea or abdominal pain.  GENITOURINARY: No dysuria, hematuria.  ENDOCRINE: No polyuria, nocturia,  HEMATOLOGY: No anemia, easy bruising or bleeding SKIN: No rash or lesion. MUSCULOSKELETAL: No joint pain or arthritis.   NEUROLOGIC: No tingling, numbness, weakness.  PSYCHIATRY: No anxiety or depression.   DRUG ALLERGIES:   Allergies  Allergen Reactions  . Lovenox [Enoxaparin Sodium] Other (See Comments)    "made me bleed"    VITALS:  Blood pressure (!) 162/74, pulse (!) 108, temperature 97.6 F (36.4 C), temperature source Oral, resp. rate 20, height 5\' 3"  (1.6 m), weight 82.2 kg (181 lb 3.5 oz), SpO2 92 %.  PHYSICAL EXAMINATION:  GENERAL:  62 y.o.-year-old patient lying in the bed with no acute distress.  EYES: Pupils equal, round, reactive to light and accommodation. No scleral icterus. Extraocular muscles intact.  HEENT: Head atraumatic, normocephalic. Oropharynx and nasopharynx clear.  NECK:  Supple, no jugular venous distention. No thyroid enlargement, no tenderness.  LUNGS: mod breath sounds bilaterally, min wheezing, no rales,rhonchi or crepitation. No use of accessory muscles of respiration.  CARDIOVASCULAR: S1, S2 normal. No murmurs, rubs, or gallops.  ABDOMEN: Soft, nontender, nondistended. Bowel sounds  present. No organomegaly or mass.  EXTREMITIES: No pedal edema, cyanosis, or clubbing.  NEUROLOGIC: Cranial nerves II through XII are intact. Muscle strength global weakness in all extremities. Sensation intact. Gait not checked.  PSYCHIATRIC: The patient is alert and oriented x 3.  SKIN: No obvious rash, lesion, or ulcer.    LABORATORY PANEL:   CBC Recent Labs  Lab 11/30/17 0509  WBC 18.8*  HGB 9.6*  HCT 29.7*  PLT 415   ------------------------------------------------------------------------------------------------------------------  Chemistries  Recent Labs  Lab 11/29/17 1829 11/30/17 0509  NA 137 135  K 2.8* 3.9  CL 102 103  CO2 23 22  GLUCOSE 83 252*  BUN 12 14  CREATININE 0.75 0.75  CALCIUM 7.7* 7.1*  AST 63*  --   ALT 37  --   ALKPHOS 115  --   BILITOT 1.1  --    ------------------------------------------------------------------------------------------------------------------  Cardiac Enzymes Recent Labs  Lab 11/30/17 0509  TROPONINI 0.03*   ------------------------------------------------------------------------------------------------------------------  RADIOLOGY:  No results found.  EKG:   Orders placed or performed during the hospital encounter of 11/29/17  . EKG 12-Lead  . EKG 12-Lead  . ED EKG 12-Lead  . ED EKG 12-Lead    ASSESSMENT AND PLAN:   * Bibasilar pneumonia with sepsis and acute on chronic hypoxic respiratory failure Slowly clinically improving  incentive spirometry Multiple recent hospital stays and also on immunosuppressants Off bipap - taper IV steroids, cefepime -MRSA PCR is negative, blood cultures are negative so far.  Discontinue vancomycin - Scheduled Nebulizers - Inhalers -Wean O2 as tolerated, currently on 4 L of oxygen - pulmonary is seeing pt  -ABGs with  hypoxia -Repeat chest x-ray pending  *Acute urinary retention Foley catheter placed  *Diabetes mellitus.  With hyperglycemia Tapering steroids  Add  sliding scale insulin.  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  *Chronic diastolic congestive heart failure.  Fluid resuscitation for sepsis underway.  Monitor for fluid overload.   * weakness -PT -recommending skilled nursing facility   All the records are reviewed and case discussed with Care Management/Social Workerr. Management plans discussed with the patient, family and they are in agreement.  CODE STATUS: fc  TOTAL TIME TAKING CARE OF THIS PATIENT:  33 minutes.   POSSIBLE D/C IN 2-3 DAYS, DEPENDING ON CLINICAL CONDITION.  Note: This dictation was prepared with Dragon dictation along with smaller phrase technology. Any transcriptional errors that result from this process are unintentional.   Ramonita Lab M.D on 12/02/2017 at 3:42 PM  Between 7am to 6pm - Pager - [phone] After 6pm go to [email or web address] - password [email or web address]  [facility]  Office  [phone]  CC: Primary care physician; Patient, No Pcp Per

## 2017-12-03 DIAGNOSIS — I270 Primary pulmonary hypertension: Secondary | ICD-10-CM

## 2017-12-03 DIAGNOSIS — J84112 Idiopathic pulmonary fibrosis: Secondary | ICD-10-CM

## 2017-12-03 LAB — CBC
HEMATOCRIT: 29.9 % — AB (ref 35.0–47.0)
HEMOGLOBIN: 10.2 g/dL — AB (ref 12.0–16.0)
MCH: 27.9 pg (ref 26.0–34.0)
MCHC: 34.1 g/dL (ref 32.0–36.0)
MCV: 81.9 fL (ref 80.0–100.0)
Platelets: 420 10*3/uL (ref 150–440)
RBC: 3.65 MIL/uL — AB (ref 3.80–5.20)
RDW: 18.6 % — ABNORMAL HIGH (ref 11.5–14.5)
WBC: 24.8 10*3/uL — ABNORMAL HIGH (ref 3.6–11.0)

## 2017-12-03 LAB — BASIC METABOLIC PANEL
Anion gap: 7 (ref 5–15)
BUN: 28 mg/dL — AB (ref 6–20)
CHLORIDE: 104 mmol/L (ref 101–111)
CO2: 25 mmol/L (ref 22–32)
Calcium: 8.1 mg/dL — ABNORMAL LOW (ref 8.9–10.3)
Creatinine, Ser: 0.64 mg/dL (ref 0.44–1.00)
GFR calc Af Amer: >60 mL/min (ref >60)
GFR calc non Af Amer: >60 mL/min (ref >60)
Glucose, Bld: 252 mg/dL — ABNORMAL HIGH (ref 65–99)
Potassium: 3.5 mmol/L (ref 3.5–5.1)
SODIUM: 136 mmol/L (ref 135–145)

## 2017-12-03 LAB — GLUCOSE, CAPILLARY
GLUCOSE-CAPILLARY: 278 mg/dL — AB (ref 65–99)
GLUCOSE-CAPILLARY: 211 mg/dL — AB (ref 65–99)
GLUCOSE-CAPILLARY: 237 mg/dL — AB (ref 65–99)
Glucose-Capillary: 302 mg/dL — ABNORMAL HIGH (ref 65–99)
Glucose-Capillary: 259 mg/dL — ABNORMAL HIGH (ref 65–99)

## 2017-12-03 MED ORDER — INSULIN GLARGINE 100 UNIT/ML ~~LOC~~ SOLN
35.0000 [IU] | Freq: Every day | SUBCUTANEOUS | Status: DC
  Administered 2017-12-03 – 2017-12-05 (×3): 35 [IU] via SUBCUTANEOUS
  Filled 2017-12-03 (×4): qty 0.35

## 2017-12-03 MED ORDER — INSULIN ASPART 100 UNIT/ML ~~LOC~~ SOLN
20.0000 [IU] | Freq: Three times a day (TID) | SUBCUTANEOUS | Status: DC
  Administered 2017-12-03 – 2017-12-05 (×6): 20 [IU] via SUBCUTANEOUS
  Filled 2017-12-03 (×6): qty 1

## 2017-12-03 NOTE — Care Management (Signed)
Prescription given for hospital bed.

## 2017-12-03 NOTE — Progress Notes (Signed)
[facility] Consultation      Assessment and Plan:  Acute on chronic hypoxic respiratory failure with recurrent admission.  Severe Pulmonary fibrosis. 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  Poor Prognosis.   Discussed with patient, she has multiple severe and end-stage respiratory issues.  Per documentation in the chart the patient has been asked to follow-up with the advanced heart failure clinic, as well as the interstitial lung disease clinic, but has not been doing so.  Currently the patient is planning to move back to [place] and establish with new physicians there.  I strongly cautioned her on moving at the current time, in the presence of her sister, given her recurrent hospital admissions I suspect that she will be admitted again soon, and possibly when she is in the process of moving.  I again urged her to follow-up with the advanced heart failure clinic as well as the interstitial lung disease clinic for further potential initiation and/or titration of therapies.    Date: 12/03/2017  MRN# [id number] Elizabeth Montes 1955/08/12   Elizabeth Montes is a 62 y.o. old female seen in consultation for chief complaint of:    Chief Complaint  Patient presents with  . Respiratory Distress    HPI:   The patient is is a 62 year old female with a history of COPD, pulmonary hypertension, pulmonary fibrosis, steroid-dependent polymyositis, hypertension, dyslipidemia, morbid obesity.  Patient has been admitted to the hospital now 7 times thus far this year with respiratory issues, predominantly related to COPD.  She had previously been seen by Dr. Chase Caller in [place], in March of this year.  At that time she was noted to have presumptive interstitial lung disease with COPD, and predominantly Group 1 pulmonary hypertension, likely related to connective tissue disorder.  She was recommended to stay on 40 mg a day until further follow-up  in ILD clinic, and required follow-up in advanced heart failure clinic for pulmonary hypertension.  It was felt that her prognosis was poor.  Since that time she has not made it to the ILD or advanced heart failure clinic's clinic and has been recurrently admitted to the hospital.  On the current admission the patient presented to the hospital on 5/6, it was noted that she had oxygen saturation of 70% on her normal 3 L when seen by EMS initially.  She was placed on BiPAP in the ED with improvement of oxygen saturations she was also started on nebulizer treatments. I discussed her multiple admissions with the patient, she currently appears satisfied with returning to the hospital whenever she has breathing problems.  She appears to have poor understanding of the end-stage nature of her disease, as well as the fact that her condition is not going to get better by simply bouncing in and out of the hospital.  However she appears satisfied with this current state of affairs, and she appears to have no inclination to do anything to change it at this time.   **Imaging personally reviewed, CT chest 11/01/2017, significant emphysematous changes, worst in the apices.  There are bibasilar fibrotic changes. **Right heart catheterization 10/21/2017; mean pulmonary artery pressure 41, wedge pressure 4.  Cardiac index 2.5.  PVR equals 9.8 Woods units consistent with primary pulmonary hypertension **Echocardiogram 10/19/17; EF=55%. PAP=71  Results for Elizabeth Montes (MRN [id number]) as of 10/22/2017 11:12  Ref. Range 10/20/2017 14:54  ANA Ab, IFA Unknown Negative  ANCA Proteinase 3 Latest Ref Range: 0.0 - 3.5 U/mL <3.5  Anti JO-1 Latest Ref Range: 0.0 - 0.9 AI >8.0 (H)  CCP Antibodies IgG/IgA Latest Ref Range: 0 - 19 units 7  ds DNA Ab Latest Ref Range: 0 - 9 IU/mL <1  GBM Ab Latest Ref Range: 0 - 20 units 3  Myeloperoxidase Abs Latest Ref Range: 0.0 - 9.0 U/mL <9.0  RA Latex Turbid. Latest Ref Range: 0.0 - 13.9 IU/mL  11.4  Scleroderma (Scl-70) (ENA) Antibody, IgG Latest Ref Range: 0.0 - 0.9 AI <0.2       PMHX:   Past Medical History:  Diagnosis Date  . CAD (coronary artery disease) w/ stent 2017 ([place])   . CHF (congestive heart failure) (HCC)   . COPD (chronic obstructive pulmonary disease) (HCC)   . Diabetes mellitus without complication (HCC)   . Heart attack [facility]) 06/09/2016   06/09/2016 cath/PCI (inf STEMI): 60-70% proximal LAD, LCx normal, RCA mid 80-90% -> Rebel 3.5 x 32 mm BMS to mRCA and Rebel 3.5 x 28 mm BMS to proximal LAD   . HLD (hyperlipidemia)   . Hypertension   . Obesity   . Polymyositis (HCC)   . Pulmonary fibrosis (HCC)   . Pulmonary hypertension (HCC)    Surgical Hx:  Past Surgical History:  Procedure Laterality Date  . ABDOMINAL SURGERY    . RIGHT HEART CATH N/A 10/21/2017   Procedure: RIGHT HEART CATH;  Surgeon: Dolores Patty, MD;  Location: [facility] INVASIVE CV LAB;  Service: Cardiovascular;  Laterality: N/A;  . TRACHEOSTOMY     Family Hx:  Family History  Problem Relation Age of Onset  . Cancer Mother   . Stroke Brother   . Diabetes Sister   . Stroke Sister    Social Hx:   Social History   Tobacco Use  . Smoking status: Never Smoker  . Smokeless tobacco: Never Used  Substance Use Topics  . Alcohol use: No  . Drug use: No   Medication:    Current Facility-Administered Medications:  .  acetaminophen (TYLENOL) tablet 650 mg, 650 mg, Oral, Q6H PRN **OR** acetaminophen (TYLENOL) suppository 650 mg, 650 mg, Rectal, Q6H PRN, Sudini, Srikar, MD .  albuterol (PROVENTIL) (2.5 MG/3ML) 0.083% nebulizer solution 2.5 mg, 2.5 mg, Nebulization, Q2H PRN, Sudini, Srikar, MD, 2.5 mg at 12/01/17 1006 .  budesonide (PULMICORT) nebulizer solution 0.25 mg, 0.25 mg, Nebulization, BID, Eugenie Norrie, NP, 0.25 mg at 12/03/17 0853 .  ceFEPIme (MAXIPIME) 2 g in sodium chloride 0.9 % 100 mL IVPB, 2 g, Intravenous, Q8H, Gouru, Aruna, MD, Stopped at 12/03/17 0849 .  furosemide  (LASIX) injection 40 mg, 40 mg, Intravenous, Daily, Conforti, John, DO, 40 mg at 12/03/17 0957 .  heparin injection 5,000 Units, 5,000 Units, Subcutaneous, Q8H, Blakeney, Neldon Newport, NP, 5,000 Units at 12/03/17 0548 .  HYDROcodone-acetaminophen (NORCO/VICODIN) 5-325 MG per tablet 1-2 tablet, 1-2 tablet, Oral, Q4H PRN, Sudini, Srikar, MD .  insulin aspart (novoLOG) injection 0-15 Units, 0-15 Units, Subcutaneous, TID WC, Conforti, John, DO, 8 Units at 12/03/17 0813 .  insulin aspart (novoLOG) injection 0-5 Units, 0-5 Units, Subcutaneous, QHS, Conforti, John, DO, 4 Units at 12/02/17 2213 .  insulin aspart (novoLOG) injection 20 Units, 20 Units, Subcutaneous, TID WC, Gouru, Aruna, MD .  insulin glargine (LANTUS) injection 35 Units, 35 Units, Subcutaneous, Daily, Gouru, Aruna, MD, 35 Units at 12/03/17 1007 .  ipratropium-albuterol (DUONEB) 0.5-2.5 (3) MG/3ML nebulizer solution 3 mL, 3 mL, Nebulization, Q6H, Gouru, Aruna, MD, 3 mL at 12/03/17 0853 .  MEDLINE mouth rinse, 15 mL,  Mouth Rinse, BID, Gouru, Aruna, MD, 15 mL at 12/03/17 0817 .  morphine 2 MG/ML injection 1-2 mg, 1-2 mg, Intravenous, Q4H PRN, Awilda Bill, NP, 2 mg at 12/03/17 0330 .  ondansetron (ZOFRAN) tablet 4 mg, 4 mg, Oral, Q6H PRN **OR** ondansetron (ZOFRAN) injection 4 mg, 4 mg, Intravenous, Q6H PRN, Sudini, Srikar, MD .  polyethylene glycol (MIRALAX / GLYCOLAX) packet 17 g, 17 g, Oral, Daily PRN, Sudini, Srikar, MD .  predniSONE (DELTASONE) tablet 50 mg, 50 mg, Oral, Q breakfast, Gouru, Aruna, MD, 50 mg at 12/03/17 0817 .  sodium chloride flush (NS) 0.9 % injection 10-40 mL, 10-40 mL, Intracatheter, PRN, Sudini, Srikar, MD .  sodium chloride flush (NS) 0.9 % injection 3 mL, 3 mL, Intravenous, Q12H, Sudini, Srikar, MD, 3 mL at 12/03/17 0819 .  traZODone (DESYREL) tablet 50 mg, 50 mg, Oral, QHS PRN, Awilda Bill, NP, 50 mg at 11/30/17 2128   Allergies:  Lovenox [enoxaparin sodium]  Review of Systems: Gen:  Denies  fever, sweats,  chills HEENT: Denies blurred vision, double vision. bleeds, sore throat Cvc:  No dizziness, chest pain. Resp:   Denies cough or sputum production, shortness of breath Gi: Denies swallowing difficulty, stomach pain. Gu:  Denies bladder incontinence, burning urine Ext:   No Joint pain, stiffness. Skin: No skin rash,  hives  Endoc:  No polyuria, polydipsia. Psych: No depression, insomnia. Other:  All other systems were reviewed with the patient and were negative other that what is mentioned in the HPI.   Physical Examination:   VS: BP (!) 169/79 (BP Location: Right Arm)   Pulse (!) 101   Temp (!) 97.5 F (36.4 C) (Oral)   Resp 18   Ht 5\' 3"  (1.6 m)   Wt 187 lb (84.8 kg)   SpO2 91%   BMI 33.13 kg/m   General Appearance: No distress  Neuro:without focal findings,  speech normal,  HEENT: PERRLA, EOM intact.   Pulmonary: diffuse bilateral creps.  CardiovascularNormal S1,S2.  No m/r/g.   Abdomen: Benign, Soft, non-tender. Renal:  No costovertebral tenderness  GU:  No performed at this time. Endoc: No evident thyromegaly, no signs of acromegaly. Skin:   warm, no rashes, no ecchymosis  Extremities: normal, no cyanosis, clubbing.  Other findings:    LABORATORY PANEL:   CBC Recent Labs  Lab 12/03/17 0557  WBC 24.8*  HGB 10.2*  HCT 29.9*  PLT 420   ------------------------------------------------------------------------------------------------------------------  Chemistries  Recent Labs  Lab 11/29/17 1829  12/03/17 0557  NA 137   < > 136  K 2.8*   < > 3.5  CL 102   < > 104  CO2 23   < > 25  GLUCOSE 83   < > 252*  BUN 12   < > 28*  CREATININE 0.75   < > 0.64  CALCIUM 7.7*   < > 8.1*  AST 63*  --   --   ALT 37  --   --   ALKPHOS 115  --   --   BILITOT 1.1  --   --    < > = values in this interval not displayed.   ------------------------------------------------------------------------------------------------------------------  Cardiac Enzymes Recent Labs    Lab 11/30/17 0509  TROPONINI 0.03*   ------------------------------------------------------------  RADIOLOGY:  Dg Chest Port 1 View  Result Date: 12/02/2017 CLINICAL DATA:  Pulmonary hypertension, pulmonary fibrosis, polymyositis. Shortness of breath. Suspected pneumonia. EXAM: PORTABLE CHEST 1 VIEW COMPARISON:  11/29/2017 FINDINGS: Left PICC line terminates  in the expected location of the proximal superior vena cava. Cardiomediastinal silhouette is normal. Mediastinal contours appear intact. Calcific atherosclerotic disease of the aorta. There is no evidence of lobar airspace consolidation, pleural effusion or pneumothorax. Persistent lower lobe predominant interstitial lung disease and lung volumes. Osseous structures are without acute abnormality. Soft tissues are grossly normal. IMPRESSION: Persistent lower lobe predominant interstitial lung disease and low lung volumes. No evidence of lobar consolidation. Electronically Signed   By: Fidela Salisbury M.D.   On: 12/02/2017 16:25       Thank  you for the consultation and for allowing [facility] to assist in the care of your patient. Our recommendations are noted above.  Please contact us if we can be of further service.   Marda Stalker, MD.  Board Certified in Internal Medicine, Pulmonary Medicine, [place], and Sleep Medicine.  [facility] Office Number: [phone]  Patricia Pesa, M.D.  Merton Border, M.D  12/03/2017

## 2017-12-03 NOTE — Progress Notes (Signed)
[facility] - [place] at [facility]   PATIENT NAME: Elizabeth Montes    MR#:  [id number]  DATE OF BIRTH:  [birth year]  SUBJECTIVE:  CHIEF COMPLAINT: Patient is requiring more oxygen today currently on 5 L of oxygen.  She lives on 3 L at home, [place] desaturating whenever we are trying to wean her off the oxygen.  REVIEW OF SYSTEMS:  CONSTITUTIONAL: No fever, fatigue or weakness.  EYES: No blurred or double vision.  EARS, NOSE, AND THROAT: No tinnitus or ear pain.  RESPIRATORY:  cough, shortness of breath are better, min wheezing , no hemoptysis.  CARDIOVASCULAR: No chest pain, orthopnea, edema.  GASTROINTESTINAL: No nausea, vomiting, diarrhea or abdominal pain.  GENITOURINARY: No dysuria, hematuria.  ENDOCRINE: No polyuria, nocturia,  HEMATOLOGY: No anemia, easy bruising or bleeding SKIN: No rash or lesion. MUSCULOSKELETAL: No joint pain or arthritis.   NEUROLOGIC: No tingling, numbness, weakness.  PSYCHIATRY: No anxiety or depression.   DRUG ALLERGIES:   Allergies  Allergen Reactions  . Lovenox [Enoxaparin Sodium] Other (See Comments)    "made me bleed"    VITALS:  Blood pressure (!) 169/79, pulse (!) 101, temperature (!) 97.5 F (36.4 C), temperature source Oral, resp. rate 18, height 5\' 3"  (1.6 m), weight 84.8 kg (187 lb), SpO2 91 %.  PHYSICAL EXAMINATION:  GENERAL:  62 y.o.-year-old patient lying in the bed with no acute distress.  EYES: Pupils equal, round, reactive to light and accommodation. No scleral icterus. Extraocular muscles intact.  HEENT: Head atraumatic, normocephalic. Oropharynx and nasopharynx clear.  NECK:  Supple, no jugular venous distention. No thyroid enlargement, no tenderness.  LUNGS: mod breath sounds bilaterally, min wheezing, no rales,rhonchi or crepitation. No use of accessory muscles of respiration.  CARDIOVASCULAR: S1, S2 normal. No murmurs, rubs, or gallops.  ABDOMEN: Soft, nontender, nondistended. Bowel sounds present. No  organomegaly or mass.  EXTREMITIES: No pedal edema, cyanosis, or clubbing.  NEUROLOGIC: Cranial nerves II through XII are intact. Muscle strength global weakness in all extremities. Sensation intact. Gait not checked.  PSYCHIATRIC: The patient is alert and oriented x 3.  SKIN: No obvious rash, lesion, or ulcer.    LABORATORY PANEL:   CBC Recent Labs  Lab 12/03/17 0557  WBC 24.8*  HGB 10.2*  HCT 29.9*  PLT 420   ------------------------------------------------------------------------------------------------------------------  Chemistries  Recent Labs  Lab 11/29/17 1829  12/03/17 0557  NA 137   < > 136  K 2.8*   < > 3.5  CL 102   < > 104  CO2 23   < > 25  GLUCOSE 83   < > 252*  BUN 12   < > 28*  CREATININE 0.75   < > 0.64  CALCIUM 7.7*   < > 8.1*  AST 63*  --   --   ALT 37  --   --   ALKPHOS 115  --   --   BILITOT 1.1  --   --    < > = values in this interval not displayed.   ------------------------------------------------------------------------------------------------------------------  Cardiac Enzymes Recent Labs  Lab 11/30/17 0509  TROPONINI 0.03*   ------------------------------------------------------------------------------------------------------------------  RADIOLOGY:  Dg Chest Port 1 View  Result Date: 12/02/2017 CLINICAL DATA:  Pulmonary hypertension, pulmonary fibrosis, polymyositis. Shortness of breath. Suspected pneumonia. EXAM: PORTABLE CHEST 1 VIEW COMPARISON:  11/29/2017 FINDINGS: Left PICC line terminates in the expected location of the proximal superior vena cava. Cardiomediastinal silhouette is normal. Mediastinal contours appear intact. Calcific atherosclerotic disease of the  aorta. There is no evidence of lobar airspace consolidation, pleural effusion or pneumothorax. Persistent lower lobe predominant interstitial lung disease and lung volumes. Osseous structures are without acute abnormality. Soft tissues are grossly normal. IMPRESSION:  Persistent lower lobe predominant interstitial lung disease and low lung volumes. No evidence of lobar consolidation. Electronically Signed   By: Ted Mcalpine M.D.   On: 12/02/2017 16:25    EKG:   Orders placed or performed during the hospital encounter of 11/29/17  . EKG 12-Lead  . EKG 12-Lead  . ED EKG 12-Lead  . ED EKG 12-Lead    ASSESSMENT AND PLAN:   * Bibasilar pneumonia with sepsis and acute on chronic hypoxic respiratory failure Slowly clinically improving, desaturating with minimal exertion Pulmonology consult placed  incentive spirometry Multiple recent hospital stays and also on immunosuppressants Off bipap - taper IV steroids, cefepime -MRSA PCR is negative, blood cultures are negative so far.  Discontinue vancomycin - Scheduled Nebulizers, Inhalers -Wean O2 as tolerated, currently on 4 L of oxygen, chronically lives on 3 L of oxygen - pulmonary is seeing pt  -ABGs with hypoxia -Repeat chest x-ray pending  *Acute urinary retention Foley catheter placed, will do voiding trial  *Diabetes mellitus.  With hyperglycemia Tapering steroids  Add sliding scale insulin.  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  *Chronic diastolic congestive heart failure.  Fluid resuscitation for sepsis underway.  Monitor for fluid overload.   * weakness -PT -recommending skilled nursing facility, patient is refusing She wants to head back home in [place] on Sunday   All the records are reviewed and case discussed with Care Management/Social Workerr. Management plans discussed with the patient, family and they are in agreement.  CODE STATUS: fc  TOTAL TIME TAKING CARE OF THIS PATIENT:  33 minutes.   POSSIBLE D/C IN 2-3 DAYS, DEPENDING ON CLINICAL CONDITION.  Note: This dictation was prepared with Dragon dictation along with smaller phrase technology. Any transcriptional errors that result from this process are  unintentional.   Ramonita Lab M.D on 12/03/2017 at 1:00 PM  Between 7am to 6pm - Pager - [phone] After 6pm go to [email or web address] - password [email or web address]  [facility]  Office  [phone]  CC: Primary care physician; Patient, No Pcp Per

## 2017-12-03 NOTE — Progress Notes (Signed)
Inpatient Diabetes Program Recommendations  AACE/ADA: New Consensus Statement on Inpatient Glycemic Control (2019)  Target Ranges:  Prepandial:   less than 140 mg/dL      Peak postprandial:   less than 180 mg/dL (1-2 hours)      Critically ill patients:  140 - 180 mg/dL   Results for Elizabeth Montes, Elizabeth Montes (MRN [id number]) as of 12/03/2017 07:36  Ref. Range 12/02/2017 07:56 12/02/2017 11:54 12/02/2017 16:55 12/02/2017 21:51 12/03/2017 07:30  Glucose-Capillary Latest Ref Range: 65 - 99 mg/dL 121 (H) 624 (H) 469 (H) 333 (H) 278 (H)    Review of Glycemic Control  Diabetes history: DM2 Outpatient Diabetes medications: Toujeo 30 units daily, Novolog 25 units TID with meals, Victoza 1.2 mg daily Current orders for Inpatient glycemic control: Lantus 30 units daily, Novolog 0-15 units TID with meals, Novolog 0-5 units QHS, Novolog 15 units TID with meals for meal coverage; Prednisone 50 mg QAM  Inpatient Diabetes Program Recommendations:  Insulin - Basal: Please consider increasing Lantus to 35 units daily. Insulin - Meal Coverage: Please consider increasing meal coverage to Novolog 20 units TID with meals.  NOTE: Steroids decreased and changed to PO Prednisone on 12/02/17.   Thanks, Orlando Penner, RN, MSN, CDE Diabetes Coordinator Inpatient Diabetes Program [phone] (Team Pager from 8am to 5pm)

## 2017-12-04 LAB — CULTURE, BLOOD (ROUTINE X 2)
CULTURE: NO GROWTH
Special Requests: ADEQUATE

## 2017-12-04 LAB — GLUCOSE, CAPILLARY
GLUCOSE-CAPILLARY: 254 mg/dL — AB (ref 65–99)
Glucose-Capillary: 175 mg/dL — ABNORMAL HIGH (ref 65–99)
Glucose-Capillary: 200 mg/dL — ABNORMAL HIGH (ref 65–99)
Glucose-Capillary: 163 mg/dL — ABNORMAL HIGH (ref 65–99)

## 2017-12-04 NOTE — Plan of Care (Signed)
  Problem: Education: Goal: Knowledge of General Education information will improve 12/04/2017 0009 by Donnel Saxon, RN Outcome: Progressing 12/04/2017 0007 by Donnel Saxon, RN Outcome: Progressing   Problem: Health Behavior/Discharge Planning: Goal: Ability to manage health-related needs will improve 12/04/2017 0009 by Donnel Saxon, RN Outcome: Progressing 12/04/2017 0007 by Donnel Saxon, RN Outcome: Progressing   Problem: Clinical Measurements: Goal: Ability to maintain clinical measurements within normal limits will improve 12/04/2017 0009 by Donnel Saxon, RN Outcome: Progressing 12/04/2017 0007 by Donnel Saxon, RN Outcome: Progressing Goal: Will remain free from infection 12/04/2017 0009 by Donnel Saxon, RN Outcome: Progressing 12/04/2017 0007 by Donnel Saxon, RN Outcome: Progressing Goal: Diagnostic test results will improve 12/04/2017 0009 by Donnel Saxon, RN Outcome: Progressing 12/04/2017 0007 by Donnel Saxon, RN Outcome: Progressing Goal: Respiratory complications will improve 12/04/2017 0009 by Donnel Saxon, RN Outcome: Progressing 12/04/2017 0007 by Donnel Saxon, RN Outcome: Progressing Goal: Cardiovascular complication will be avoided 12/04/2017 0009 by Donnel Saxon, RN Outcome: Progressing 12/04/2017 0007 by Donnel Saxon, RN Outcome: Progressing   Problem: Activity: Goal: Risk for activity intolerance will decrease 12/04/2017 0009 by Donnel Saxon, RN Outcome: Progressing 12/04/2017 0007 by Donnel Saxon, RN Outcome: Progressing   Problem: Nutrition: Goal: Adequate nutrition will be maintained 12/04/2017 0009 by Donnel Saxon, RN Outcome: Progressing 12/04/2017 0007 by Donnel Saxon, RN Outcome: Progressing   Problem: Coping: Goal: Level of anxiety will decrease 12/04/2017 0009 by Donnel Saxon, RN Outcome:  Progressing 12/04/2017 0007 by Donnel Saxon, RN Outcome: Progressing   Problem: Elimination: Goal: Will not experience complications related to bowel motility 12/04/2017 0009 by Donnel Saxon, RN Outcome: Progressing 12/04/2017 0007 by Donnel Saxon, RN Outcome: Progressing Goal: Will not experience complications related to urinary retention 12/04/2017 0009 by Donnel Saxon, RN Outcome: Progressing 12/04/2017 0007 by Donnel Saxon, RN Outcome: Progressing   Problem: Pain Managment: Goal: General experience of comfort will improve 12/04/2017 0009 by Donnel Saxon, RN Outcome: Progressing 12/04/2017 0007 by Donnel Saxon, RN Outcome: Progressing   Problem: Safety: Goal: Ability to remain free from injury will improve 12/04/2017 0009 by Donnel Saxon, RN Outcome: Progressing 12/04/2017 0007 by Donnel Saxon, RN Outcome: Progressing   Problem: Skin Integrity: Goal: Risk for impaired skin integrity will decrease 12/04/2017 0009 by Donnel Saxon, RN Outcome: Progressing 12/04/2017 0007 by Donnel Saxon, RN Outcome: Progressing

## 2017-12-04 NOTE — Progress Notes (Signed)
[facility] Physicians - [place] at [facility]   PATIENT NAME: Elizabeth Montes    MR#:  [id number]  DATE OF BIRTH:  [birth year]  SUBJECTIVE:  CHIEF COMPLAINT: Patient is requiring 4-5 L of oxygen.  She lives on 3 L at home, [place] desaturating to 84-85 %whenever we are trying to ambulate pt   REVIEW OF SYSTEMS:  CONSTITUTIONAL: No fever, fatigue or weakness.  EYES: No blurred or double vision.  EARS, NOSE, AND THROAT: No tinnitus or ear pain.  RESPIRATORY:  cough, shortness of breath are better, min wheezing , no hemoptysis.  CARDIOVASCULAR: No chest pain, orthopnea, edema.  GASTROINTESTINAL: No nausea, vomiting, diarrhea or abdominal pain.  GENITOURINARY: No dysuria, hematuria.  ENDOCRINE: No polyuria, nocturia,  HEMATOLOGY: No anemia, easy bruising or bleeding SKIN: No rash or lesion. MUSCULOSKELETAL: No joint pain or arthritis.   NEUROLOGIC: No tingling, numbness, weakness.  PSYCHIATRY: No anxiety or depression.   DRUG ALLERGIES:   Allergies  Allergen Reactions  . Lovenox [Enoxaparin Sodium] Other (See Comments)    "made me bleed"    VITALS:  Blood pressure (!) 150/64, pulse 98, temperature 98.6 F (37 C), temperature source Oral, resp. rate 20, height 5\' 3"  (1.6 m), weight 84.1 kg (185 lb 8 oz), SpO2 92 %.  PHYSICAL EXAMINATION:  GENERAL:  62 y.o.-year-old patient lying in the bed with no acute distress.  EYES: Pupils equal, round, reactive to light and accommodation. No scleral icterus. Extraocular muscles intact.  HEENT: Head atraumatic, normocephalic. Oropharynx and nasopharynx clear.  NECK:  Supple, no jugular venous distention. No thyroid enlargement, no tenderness.  LUNGS: mod breath sounds bilaterally, min wheezing, no rales,rhonchi or crepitation. No use of accessory muscles of respiration.  CARDIOVASCULAR: S1, S2 normal. No murmurs, rubs, or gallops.  ABDOMEN: Soft, nontender, nondistended. Bowel sounds present. No organomegaly or mass.  EXTREMITIES: No  pedal edema, cyanosis, or clubbing.  NEUROLOGIC: Cranial nerves II through XII are intact. Muscle strength global weakness in all extremities. Sensation intact. Gait not checked.  PSYCHIATRIC: The patient is alert and oriented x 3.  SKIN: No obvious rash, lesion, or ulcer.    LABORATORY PANEL:   CBC Recent Labs  Lab 12/03/17 0557  WBC 24.8*  HGB 10.2*  HCT 29.9*  PLT 420   ------------------------------------------------------------------------------------------------------------------  Chemistries  Recent Labs  Lab 11/29/17 1829  12/03/17 0557  NA 137   < > 136  K 2.8*   < > 3.5  CL 102   < > 104  CO2 23   < > 25  GLUCOSE 83   < > 252*  BUN 12   < > 28*  CREATININE 0.75   < > 0.64  CALCIUM 7.7*   < > 8.1*  AST 63*  --   --   ALT 37  --   --   ALKPHOS 115  --   --   BILITOT 1.1  --   --    < > = values in this interval not displayed.   ------------------------------------------------------------------------------------------------------------------  Cardiac Enzymes Recent Labs  Lab 11/30/17 0509  TROPONINI 0.03*   ------------------------------------------------------------------------------------------------------------------  RADIOLOGY:  No results found.  EKG:   Orders placed or performed during the hospital encounter of 11/29/17  . EKG 12-Lead  . EKG 12-Lead  . ED EKG 12-Lead  . 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 Nicholos Johns has seen the patient recommending outpatient follow-up with interstitial lung disease clinic and advanced heart failure clinic and adjusted her not to move to [place] at this point -We will consider palliative care consult if no improvement  incentive  spirometry Multiple recent hospital stays and also on immunosuppressants Off bipap - taper IV steroids, cefepime -MRSA PCR is negative, blood cultures are negative so far.  Discontinue vancomycin - Scheduled Nebulizers, Inhalers -Wean O2 as tolerated, currently on 4 L of oxygen, chronically lives on 3 L of oxygen - pulmonary is seeing pt  -ABGs with hypoxia -Repeat chest x-ray pending  *Acute urinary retention Foley catheter placed, will do voiding trial  *Diabetes mellitus.  With hyperglycemia Tapering steroids  Add sliding scale insulin.  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  *Chronic diastolic congestive heart failure.  Fluid resuscitation for sepsis underway.  Monitor for fluid overload.   * weakness -PT -recommending skilled nursing facility, patient is refusing She wants to head back home in [place] on Sunday   All the records are reviewed and case discussed with Care Management/Social Workerr. Management plans discussed with the patient, family and they are in agreement.  CODE STATUS: fc  TOTAL TIME TAKING CARE OF THIS PATIENT:  33 minutes.   POSSIBLE D/C IN 2-3 DAYS, DEPENDING ON CLINICAL CONDITION.  Note: This dictation was prepared with Dragon dictation along with smaller phrase technology. Any transcriptional errors that result from this process are unintentional.   Ramonita Lab M.D on 12/04/2017 at 3:10 PM  Between 7am to 6pm - Pager - [phone] After 6pm go to [email or web address] - password [email or web address]  [facility]  Office  [phone]  CC: Primary care physician; Patient, No Pcp Per

## 2017-12-04 NOTE — Progress Notes (Signed)
Patient lying in bed resting and awakens to voice. When asked, patient states pain in left leg pain level-8. Patient on 4L of oxygen and is tolerating it without difficulty. External f/c intact. Will administer pain med per PRN order and will continue to monitor patient to end of shift.

## 2017-12-04 NOTE — Plan of Care (Signed)

## 2017-12-04 NOTE — Progress Notes (Signed)
Took over pt care at 1300, pt had lunch, resting quietly, voices needs

## 2017-12-05 LAB — BLOOD GAS, ARTERIAL
ACID-BASE DEFICIT: 1.3 mmol/L (ref 0.0–2.0)
BICARBONATE: 22.2 mmol/L (ref 20.0–28.0)
FIO2: 0.4
O2 SAT: 88.7 %
PCO2 ART: 32 mmHg (ref 32.0–48.0)
PO2 ART: 53 mmHg — AB (ref 83.0–108.0)
Patient temperature: 37
pH, Arterial: 7.45 (ref 7.350–7.450)

## 2017-12-05 LAB — CULTURE, BLOOD (ROUTINE X 2)
Culture: NO GROWTH
Special Requests: ADEQUATE

## 2017-12-05 LAB — GLUCOSE, CAPILLARY: GLUCOSE-CAPILLARY: 181 mg/dL — AB (ref 65–99)

## 2017-12-05 MED ORDER — FUROSEMIDE 40 MG PO TABS: 80.0000 mg | ORAL_TABLET | Freq: Every day | ORAL | Status: DC

## 2017-12-05 MED ORDER — INSULIN GLARGINE 300 UNIT/ML ~~LOC~~ SOPN: 30.0000 [IU] | PEN_INJECTOR | Freq: Every day | SUBCUTANEOUS | Status: DC

## 2017-12-05 MED ORDER — FENOFIBRATE 160 MG PO TABS
160.0000 mg | ORAL_TABLET | Freq: Every day | ORAL | Status: DC
  Administered 2017-12-05: 12:00:00 160 mg via ORAL
  Filled 2017-12-05: qty 1

## 2017-12-05 MED ORDER — BUDESONIDE 0.25 MG/2ML IN SUSP: 0.2500 mg | Freq: Two times a day (BID) | RESPIRATORY_TRACT | Status: DC

## 2017-12-05 MED ORDER — ATORVASTATIN CALCIUM 20 MG PO TABS: 80.0000 mg | ORAL_TABLET | Freq: Every day | ORAL | Status: DC

## 2017-12-05 MED ORDER — OXYCODONE HCL 5 MG PO TABS: 5.0000 mg | ORAL_TABLET | Freq: Four times a day (QID) | ORAL | Status: DC | PRN

## 2017-12-05 MED ORDER — ASPIRIN EC 81 MG PO TBEC
81.0000 mg | DELAYED_RELEASE_TABLET | Freq: Every day | ORAL | Status: DC
  Administered 2017-12-05: 12:00:00 81 mg via ORAL
  Filled 2017-12-05: qty 1

## 2017-12-05 MED ORDER — HEPARIN SOD (PORK) LOCK FLUSH 100 UNIT/ML IV SOLN
500.0000 [IU] | Freq: Once | INTRAVENOUS | Status: AC
  Administered 2017-12-05: 500 [IU] via INTRAVENOUS
  Filled 2017-12-05: qty 5

## 2017-12-05 MED ORDER — LIRAGLUTIDE 18 MG/3ML ~~LOC~~ SOPN: 1.2000 mg | PEN_INJECTOR | Freq: Every day | SUBCUTANEOUS | Status: DC

## 2017-12-05 MED ORDER — PREDNISONE 20 MG PO TABS: ORAL_TABLET | ORAL | 0 refills | Status: DC

## 2017-12-05 MED ORDER — PANTOPRAZOLE SODIUM 40 MG PO TBEC: 40.0000 mg | DELAYED_RELEASE_TABLET | Freq: Two times a day (BID) | ORAL | Status: DC

## 2017-12-05 MED ORDER — CEFDINIR 300 MG PO CAPS: 300.0000 mg | ORAL_CAPSULE | Freq: Two times a day (BID) | ORAL | 0 refills | Status: DC

## 2017-12-05 MED ORDER — METOCLOPRAMIDE HCL 5 MG PO TABS
5.0000 mg | ORAL_TABLET | Freq: Three times a day (TID) | ORAL | Status: DC
  Administered 2017-12-05: 5 mg via ORAL
  Filled 2017-12-05: qty 1

## 2017-12-05 MED ORDER — METOPROLOL TARTRATE 25 MG PO TABS
25.0000 mg | ORAL_TABLET | Freq: Two times a day (BID) | ORAL | Status: DC
  Administered 2017-12-05: 25 mg via ORAL
  Filled 2017-12-05: qty 1

## 2017-12-05 MED ORDER — INSULIN ASPART 100 UNIT/ML FLEXPEN: 25.0000 [IU] | PEN_INJECTOR | Freq: Three times a day (TID) | SUBCUTANEOUS | Status: DC

## 2017-12-05 MED ORDER — AMLODIPINE BESYLATE 10 MG PO TABS
10.0000 mg | ORAL_TABLET | Freq: Every day | ORAL | Status: DC
  Administered 2017-12-05: 10 mg via ORAL
  Filled 2017-12-05: qty 1

## 2017-12-05 MED ORDER — CEFDINIR 300 MG PO CAPS
300.0000 mg | ORAL_CAPSULE | Freq: Two times a day (BID) | ORAL | Status: DC
  Administered 2017-12-05: 12:00:00 300 mg via ORAL
  Filled 2017-12-05: qty 1

## 2017-12-05 MED ORDER — TIOTROPIUM BROMIDE MONOHYDRATE 18 MCG IN CAPS
18.0000 ug | ORAL_CAPSULE | Freq: Every day | RESPIRATORY_TRACT | Status: DC | PRN
  Filled 2017-12-05: qty 5

## 2017-12-05 NOTE — Care Management (Signed)
Per Kandee Keen with [facility] he received a referral for [facility] services.  Patient's sister is supposed to be picking patient up and taking her back to [facility] tomorrow.  I have asked MD to order home health services.  Cory with [facility] has been notified of patient's plans.  [facility] to follow up should patient stay in [place] and not go to [place].

## 2017-12-05 NOTE — Progress Notes (Signed)
Pt being discharged home with sister, pt with no complaints at discharge, discharge instructions and prescriptions reviewed with pt, states understanding, pt states that she does not have any reactions to heparin flush-hep flush administered and port a cath deaccessed, o2 in place, pt with no distress or discomfort noted

## 2017-12-05 NOTE — Discharge Summary (Signed)
Elizabeth Montes NAME: Elizabeth Montes    MR#:  [id number]  DATE OF BIRTH:  June 12, 1956  DATE OF ADMISSION:  11/29/2017 ADMITTING PHYSICIAN: Hillary Bow, MD  DATE OF DISCHARGE: 12/05/2017  PRIMARY CARE PHYSICIAN: Patient, No Pcp Per    ADMISSION DIAGNOSIS:  COPD exacerbation ([place]) [J44.1] Sepsis, due to unspecified organism ([place]) [A41.9]  DISCHARGE DIAGNOSIS:  acute on chronic hypoxic respiratory failure secondary to COPD exacerbation chronic pulmonary fibrosis on chronic 3 to 4 L nasal cannula oxygen pneumonia  SECONDARY DIAGNOSIS:   Past Medical History:  Diagnosis Date  . CAD (coronary artery disease) w/ stent 2017 ([place])   . CHF (congestive heart failure) ([place])   . COPD (chronic obstructive pulmonary disease) ([place])   . Diabetes mellitus without complication ([place])   . Heart attack [facility]) 06/09/2016   06/09/2016 cath/PCI (inf STEMI): 60-70% proximal LAD, LCx normal, RCA mid 80-90% -> Rebel 3.5 x 32 mm BMS to mRCA and Rebel 3.5 x 28 mm BMS to proximal LAD   . HLD (hyperlipidemia)   . Hypertension   . Obesity   . Polymyositis ([place])   . Pulmonary fibrosis ([place])   . Pulmonary hypertension [facility])     HOSPITAL COURSE:  Elizabeth Montes  is a 62 y.o. 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 Patient has been found to have fever, tachycardia. Chest x-ray shows bibasilar infiltrates  *Bibasilar pneumonia with sepsis andacute on chronic hypoxic respiratory failure from severe pulmonary fibrosis and the pulmonary hypertension likely from polymyositis  -Slowly clinically improving-- currently on 3 L [place] oxygen. Patient has intermittently saturations with exertion however she settles down with rest. - pulmonology Dr. Ashby Dawes has seen the patient recommending  outpatient follow-up with interstitial lung disease clinic and advanced heart failure clinic  - cont incentive spirometry --Off bipap - taper IV steroids, cefepime---change to oral steroid taper and then patient will continue her 20 mg after the taper is finished. -MRSA PCR is negative, blood cultures are negative so far.  Discontinue vancomycin - Scheduled Nebulizers, Inhalers -Wean O2 as tolerated, currently on 3 L of oxygen, chronically lives on 3 L of oxygen -ABGs with hypoxia  *Acute urinary retention Foley catheter placed, will do voiding trial--- DC catheter  *Diabetes mellitus.  With hyperglycemia Tapering steroids Add sliding scale insulin.  Continue home does insulin  *Polymyositis. On immunosuppressants at home which will be continued.  *Chronic diastolic congestive heart failure. Fluid resuscitation for sepsis underway. Monitor for fluid overload.  * weakness -PT -recommending skilled nursing facility, patient is refusing She wants to head back home in [place] on Sunday.  Patient reports hard sister is driving her. It's a 4 Hour Dr. to [place]. I recommended she takes stops in the middle to ambulate and make sure her breathing is better. I have also recommended her to follow up with a pulmonologist in [place]. She voiced understanding. Left message for patient's sister Ms. Haith on her voicemail. Unable to reach daughter phone is busy.  Patient will be discharged per her request. Appears this is her best at baseline at present.    CONSULTS OBTAINED:  Treatment Team:  Allyne Gee, MD  DRUG ALLERGIES:   Allergies  Allergen Reactions  . Lovenox [Enoxaparin Sodium] Other (See Comments)    "made me bleed"  DISCHARGE MEDICATIONS:   Allergies as of 12/05/2017      Reactions   Lovenox [enoxaparin Sodium] Other (See Comments)   "made me bleed"      Medication List    TAKE these medications   amLODipine 10 MG tablet Commonly known  as:  NORVASC Take 1 tablet (10 mg total) by mouth daily.   antiseptic oral rinse Liqd Take 15 mLs by mouth as needed for dry mouth.   aspirin 81 MG EC tablet Take 1 tablet (81 mg total) by mouth daily.   atorvastatin 80 MG tablet Commonly known as:  LIPITOR Take 80 mg by mouth daily at 6 PM.   blood glucose meter kit and supplies Dispense based on patient and insurance preference. Use up to four times daily as directed. (FOR ICD-10 E10.9, E11.9).   budesonide 0.25 MG/2ML nebulizer solution Commonly known as:  PULMICORT Take 2 mLs (0.25 mg total) by nebulization 2 (two) times daily.   cefdinir 300 MG capsule Commonly known as:  OMNICEF Take 1 capsule (300 mg total) by mouth every 12 (twelve) hours.   fenofibrate 160 MG tablet Take 1 tablet (160 mg total) by mouth daily.   furosemide 80 MG tablet Commonly known as:  LASIX Take 1 tablet (80 mg total) by mouth daily.   liraglutide 18 MG/3ML Sopn Commonly known as:  VICTOZA Inject 1.2 mg into the skin daily.   metoCLOPramide 10 MG tablet Commonly known as:  REGLAN Take 5 mg by mouth 3 (three) times daily before meals.   metoprolol tartrate 25 MG tablet Commonly known as:  LOPRESSOR Take 1 tablet (25 mg total) by mouth 2 (two) times daily.   NOVOLOG FLEXPEN 100 UNIT/ML FlexPen Generic drug:  insulin aspart Inject 25 Units into the skin 3 (three) times daily with meals.   oxyCODONE 5 MG immediate release tablet Commonly known as:  Oxy IR/ROXICODONE Take 5 mg by mouth every 6 (six) hours as needed (for pain).   pantoprazole 40 MG tablet Commonly known as:  PROTONIX Take 1 tablet (40 mg total) by mouth 2 (two) times daily before a meal.   Potassium Chloride ER 20 MEQ Tbcr Take 20 mEq by mouth daily.   predniSONE 20 MG tablet Commonly known as:  DELTASONE Take 50 mg daily taper by 10 mg down to 20 mg and then take that daily as before What changed:  additional instructions   SPIRIVA HANDIHALER 18 MCG inhalation  capsule Generic drug:  tiotropium Place 18 mcg into inhaler and inhale daily as needed (for wheezing).   TOUJEO SOLOSTAR 300 UNIT/ML Sopn Generic drug:  Insulin Glargine Inject 30 Units into the skin daily.       If you experience worsening of your admission symptoms, develop shortness of breath, life threatening emergency, suicidal or homicidal thoughts you must seek medical attention immediately by calling 911 or calling your MD immediately  if symptoms less severe.  You Must read complete instructions/literature along with all the possible adverse reactions/side effects for all the Medicines you take and that have been prescribed to you. Take any new Medicines after you have completely understood and accept all the possible adverse reactions/side effects.   Please note  You were cared for by a hospitalist during your hospital stay. If you have any questions about your discharge medications or the care you received while you were in the hospital after you are discharged, you can call the unit and asked to speak with the hospitalist on call if the  hospitalist that took care of you is not available. Once you are discharged, your primary care physician will handle any further medical issues. Please note that NO REFILLS for any discharge medications will be authorized once you are discharged, as it is imperative that you return to your primary care physician (or establish a relationship with a primary care physician if you do not have one) for your aftercare needs so that they can reassess your need for medications and monitor your lab values. Today   SUBJECTIVE   I feel a lot better. I want to go home so that I can go to [place]  VITAL SIGNS:  Blood pressure (!) 163/73, pulse 96, temperature 97.8 F (36.6 C), temperature source Oral, resp. rate 18, height _0  (1.6 m), weight 84.6 kg (186 lb 8 oz), SpO2 92 %.  I/O:    Intake/Output Summary (Last 24 hours) at 12/05/2017 1128 Last  data filed at 12/05/2017 1016 Gross per 24 hour  Intake 720 ml  Output 900 ml  Net -180 ml    PHYSICAL EXAMINATION:  GENERAL:  62 y.o.-year-old patient lying in the bed with no acute distress. obese EYES: Pupils equal, round, reactive to light and accommodation. No scleral icterus. Extraocular muscles intact.  HEENT: Head atraumatic, normocephalic. Oropharynx and nasopharynx clear.  NECK:  Supple, no jugular venous distention. No thyroid enlargement, no tenderness.  LUNGS: distant breath sounds bilaterally, no wheezing, rales,rhonchi or crepitation. No use of accessory muscles of respiration.  CARDIOVASCULAR: S1, S2 normal. No murmurs, rubs, or gallops.  ABDOMEN: Soft, non-tender, non-distended. Bowel sounds present. No organomegaly or mass.  EXTREMITIES: No pedal edema, cyanosis, or clubbing.  NEUROLOGIC: Cranial nerves II through XII are intact. Muscle strength 5/5 in all extremities. Sensation intact. Gait not checked.  PSYCHIATRIC: The patient is alert and oriented x 3.  SKIN: No obvious rash, lesion, or ulcer.   DATA REVIEW:   CBC  Recent Labs  Lab 12/03/17 0557  WBC 24.8*  HGB 10.2*  HCT 29.9*  PLT 420    Chemistries  Recent Labs  Lab 11/29/17 1829  12/03/17 0557  NA 137   < > 136  K 2.8*   < > 3.5  CL 102   < > 104  CO2 23   < > 25  GLUCOSE 83   < > 252*  BUN 12   < > 28*  CREATININE 0.75   < > 0.64  CALCIUM 7.7*   < > 8.1*  AST 63*  --   --   ALT 37  --   --   ALKPHOS 115  --   --   BILITOT 1.1  --   --    < > = values in this interval not displayed.    Microbiology Results   Recent Results (from the past 240 hour(s))  Blood Culture (routine x 2)     Status: None   Collection Time: 11/29/17  6:29 PM  Result Value Ref Range Status   Specimen Description BLOOD RIGHT ANTECUBITAL  Final   Special Requests   Final    BOTTLES DRAWN AEROBIC AND ANAEROBIC Blood Culture adequate volume   Culture   Final    NO GROWTH 5 DAYS Performed at [facility], [address], [place], [place] [address]    Report Status 12/04/2017 FINAL  Final  MRSA PCR Screening     Status: None   Collection Time: 11/29/17 10:46 PM  Result Value Ref Range Status  MRSA by PCR NEGATIVE NEGATIVE Final    Comment:        The GeneXpert MRSA Assay (FDA approved for NASAL specimens only), is one component of a comprehensive MRSA colonization surveillance program. It is not intended to diagnose MRSA infection nor to guide or monitor treatment for MRSA infections. Performed at [facility], [address] [place], [place] [address]   Blood Culture (routine x 2)     Status: None   Collection Time: 11/30/17  5:57 AM  Result Value Ref Range Status   Specimen Description BLOOD RIGHT HAND  Final   Special Requests   Final    BOTTLES DRAWN AEROBIC AND ANAEROBIC Blood Culture adequate volume   Culture   Final    NO GROWTH 5 DAYS Performed at [facility], [address] [place],  [address]    Report Status 12/05/2017 FINAL  Final    RADIOLOGY:  No results found.   Management plans discussed with the patient, family and they are in agreement.  CODE STATUS:     Code Status Orders  (From admission, onward)        Start     Ordered   11/29/17 2032  Full code  Continuous     11/29/17 2032    Code Status History    Date Active Date Inactive Code Status Order ID Comments User Context   11/01/2017 0239 11/04/2017 1804 Full Code [id number]  Elwyn Reach, MD ED   10/19/2017 0753 10/23/2017 1914 Full Code [id number]  Samella Parr, NP ED   09/30/2017 0831 10/05/2017 1525 Full Code [id number]  Lady Deutscher, MD ED   09/13/2017 0624 09/17/2017 1840 Full Code [id number]  Rise Patience, MD ED   08/11/2017 1002 08/12/2017 1621 Full Code [id number]  Roxan Hockey, MD ED   08/01/2017 1259 08/03/2017 1921 Full Code [id number]  Vashti Hey, MD ED   07/16/2017 0222 07/18/2017 1907 Full Code [id number]  Opyd, Ilene Qua,  MD ED      TOTAL TIME TAKING CARE OF THIS PATIENT: 40 minutes.    Fritzi Mandes M.D on 12/05/2017 at 11:28 AM  Between 7am to 6pm - Pager - [phone] After 6pm go to [email or web address] - password [email or web address] [facility]  Office  [phone]  CC: Primary care physician; Patient, No Pcp Per

## 2017-12-05 NOTE — Progress Notes (Signed)
PT Cancellation Note  Patient Details Name: Elizabeth Montes MRN: [id number] DOB: October 08, 1955   Cancelled Treatment:    Reason Eval/Treat Not Completed: Other (comment)   Chart reviewed and checked with pt this am.  Pt anticipates discharge today back to [place].  Pt stated she has been transferring to/from commode with nursing without difficulty and is confident in her abilities to transfer to/from car and wheelchair.  Declined session.  No further questions or concerns voiced.   Danielle Dess 12/05/2017, 9:31 AM

## 2017-12-05 NOTE — Discharge Instructions (Signed)
Patient advised to follow up with her pulmonologist when she returns back home in [place] she is recommended to take breaks during her travel. She is also recommended if she worsens with her shortness of breath go to the nearest emergency room. Continue oxygen and inhalers as before

## 2018-04-16 IMAGING — CT CT ANGIO CHEST
2 of 7 series · 18 of 46 positions shown · IV contrast (APPLIED)
Comparison: CT 10/20/2017 and CXR 10/31/2017

CLINICAL DATA: Dyspnea and back pain

EXAM:
CT ANGIOGRAPHY CHEST WITH CONTRAST
TECHNIQUE: Multidetector CT imaging of the chest was performed using the
standard protocol during bolus administration of intravenous
contrast. Multiplanar CT image reconstructions and MIPs were
obtained to evaluate the vascular anatomy.
CONTRAST:  48 cc Isovue 370 [id number] IOPAMIDOL ([id number])
INJECTION 76%

[Series 6: thins · axial · 0.72mm/px · z∈[+1014,+1250]mm · 15 of 377 slices shown]
[im 20/377  lung]
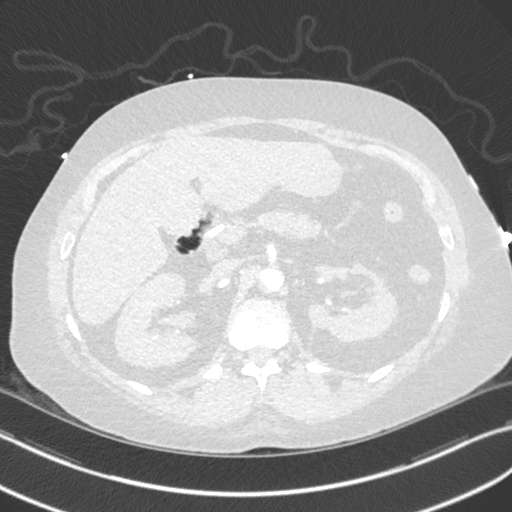
[im 40/377  soft-tissue]
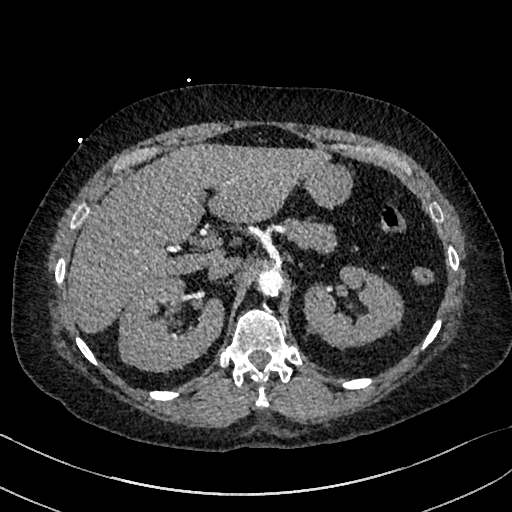
[im 80/377  lung]
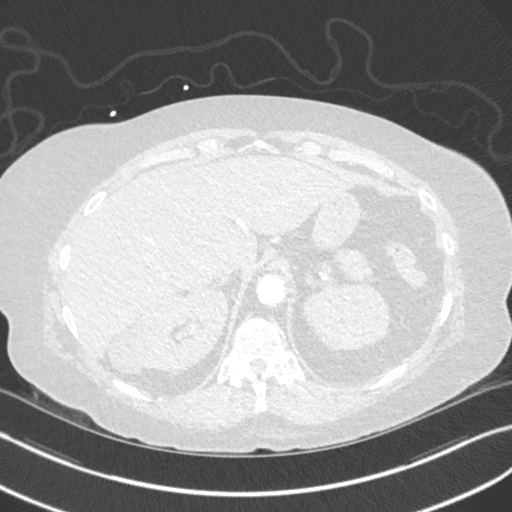
[im 99/377  soft-tissue]
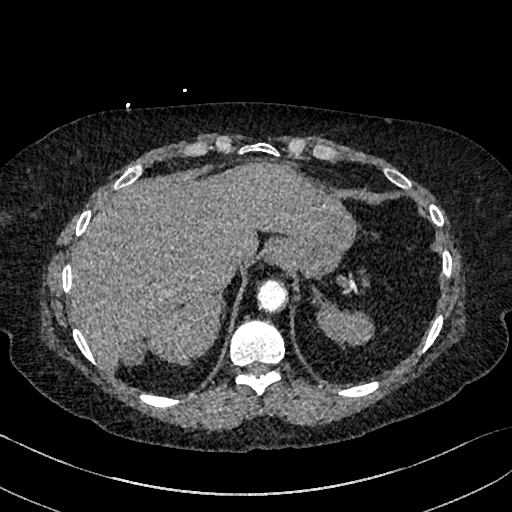
[im 119/377  lung]
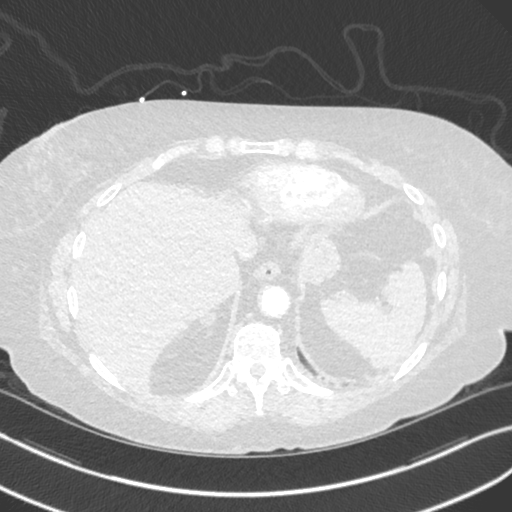
[im 139/377  soft-tissue]
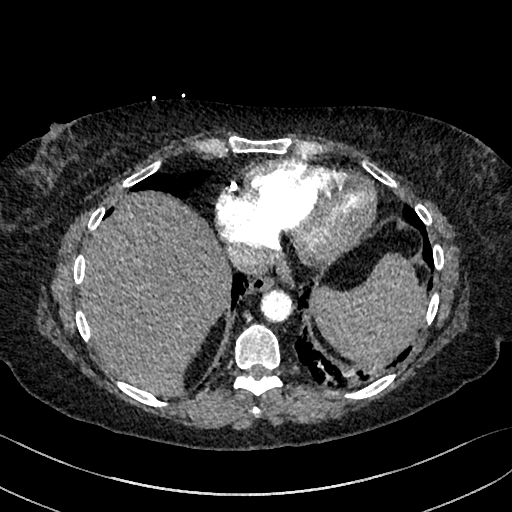
[im 159/377  lung]
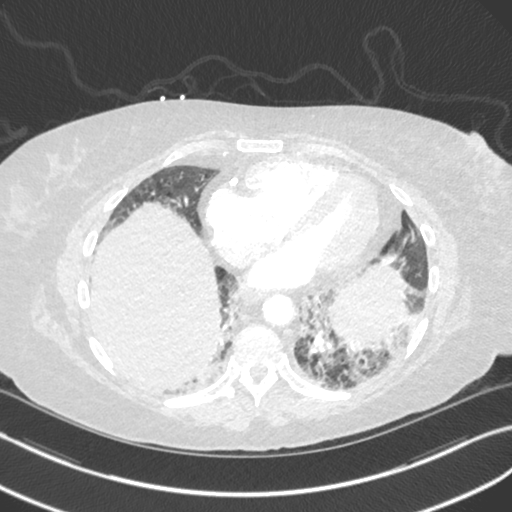
[im 198/377  soft-tissue]
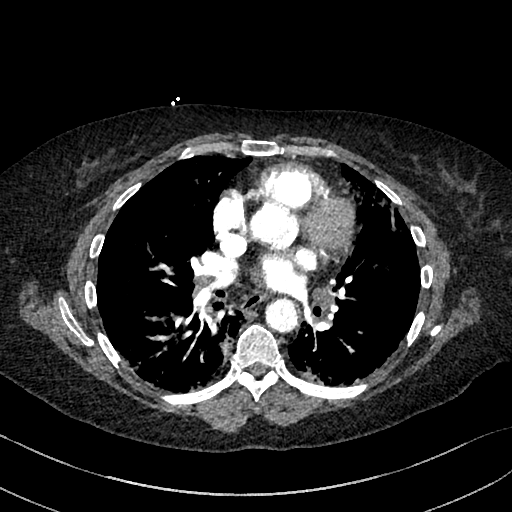
[im 218/377  lung]
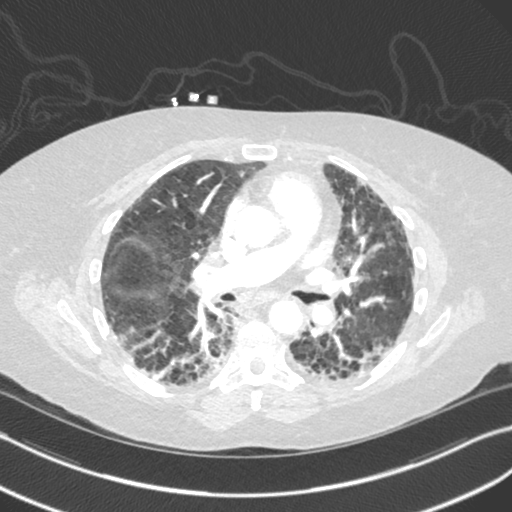
[im 238/377  soft-tissue]
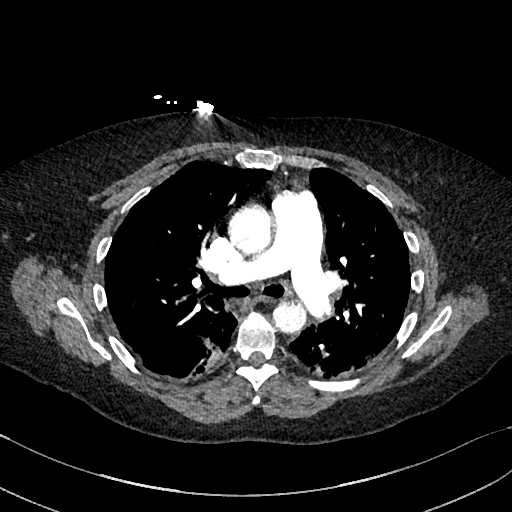
[im 258/377  lung]
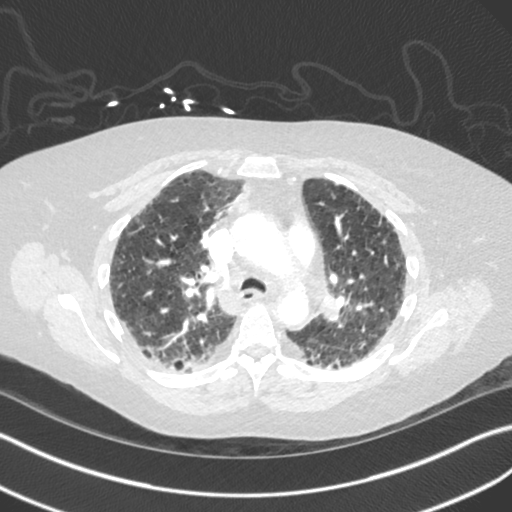
[im 278/377  soft-tissue]
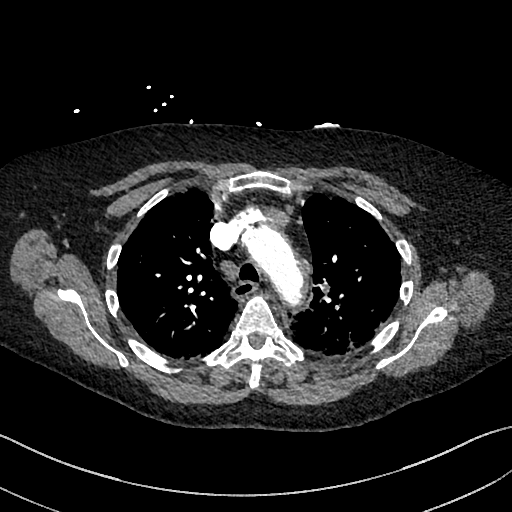
[im 317/377  lung]
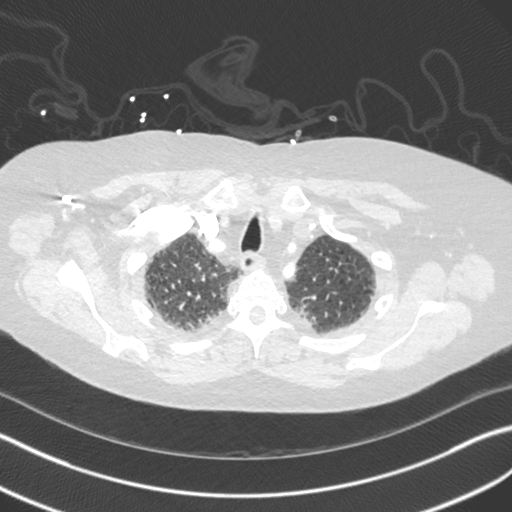
[im 337/377  soft-tissue]
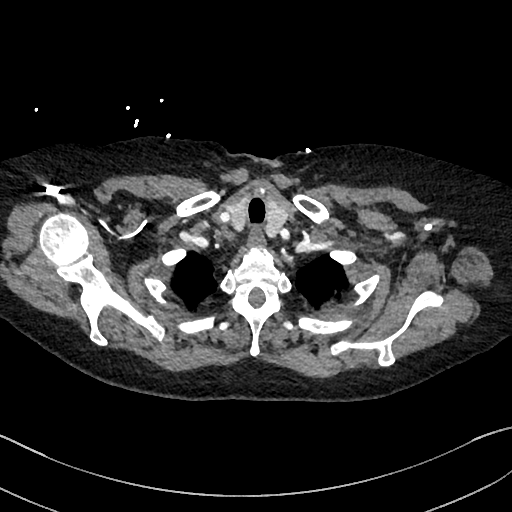
[im 357/377  lung]
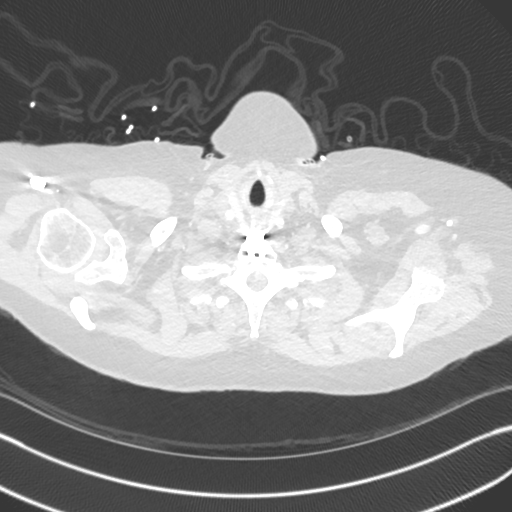

[Series 8: cor · coronal · 0.52mm/px · 3 of 115 slices shown]
[im 29/115  soft-tissue]
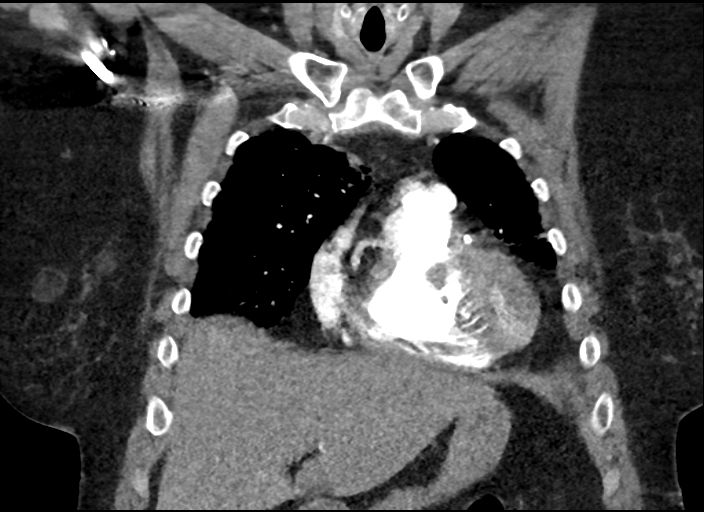
[im 58/115  soft-tissue]
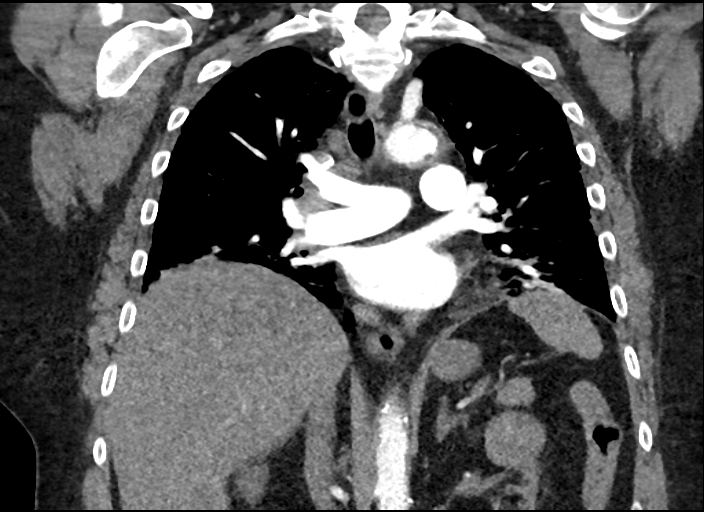
[im 86/115  soft-tissue]
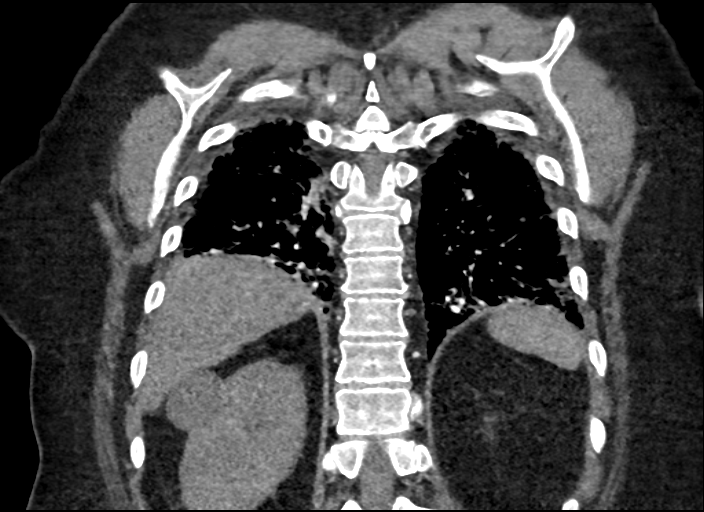

[18 of 46 positions shown; findings below may reference images not displayed]

FINDINGS: Cardiovascular: Normal heart size without pericardial effusion.
Three-vessel coronary arteriosclerosis. Mild-to-moderate aortic
atherosclerosis without dissection. No acute pulmonary embolus to
the segmental level.

Mediastinum/Nodes: No dominant mass of the thyroid gland. No
thyromegaly. Unremarkable CT appearance of the thoracic esophagus.
No axillary adenopathy. Stable 1.1 cm short axis right paratracheal
lymph node. Stable bilateral hilar adenopathy.

Lungs/Pleura: No pneumothorax. Bibasilar atelectasis. No pleural
effusion. Patchy subpleural and peribronchovascular reticulation and
ground-glass attenuation within both lungs as before with mild
traction bronchiectasis.

Upper Abdomen: Water attenuating cyst in the upper pole the right
kidney measuring approximate 3.1 cm.

Musculoskeletal: ACDF of the included lower cervical spine. Mild
thoracic spondylosis. No acute nor suspicious osseous lesions.

Review of the MIP images confirms the above findings.
IMPRESSION: 1. Three-vessel coronary arteriosclerosis and aortic
atherosclerosis.
2. No acute pulmonary embolus.
3. Basilar predominant interstitial lung disease, unchanged in
appearance compatible with nonspecific interstitial pneumonia.

Aortic Atherosclerosis ([id number].G).

## 2018-05-17 IMAGING — DX DG CHEST 1V PORT
1 series · 1 of 1 positions shown · non-contrast
Comparison: 11/29/2017

CLINICAL DATA: Pulmonary hypertension, pulmonary fibrosis,
polymyositis. Shortness of breath. Suspected pneumonia.

EXAM:
PORTABLE CHEST 1 VIEW

[chest ap]
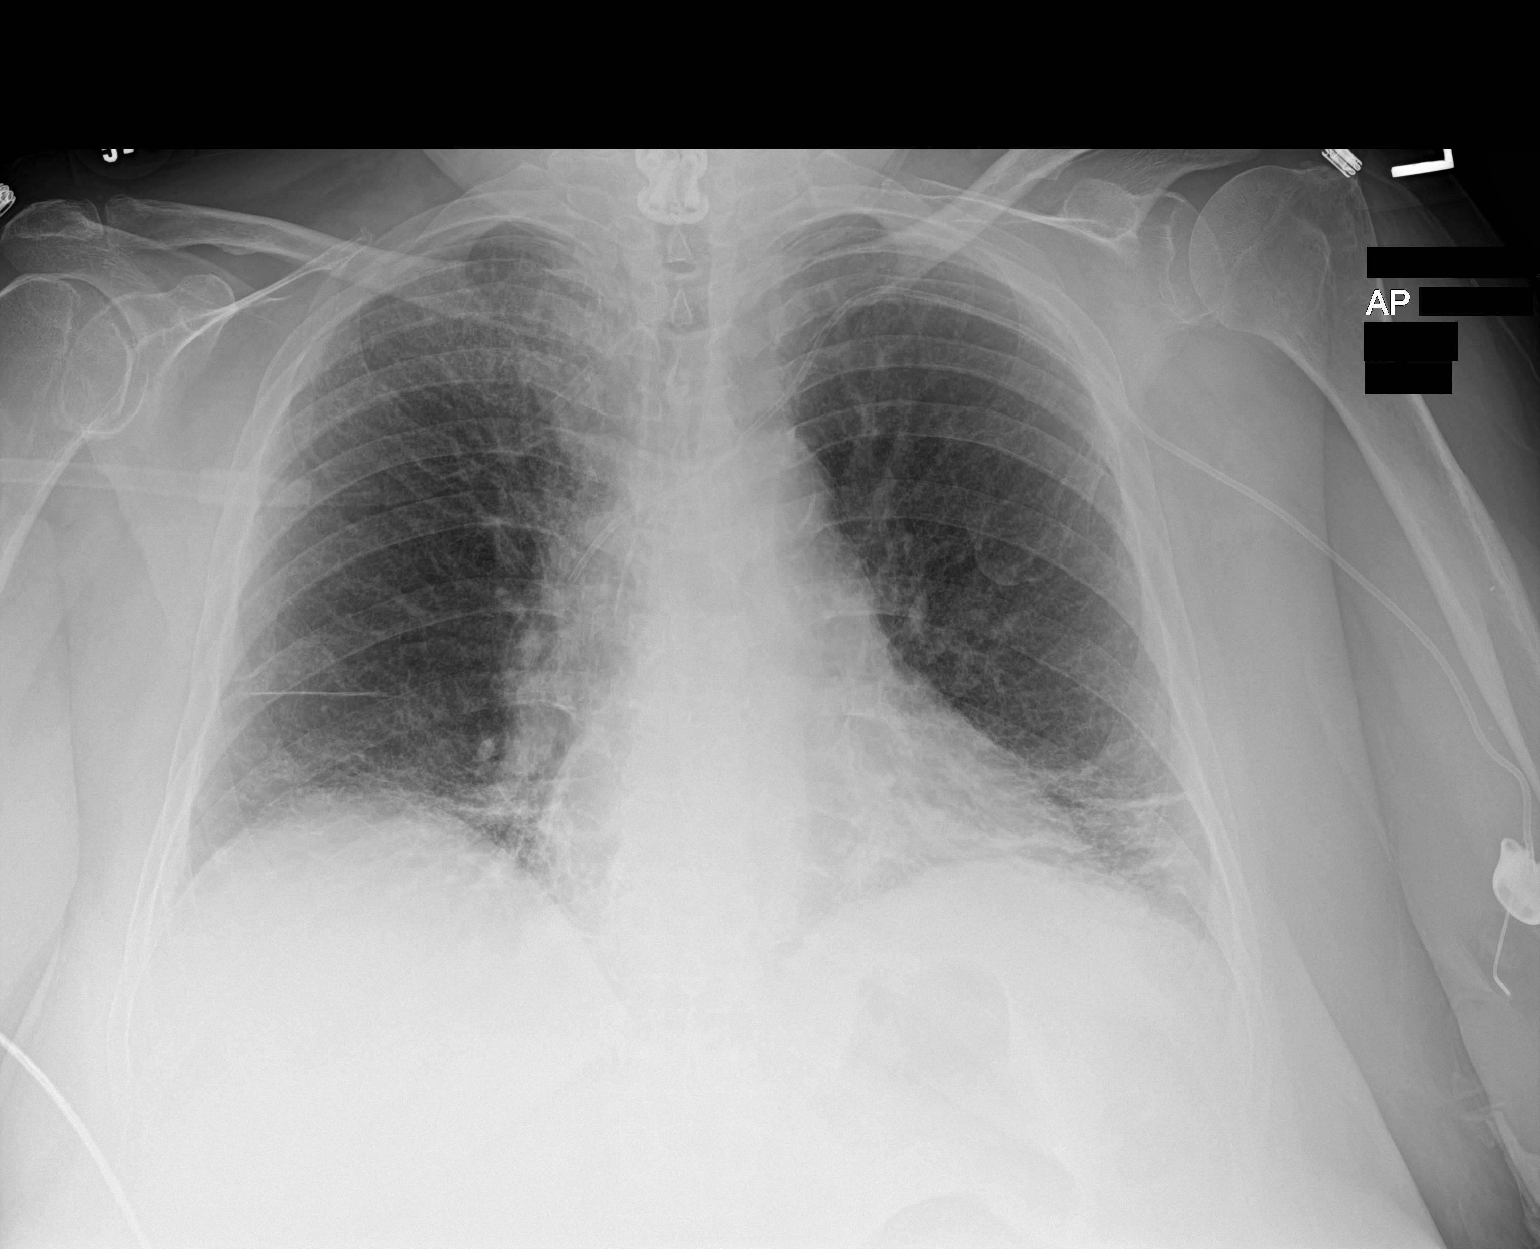

[1 of 1 positions shown; findings below may reference images not displayed]

FINDINGS: Left PICC line terminates in the expected location of the proximal
superior vena cava.

Cardiomediastinal silhouette is normal. Mediastinal contours appear
intact. Calcific atherosclerotic disease of the aorta.

There is no evidence of lobar airspace consolidation, pleural
effusion or pneumothorax. Persistent lower lobe predominant
interstitial lung disease and lung volumes.

Osseous structures are without acute abnormality. Soft tissues are
grossly normal.
IMPRESSION: Persistent lower lobe predominant interstitial lung disease and low
lung volumes.

No evidence of lobar consolidation.

## 2019-01-25 DEATH — deceased
